# Patient Record
Sex: Male | Born: 1945 | Race: White | Hispanic: No | Marital: Single | Smoking: Current every day smoker
Health system: Southern US, Community
[De-identification: ages and names within clinical notes are randomized; demographics above are authoritative.]

## PROBLEM LIST (undated history)

## (undated) DIAGNOSIS — K219 Gastro-esophageal reflux disease without esophagitis: Secondary | ICD-10-CM

## (undated) DIAGNOSIS — E785 Hyperlipidemia, unspecified: Secondary | ICD-10-CM

## (undated) DIAGNOSIS — I509 Heart failure, unspecified: Secondary | ICD-10-CM

## (undated) DIAGNOSIS — K259 Gastric ulcer, unspecified as acute or chronic, without hemorrhage or perforation: Secondary | ICD-10-CM

## (undated) DIAGNOSIS — I6529 Occlusion and stenosis of unspecified carotid artery: Secondary | ICD-10-CM

## (undated) DIAGNOSIS — K279 Peptic ulcer, site unspecified, unspecified as acute or chronic, without hemorrhage or perforation: Secondary | ICD-10-CM

## (undated) DIAGNOSIS — J302 Other seasonal allergic rhinitis: Secondary | ICD-10-CM

## (undated) DIAGNOSIS — E119 Type 2 diabetes mellitus without complications: Secondary | ICD-10-CM

## (undated) DIAGNOSIS — I2510 Atherosclerotic heart disease of native coronary artery without angina pectoris: Secondary | ICD-10-CM

## (undated) DIAGNOSIS — M1990 Unspecified osteoarthritis, unspecified site: Secondary | ICD-10-CM

## (undated) DIAGNOSIS — I10 Essential (primary) hypertension: Secondary | ICD-10-CM

## (undated) DIAGNOSIS — F1911 Other psychoactive substance abuse, in remission: Secondary | ICD-10-CM

## (undated) HISTORY — PX: FEMUR FRACTURE SURGERY: SHX633

## (undated) HISTORY — PX: ANTRECTOMY: SHX5722

## (undated) HISTORY — DX: Occlusion and stenosis of unspecified carotid artery: I65.29

## (undated) HISTORY — PX: FRACTURE SURGERY: SHX138

---

## 1970-11-30 DIAGNOSIS — K259 Gastric ulcer, unspecified as acute or chronic, without hemorrhage or perforation: Secondary | ICD-10-CM

## 1970-11-30 HISTORY — DX: Gastric ulcer, unspecified as acute or chronic, without hemorrhage or perforation: K25.9

## 2000-11-30 HISTORY — PX: CORONARY ARTERY BYPASS GRAFT: SHX141

## 2001-04-20 ENCOUNTER — Encounter: Payer: Self-pay | Admitting: Internal Medicine

## 2001-04-20 ENCOUNTER — Inpatient Hospital Stay (HOSPITAL_COMMUNITY): Admission: RE | Admit: 2001-04-20 | Discharge: 2001-04-25 | Payer: Self-pay | Admitting: Internal Medicine

## 2001-04-21 ENCOUNTER — Encounter: Payer: Self-pay | Admitting: Thoracic Surgery (Cardiothoracic Vascular Surgery)

## 2001-04-22 ENCOUNTER — Encounter: Payer: Self-pay | Admitting: Thoracic Surgery (Cardiothoracic Vascular Surgery)

## 2001-04-23 ENCOUNTER — Encounter: Payer: Self-pay | Admitting: Thoracic Surgery (Cardiothoracic Vascular Surgery)

## 2004-11-26 ENCOUNTER — Ambulatory Visit: Payer: Self-pay | Admitting: Family Medicine

## 2004-11-28 ENCOUNTER — Emergency Department (HOSPITAL_COMMUNITY): Admission: EM | Admit: 2004-11-28 | Discharge: 2004-11-28 | Payer: Self-pay | Admitting: Emergency Medicine

## 2006-04-28 ENCOUNTER — Ambulatory Visit: Payer: Self-pay | Admitting: Family Medicine

## 2007-03-16 ENCOUNTER — Ambulatory Visit: Payer: Self-pay | Admitting: Family Medicine

## 2010-06-30 HISTORY — PX: CHOLECYSTECTOMY: SHX55

## 2010-07-20 ENCOUNTER — Encounter (INDEPENDENT_AMBULATORY_CARE_PROVIDER_SITE_OTHER): Payer: Self-pay | Admitting: General Surgery

## 2010-07-20 ENCOUNTER — Inpatient Hospital Stay (HOSPITAL_COMMUNITY)
Admission: EM | Admit: 2010-07-20 | Discharge: 2010-07-21 | Payer: Self-pay | Source: Home / Self Care | Admitting: Emergency Medicine

## 2011-02-12 LAB — CBC
MCHC: 34.5 g/dL (ref 30.0–36.0)
MCV: 86.4 fL (ref 78.0–100.0)
Platelets: 172 10*3/uL (ref 150–400)
RBC: 5.37 MIL/uL (ref 4.22–5.81)
RDW: 15 % (ref 11.5–15.5)

## 2011-02-12 LAB — COMPREHENSIVE METABOLIC PANEL
ALT: 39 U/L (ref 0–53)
Albumin: 3.6 g/dL (ref 3.5–5.2)
BUN: 12 mg/dL (ref 6–23)
Calcium: 9.2 mg/dL (ref 8.4–10.5)
Glucose, Bld: 131 mg/dL — ABNORMAL HIGH (ref 70–99)
Potassium: 4 mEq/L (ref 3.5–5.1)
Sodium: 139 mEq/L (ref 135–145)
Total Bilirubin: 1 mg/dL (ref 0.3–1.2)
Total Protein: 6.3 g/dL (ref 6.0–8.3)

## 2011-02-12 LAB — URINALYSIS, ROUTINE W REFLEX MICROSCOPIC
Nitrite: NEGATIVE
Specific Gravity, Urine: 1.019 (ref 1.005–1.030)
pH: 6 (ref 5.0–8.0)

## 2011-02-12 LAB — DIFFERENTIAL
Lymphs Abs: 0.9 10*3/uL (ref 0.7–4.0)
Monocytes Relative: 5 % (ref 3–12)

## 2011-02-12 LAB — GLUCOSE, CAPILLARY
Glucose-Capillary: 146 mg/dL — ABNORMAL HIGH (ref 70–99)
Glucose-Capillary: 198 mg/dL — ABNORMAL HIGH (ref 70–99)

## 2011-04-17 NOTE — Discharge Summary (Signed)
[place]. [facility]  Patient:    Aaron Santiago, Aaron Santiago                       MRN: [id number] Adm. Date:  [id number] Disc. Date: [id number] Attending:  Tressie Stalker Dictator:   Maxwell Marion, RNFA CC:         Aaron Santiago, D.O.  Aaron Santiago, M.D. [facility]   Discharge Summary  DATE OF BIRTH:  December 30, 1945.  On the morning of Apr 24, 2001, Aaron Santiago temperature was noted to be 101.2.  The discharge was held due to his fever.  A urinalysis sample was obtained. The result was negative.  After examination by Alleen Borne, M.D., he felt his fever was probably due to pericarditis.  He had a pleura postoperatively with ST elevations.  No rub was heard on Apr 24, 2001.  He is planning on a short course of Indocin, that will be for three days; and plan is for discharge home on Apr 25, 2001. The morning of Apr 25, 2001, he is noted to be afebrile at 97.5.  PHYSICAL EXAMINATION:  His heart is regular rate and rhythm.  His lungs are clear.  His incision is healing well.  Postoperative instructions were reviewed including discussions about smoking cessation.  He was discharged home on Apr 25, 2001. DD:  04/25/01 TD:  04/25/01 Job: [id number]

## 2011-04-17 NOTE — Cardiovascular Report (Signed)
[place]. [facility]  Patient:    Aaron Santiago, Aaron Santiago                       MRN: [id number] Proc. Date: 04/20/01 Adm. Date:  [id number] Attending:  Lewayne Bunting CC:         Doylene Canning. Ladona Ridgel, M.D. [facility]  Colon Flattery, D.O.  Cardiac Catheterization Laboratory   Cardiac Catheterization  INDICATIONS:  Aaron Santiago is a 65 year old male who presents with onset of exertion-type chest discomfort.  The symptoms are fairly typical.  The patient has insulin dependent diabetes.  The current study is done to assess coronary anatomy.  PROCEDURES: 1. Left heart catheterization. 2. Selective coronary arteriography. 3. Selective left ventriculography. 4. Subclavian angiography.  DESCRIPTION OF PROCEDURE:  The procedure was performed from the right femoral artery using 6-French catheters.  He tolerated the procedure well without complications.  HEMODYNAMIC DATA:  The central aortic pressure is 133/76.  LV pressure is 127/10.  There was no gradient on pullback across the aortic valve.  ANGIOGRAPHIC DATA: 1. Ventriculography was performed in the RAO projection.  No segmental    abnormality or contraction were identified.  There was a lot of ventricular    ectopy so the ejection fraction could not be calculated, but it would    appear to be greater than 55%. 2. Subclavian angiography reveals a widely patent subclavian.  The internal    mammary is also patent.  Of note, there does appear to be tight narrowing    of the vertebral of perhaps 90%. 3. The left main coronary artery is free of critical disease. 4. The LAD demonstrates calcification in the mid portion.  There is about    30% tapered narrowing at the bifurcation of the diagonal and just beyond    this there is a 75% to 80% segmental plaquing near the area of    calcification.  This is somewhat segmental.  The distal LAD is a large    caliber vessel, being about 3 mm, and it wraps the apex.  The diagonal    itself has  some mild irregularity, but no high-grade stenosis. 5. The circumflex proper provides two marginal branches.  There is perhaps    minimal luminal irregularity, but no high-grade disease. 6. The right coronary artery demonstrates about 30% to 40% narrowing in the    mid bend.  There is also perhaps 30% narrowing proximally.  Neither of    these appear to be high-grade.  The posterior descending and posterolateral    branches are free of critical disease.  CONCLUSIONS: 1. Normal left ventricular function. 2. Patency of the subclavian and internal mammary artery with stenosis of    the vertebral artery, as noted. 3. Moderately high-grade stenosis and segmental narrowing of the left anterior    descending coronary artery, as noted above.  DISPOSITION:  The LAD itself is not ideal for stenting.  It overlaps the diagonal branch.  The likelihood of diagonal occlusion would be somewhat increased.  This could be approached with ablative technique including rotational atherectomy, but the risk of target vessel revascularization would be high and the distal LAD is a very large vessel and in an insulin dependent diabetic.  Because of the increased risk of target vessel revascularization, some consideration might be given towards minimally invasive surgery.  I will discuss the RCA disease with the surgeons. DD:  04/20/01 TD:  04/20/01 Job: [id number]

## 2011-04-17 NOTE — Op Note (Signed)
[place]. [facility]  Patient:    Aaron Santiago, Aaron Santiago                       MRN: [id number] Proc. Date: 04/21/01 Adm. Date:  [id number] Attending:  Tressie Stalker CC:         Arturo Morton. Riley Kill, M.D. [facility]  Daisey Must, M.D. [facility]  Colon Flattery, D.O.  CVTS Office   Operative Report  PREOPERATIVE DIAGNOSIS:  Severe one vessel coronary artery disease with class III unstable angina.  POSTOPERATIVE DIAGNOSIS:  Severe one vessel coronary artery disease with class III unstable angina.  OPERATION:  Median sternotomy for off-pump coronary artery bypass times one (left internal mammary artery to distal left anterior descending coronary artery).  SURGEON:  Salvatore Decent. Cornelius Moras, M.D.  ASSISTANT:  Mikey Bussing, M.D.  ANESTHESIA:  General  BRIEF CLINICAL NOTE:  The patient is a 65 year old white male with type 2 diabetes mellitus followed by Dr. Colon Flattery and referred by Dr. Shawnie Pons for management of coronary artery disease.  The patient presents with new onset symptoms of angina and cardiac catheterization demonstrated severe one vessel coronary artery disease with normal left ventricular function. There is high stenosis of the left anterior descending coronary artery involving a complicated calcified plaque immediately after take off of the large diagonal branch.  This is felt to be not optimal for percutaneous based therapy.  There is insignificant nonflow limiting disease in the right coronary and left circumflex system.  A full consultation note has been dictated previously.  OPERATIVE CONSENT:  The patient and his family are counselled at length regarding the indications and potential benefits of coronary artery bypass grafting.  They understand the associated risks of surgery including but not limited to risks of death, stroke, myocardial infarction, bleeding requiring blood transfusion, arrhythmia, infection, and recurrent coronary  artery disease.  They accept these risks as well as unforeseen complications and desire to proceed with surgery as described.  DESCRIPTION OF PROCEDURE:  The patient is brought to the operating room on the above mentioned date and invasive hemodynamic monitoring is established by the anesthesia service under the care and direction of Dr. Edwin Cap. Zoila Shutter. Intravenous antibiotics are administered.  Following induction with general endotracheal anesthesia, the patients chest, abdomen, both groins, and both lower extremities are prepared and draped in a sterile manner.  A median sternotomy incision is performed and the left internal mammary artery is dissected from the chest wall and prepared for bypass grafting.  The left internal mammary artery is felt to be a good quality conduit.  The patient is heparinized systemically.  The pericardium is opened.  The patient is placed in steep Trendelenburg position with the table turned towards the patients right side.  Two deep pericardial sutures are placed along the pericardial reflection on the left side to elevate the heart into the operative field.  The left anterior descending coronary artery is brought clearly into view.  A stabilization device ([employer]) is utilized to facilitate beating heart surgery.  The left internal mammary artery is trimmed to appropriate length.  The stabilization device is positioned appropriately and the patient tolerates it well with insignificant changes in hemodynamics.  Distal anastomosis is performed in an end to side fashion between the left internal mammary artery and the distal left anterior descending coronary artery.  This coronary measures 2 mm in diameter and is of good quality.  The anastomosis is completed uneventfully. Following completion of  the anastomosis the stabilization device is removed. A sterile Doppler device is utilized to verify excellent flow within the left internal mammary artery  graft.  Protamine is administered to reverse the anticoagulation.  The mediastinum and the left chest are irrigated with saline solution containing Vancomycin. Meticulous surgical hemostasis is ascertained.  The mediastinum and the left chest are drained with two chest tubes placed through separate stab incisions inferiorly.  The median sternotomy is closed in a routine fashion.  The skin incision is closed with a subcuticular skin closure.  The patient tolerated the procedure well and was transported to the surgical intensive care unit in stable condition.  There were no intraoperative complications.  All sponge, instrument and needle counts are verified correct at the completion of the operation.  No autologous blood products were administered. DD:  04/21/01 TD:  04/22/01 Job: [id number]
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
## 2011-10-31 DIAGNOSIS — I6529 Occlusion and stenosis of unspecified carotid artery: Secondary | ICD-10-CM | POA: Insufficient documentation

## 2011-11-05 ENCOUNTER — Inpatient Hospital Stay (HOSPITAL_COMMUNITY)
Admission: EM | Admit: 2011-11-05 | Discharge: 2011-11-07 | DRG: 247 | Disposition: A | Discharge status: Home / Self Care | Payer: Medicare Other | Attending: Cardiology | Admitting: Cardiology

## 2011-11-05 ENCOUNTER — Emergency Department (HOSPITAL_COMMUNITY): Payer: Medicare Other

## 2011-11-05 ENCOUNTER — Other Ambulatory Visit: Payer: Self-pay

## 2011-11-05 ENCOUNTER — Encounter: Payer: Self-pay | Admitting: Emergency Medicine

## 2011-11-05 DIAGNOSIS — Z823 Family history of stroke: Secondary | ICD-10-CM

## 2011-11-05 DIAGNOSIS — I4729 Other ventricular tachycardia: Secondary | ICD-10-CM | POA: Diagnosis not present

## 2011-11-05 DIAGNOSIS — F1411 Cocaine abuse, in remission: Secondary | ICD-10-CM | POA: Diagnosis present

## 2011-11-05 DIAGNOSIS — Z720 Tobacco use: Secondary | ICD-10-CM | POA: Diagnosis present

## 2011-11-05 DIAGNOSIS — I472 Ventricular tachycardia, unspecified: Secondary | ICD-10-CM | POA: Diagnosis not present

## 2011-11-05 DIAGNOSIS — Z794 Long term (current) use of insulin: Secondary | ICD-10-CM | POA: Diagnosis present

## 2011-11-05 DIAGNOSIS — I200 Unstable angina: Secondary | ICD-10-CM | POA: Diagnosis present

## 2011-11-05 DIAGNOSIS — I169 Hypertensive crisis, unspecified: Secondary | ICD-10-CM

## 2011-11-05 DIAGNOSIS — E7800 Pure hypercholesterolemia, unspecified: Secondary | ICD-10-CM | POA: Diagnosis present

## 2011-11-05 DIAGNOSIS — Z7982 Long term (current) use of aspirin: Secondary | ICD-10-CM

## 2011-11-05 DIAGNOSIS — K279 Peptic ulcer, site unspecified, unspecified as acute or chronic, without hemorrhage or perforation: Secondary | ICD-10-CM | POA: Diagnosis present

## 2011-11-05 DIAGNOSIS — E785 Hyperlipidemia, unspecified: Secondary | ICD-10-CM | POA: Diagnosis present

## 2011-11-05 DIAGNOSIS — Z951 Presence of aortocoronary bypass graft: Secondary | ICD-10-CM

## 2011-11-05 DIAGNOSIS — Z8249 Family history of ischemic heart disease and other diseases of the circulatory system: Secondary | ICD-10-CM

## 2011-11-05 DIAGNOSIS — F1011 Alcohol abuse, in remission: Secondary | ICD-10-CM | POA: Diagnosis present

## 2011-11-05 DIAGNOSIS — I25110 Atherosclerotic heart disease of native coronary artery with unstable angina pectoris: Secondary | ICD-10-CM | POA: Diagnosis present

## 2011-11-05 DIAGNOSIS — I119 Hypertensive heart disease without heart failure: Secondary | ICD-10-CM | POA: Diagnosis present

## 2011-11-05 DIAGNOSIS — E119 Type 2 diabetes mellitus without complications: Secondary | ICD-10-CM | POA: Diagnosis present

## 2011-11-05 DIAGNOSIS — R079 Chest pain, unspecified: Secondary | ICD-10-CM | POA: Diagnosis present

## 2011-11-05 DIAGNOSIS — Z79899 Other long term (current) drug therapy: Secondary | ICD-10-CM

## 2011-11-05 DIAGNOSIS — F17200 Nicotine dependence, unspecified, uncomplicated: Secondary | ICD-10-CM | POA: Diagnosis present

## 2011-11-05 DIAGNOSIS — I2510 Atherosclerotic heart disease of native coronary artery without angina pectoris: Principal | ICD-10-CM | POA: Diagnosis present

## 2011-11-05 DIAGNOSIS — Z7902 Long term (current) use of antithrombotics/antiplatelets: Secondary | ICD-10-CM

## 2011-11-05 DIAGNOSIS — E1111 Type 2 diabetes mellitus with ketoacidosis with coma: Secondary | ICD-10-CM | POA: Diagnosis present

## 2011-11-05 DIAGNOSIS — I10 Essential (primary) hypertension: Secondary | ICD-10-CM | POA: Diagnosis present

## 2011-11-05 DIAGNOSIS — J069 Acute upper respiratory infection, unspecified: Secondary | ICD-10-CM | POA: Diagnosis present

## 2011-11-05 HISTORY — DX: Gastro-esophageal reflux disease without esophagitis: K21.9

## 2011-11-05 HISTORY — DX: Unspecified osteoarthritis, unspecified site: M19.90

## 2011-11-05 HISTORY — DX: Gastric ulcer, unspecified as acute or chronic, without hemorrhage or perforation: K25.9

## 2011-11-05 HISTORY — DX: Other psychoactive substance abuse, in remission: F19.11

## 2011-11-05 HISTORY — DX: Peptic ulcer, site unspecified, unspecified as acute or chronic, without hemorrhage or perforation: K27.9

## 2011-11-05 LAB — CARDIAC PANEL(CRET KIN+CKTOT+MB+TROPI)
Relative Index: 3.6 — ABNORMAL HIGH (ref 0.0–2.5)
Relative Index: INVALID (ref 0.0–2.5)
Total CK: 138 U/L (ref 7–232)
Total CK: 90 U/L (ref 7–232)
Troponin I: <0.3 ng/mL (ref <0.30)

## 2011-11-05 LAB — DIFFERENTIAL
Eosinophils Absolute: 0.2 10*3/uL (ref 0.0–0.7)
Lymphocytes Relative: 21 % (ref 12–46)
Lymphs Abs: 1.4 10*3/uL (ref 0.7–4.0)
Monocytes Absolute: 0.6 10*3/uL (ref 0.1–1.0)
Neutro Abs: 4.3 10*3/uL (ref 1.7–7.7)
Neutrophils Relative %: 66 % (ref 43–77)

## 2011-11-05 LAB — COMPREHENSIVE METABOLIC PANEL
Albumin: 3.6 g/dL (ref 3.5–5.2)
BUN: 11 mg/dL (ref 6–23)
CO2: 28 mEq/L (ref 19–32)
Calcium: 9.2 mg/dL (ref 8.4–10.5)
GFR calc non Af Amer: 88 mL/min — ABNORMAL LOW (ref >90)
Potassium: 3.8 mEq/L (ref 3.5–5.1)
Sodium: 135 mEq/L (ref 135–145)
Total Bilirubin: 0.5 mg/dL (ref 0.3–1.2)

## 2011-11-05 LAB — URINALYSIS, ROUTINE W REFLEX MICROSCOPIC
Bilirubin Urine: NEGATIVE
Glucose, UA: NEGATIVE mg/dL
Protein, ur: NEGATIVE mg/dL
Urobilinogen, UA: 0.2 mg/dL (ref 0.0–1.0)
pH: 7 (ref 5.0–8.0)

## 2011-11-05 LAB — POCT I-STAT TROPONIN I

## 2011-11-05 LAB — CBC
HCT: 45.9 % (ref 39.0–52.0)
MCV: 84.5 fL (ref 78.0–100.0)
Platelets: 178 10*3/uL (ref 150–400)
RBC: 5.43 MIL/uL (ref 4.22–5.81)
RDW: 14.3 % (ref 11.5–15.5)
WBC: 6.6 10*3/uL (ref 4.0–10.5)

## 2011-11-05 LAB — GLUCOSE, CAPILLARY
Glucose-Capillary: 318 mg/dL — ABNORMAL HIGH (ref 70–99)
Glucose-Capillary: 304 mg/dL — ABNORMAL HIGH (ref 70–99)

## 2011-11-05 LAB — PROTIME-INR
INR: 1.08 (ref 0.00–1.49)
Prothrombin Time: 14.2 seconds (ref 11.6–15.2)

## 2011-11-05 MED ORDER — ALPRAZOLAM 0.25 MG PO TABS
0.2500 mg | ORAL_TABLET | Freq: Two times a day (BID) | ORAL | Status: DC | PRN
  Administered 2011-11-07: 0.25 mg via ORAL
  Filled 2011-11-05: qty 1

## 2011-11-05 MED ORDER — METOPROLOL TARTRATE 25 MG PO TABS
25.0000 mg | ORAL_TABLET | Freq: Four times a day (QID) | ORAL | Status: DC
  Administered 2011-11-05 – 2011-11-07 (×5): 25 mg via ORAL
  Filled 2011-11-05 (×13): qty 1

## 2011-11-05 MED ORDER — NITROGLYCERIN 0.4 MG SL SUBL: 0.4000 mg | SUBLINGUAL_TABLET | SUBLINGUAL | Status: DC | PRN

## 2011-11-05 MED ORDER — LISINOPRIL 10 MG PO TABS
10.0000 mg | ORAL_TABLET | Freq: Every day | ORAL | Status: DC
  Administered 2011-11-05 – 2011-11-07 (×3): 10 mg via ORAL
  Filled 2011-11-05 (×3): qty 1

## 2011-11-05 MED ORDER — SODIUM CHLORIDE 0.9 % IJ SOLN
3.0000 mL | Freq: Two times a day (BID) | INTRAMUSCULAR | Status: DC
  Administered 2011-11-05: 3 mL via INTRAVENOUS

## 2011-11-05 MED ORDER — SODIUM CHLORIDE 0.9 % IV SOLN: INTRAVENOUS | Status: DC

## 2011-11-05 MED ORDER — INSULIN DETEMIR 100 UNIT/ML ~~LOC~~ SOLN
30.0000 [IU] | Freq: Every day | SUBCUTANEOUS | Status: DC
  Administered 2011-11-07: 30 [IU] via SUBCUTANEOUS
  Filled 2011-11-05: qty 3

## 2011-11-05 MED ORDER — OMEGA-3 FATTY ACIDS 1000 MG PO CAPS
2.0000 g | ORAL_CAPSULE | Freq: Every day | ORAL | Status: DC
  Administered 2011-11-05 – 2011-11-07 (×2): 2 g via ORAL
  Filled 2011-11-05 (×3): qty 2

## 2011-11-05 MED ORDER — VITAMIN E 45 MG (100 UNIT) PO CAPS
1000.0000 [IU] | ORAL_CAPSULE | Freq: Every day | ORAL | Status: DC
  Administered 2011-11-05 – 2011-11-07 (×2): 1000 [IU] via ORAL
  Filled 2011-11-05 (×3): qty 2

## 2011-11-05 MED ORDER — VITAMIN B-1 100 MG PO TABS
100.0000 mg | ORAL_TABLET | Freq: Every day | ORAL | Status: DC
  Administered 2011-11-05 – 2011-11-07 (×2): 100 mg via ORAL
  Filled 2011-11-05 (×3): qty 1

## 2011-11-05 MED ORDER — INSULIN ASPART 100 UNIT/ML ~~LOC~~ SOLN
0.0000 [IU] | Freq: Every day | SUBCUTANEOUS | Status: DC
  Administered 2011-11-06: 3 [IU] via SUBCUTANEOUS

## 2011-11-05 MED ORDER — HEPARIN BOLUS VIA INFUSION
4000.0000 [IU] | Freq: Once | INTRAVENOUS | Status: AC
  Administered 2011-11-05: 4000 [IU] via INTRAVENOUS
  Filled 2011-11-05: qty 4000

## 2011-11-05 MED ORDER — VITAMIN B-6 100 MG PO TABS: 500.0000 mg | ORAL_TABLET | Freq: Every day | ORAL | Status: DC

## 2011-11-05 MED ORDER — NITROGLYCERIN IN D5W 200-5 MCG/ML-% IV SOLN
5.0000 ug/min | Freq: Once | INTRAVENOUS | Status: AC
  Administered 2011-11-05: 5 ug/min via INTRAVENOUS
  Filled 2011-11-05: qty 250

## 2011-11-05 MED ORDER — SODIUM CHLORIDE 0.9 % IV SOLN: 250.0000 mL | INTRAVENOUS | Status: DC | PRN

## 2011-11-05 MED ORDER — ASPIRIN EC 81 MG PO TBEC
81.0000 mg | DELAYED_RELEASE_TABLET | Freq: Every day | ORAL | Status: DC
  Administered 2011-11-07: 81 mg via ORAL
  Filled 2011-11-05 (×2): qty 1

## 2011-11-05 MED ORDER — ACETAMINOPHEN 325 MG PO TABS
650.0000 mg | ORAL_TABLET | ORAL | Status: DC | PRN
  Administered 2011-11-06 – 2011-11-07 (×2): 650 mg via ORAL
  Filled 2011-11-05 (×2): qty 2

## 2011-11-05 MED ORDER — INSULIN ASPART 100 UNIT/ML ~~LOC~~ SOLN
0.0000 [IU] | Freq: Three times a day (TID) | SUBCUTANEOUS | Status: DC
  Administered 2011-11-06 – 2011-11-07 (×2): 3 [IU] via SUBCUTANEOUS
  Filled 2011-11-05: qty 3

## 2011-11-05 MED ORDER — FOLIC ACID 400 MCG PO TABS: 400.0000 ug | ORAL_TABLET | Freq: Every day | ORAL | Status: DC

## 2011-11-05 MED ORDER — VITAMIN B-6 100 MG PO TABS
500.0000 mg | ORAL_TABLET | Freq: Every day | ORAL | Status: DC
  Administered 2011-11-05 – 2011-11-07 (×2): 500 mg via ORAL
  Filled 2011-11-05 (×3): qty 5

## 2011-11-05 MED ORDER — ONDANSETRON HCL 4 MG/2ML IJ SOLN: 4.0000 mg | Freq: Four times a day (QID) | INTRAMUSCULAR | Status: DC | PRN

## 2011-11-05 MED ORDER — SODIUM CHLORIDE 0.9 % IJ SOLN: 3.0000 mL | INTRAMUSCULAR | Status: DC | PRN

## 2011-11-05 MED ORDER — LABETALOL HCL 5 MG/ML IV SOLN
20.0000 mg | Freq: Once | INTRAVENOUS | Status: AC
  Administered 2011-11-05: 20 mg via INTRAVENOUS
  Filled 2011-11-05: qty 4

## 2011-11-05 MED ORDER — HYPROMELLOSE (GONIOSCOPIC) 2.5 % OP SOLN: 1.0000 [drp] | Freq: Four times a day (QID) | OPHTHALMIC | Status: DC | PRN

## 2011-11-05 MED ORDER — ASPIRIN 81 MG PO CHEW
324.0000 mg | CHEWABLE_TABLET | Freq: Once | ORAL | Status: AC
  Administered 2011-11-05: 324 mg via ORAL
  Filled 2011-11-05: qty 4

## 2011-11-05 MED ORDER — NITROGLYCERIN IN D5W 200-5 MCG/ML-% IV SOLN
20.0000 ug/min | INTRAVENOUS | Status: DC
  Filled 2011-11-05: qty 250

## 2011-11-05 MED ORDER — ROSUVASTATIN CALCIUM 40 MG PO TABS
40.0000 mg | ORAL_TABLET | Freq: Every day | ORAL | Status: DC
  Administered 2011-11-05 – 2011-11-06 (×2): 40 mg via ORAL
  Filled 2011-11-05 (×3): qty 1

## 2011-11-05 MED ORDER — ASPIRIN 81 MG PO CHEW: 324.0000 mg | CHEWABLE_TABLET | ORAL | Status: DC

## 2011-11-05 MED ORDER — HEPARIN SOD (PORCINE) IN D5W 100 UNIT/ML IV SOLN
1200.0000 [IU]/h | INTRAVENOUS | Status: DC
  Administered 2011-11-05: 900 [IU]/h via INTRAVENOUS
  Filled 2011-11-05 (×3): qty 250

## 2011-11-05 MED ORDER — VITAMIN C 500 MG PO TABS
1000.0000 mg | ORAL_TABLET | Freq: Every day | ORAL | Status: DC
  Administered 2011-11-05 – 2011-11-07 (×2): 1000 mg via ORAL
  Filled 2011-11-05 (×3): qty 2

## 2011-11-05 MED ORDER — ZOLPIDEM TARTRATE 5 MG PO TABS: 5.0000 mg | ORAL_TABLET | Freq: Every evening | ORAL | Status: DC | PRN

## 2011-11-05 MED ORDER — DIAZEPAM 5 MG PO TABS
5.0000 mg | ORAL_TABLET | ORAL | Status: AC
  Administered 2011-11-06: 5 mg via ORAL
  Filled 2011-11-05: qty 1

## 2011-11-05 MED ORDER — FOLIC ACID 0.5 MG HALF TAB
0.5000 mg | ORAL_TABLET | Freq: Every day | ORAL | Status: DC
  Administered 2011-11-05 – 2011-11-07 (×2): 0.5 mg via ORAL
  Filled 2011-11-05 (×3): qty 1

## 2011-11-05 NOTE — Progress Notes (Signed)
ANTICOAGULATION CONSULT NOTE - Follow Up Consult  Pharmacy Consult for Heparin Indication: chest pain/ACS  No Known Allergies  Patient Measurements: Height: 6' (182.9 cm) Weight: 169 lb 8 oz (76.885 kg) IBW/kg (Calculated) : 77.6   Vital Signs: Temp: 98.4 F (36.9 C) (12/06 2100) Temp src: Oral (12/06 1623) BP: 121/69 mmHg (12/06 2100) Pulse Rate: 77  (12/06 2100)  Labs:  Basename 11/05/11 2155 11/05/11 1545 11/05/11 1540 11/05/11 0811 11/05/11 0810  HGB -- -- -- 16.0 --  HCT -- -- -- 45.9 --  PLT -- -- -- 178 --  APTT -- >200* -- -- --  LABPROT -- 14.2 -- -- --  INR -- 1.08 -- -- --  HEPARINUNFRC 0.36 -- -- -- --  CREATININE -- -- -- 0.88 --  CKTOTAL -- -- 90 -- 138  CKMB -- -- 3.8 -- 4.9*  TROPONINI -- -- <0.30 -- <0.30   Estimated Creatinine Clearance: 91 ml/min (by C-G formula based on Cr of 0.88).    Assessment/Plan:  65yo male with ACS, on heparin 900 units/hr with therapeutic HL 0.36.  No bleeding problems noted.  Elevated PTT> 200 earlier this evening- drawn after heparing initiated.  Will continue current rate and f/u in AM.  Annick Dimaio P 11/05/2011,10:43 PM

## 2011-11-05 NOTE — ED Notes (Signed)
Pt placed in gown, on monitor with continuous blood pressure and pulse oximetry 

## 2011-11-05 NOTE — ED Notes (Signed)
Pt states has had ringing in his ears for over a month and has been taking his blood pressure at home and has been elevated for over a month running near 200/100.

## 2011-11-05 NOTE — ED Provider Notes (Signed)
History     CSN: [id number] Arrival date & time: 11/05/2011  7:29 AM   First MD Initiated Contact with Patient 11/05/11 0740      Chief Complaint  Patient presents with  . Hypertension    states bp has been elevated for over a month but has been trying to decrease it with diet. states is not taking bp med. states has been having ringing in his ears.    (Consider location/radiation/quality/duration/timing/severity/associated sxs/prior treatment) HPI Comments: Patient is a 65 year old man who has a prior history of diabetes, coronary artery disease with CABG 10 years ago, and cholecystectomy this spring. He says that his blood pressure has been elevated for the past month. He has been trying to deal with this with diet, without success. He has an appointment with his family physician, Joette Catching, M.D. on December 24.  This morning he he had blood pressure 210/110, he had a pressure feeling in his chest, his ears were ringing and he felt flushed. Therefore he sought evaluation.  Patient is a 65 y.o. male presenting with hypertension.  Hypertension This is a new problem. The current episode started more than 1 week ago. The problem occurs constantly. The problem has been gradually worsening. Associated symptoms include chest pain. The symptoms are aggravated by nothing. The symptoms are relieved by nothing. Treatments tried: Attempted to control his blood pressure with diet.    Past Medical History  Diagnosis Date  . Hypertension   . High cholesterol   . Coronary artery disease     Past Surgical History  Procedure Date  . Cardiac surgery     10 years ago had cabg x 1 vessel  . Cholecystectomy     History reviewed. No pertinent family history.  History  Substance Use Topics  . Smoking status: Current Everyday Smoker -- 1.0 packs/day  . Smokeless tobacco: Not on file  . Alcohol Use: No      Review of Systems  Constitutional: Negative.   HENT:       He had facial  flushing this morning.  Eyes: Negative.   Respiratory: Positive for chest tightness.   Cardiovascular: Positive for chest pain.       His blood pressure is been elevated for one month.  Gastrointestinal: Negative.   Genitourinary:       He has urinary hesitancy at times.  Musculoskeletal: Negative.   Neurological: Negative.   Psychiatric/Behavioral: Negative.     Allergies  Review of patient's allergies indicates no known allergies.  Home Medications   Current Outpatient Rx  Name Route Sig Dispense Refill  . VITAMIN C 1000 MG PO TABS Oral Take 1,000 mg by mouth daily.      . ASPIRIN EC 81 MG PO TBEC Oral Take 81 mg by mouth daily.      . ATORVASTATIN CALCIUM 10 MG PO TABS Oral Take 10 mg by mouth at bedtime.      . COD LIVER OIL PO CAPS Oral Take by mouth.      . OMEGA-3 FATTY ACIDS 1000 MG PO CAPS Oral Take 2 g by mouth daily.      Marland Kitchen FOLIC ACID 400 MCG PO TABS Oral Take 400 mcg by mouth daily.      Marland Kitchen HYPROMELLOSE 2.5 % OP SOLN Both Eyes Place 1 drop into both eyes 4 (four) times daily as needed.      . INSULIN DETEMIR 100 UNIT/ML [place] SOLN Subcutaneous Inject 30 Units into the skin 1 day or 1  dose. Before breakfast     . VITAMIN B-6 500 MG PO TABS Oral Take 500 mg by mouth daily.      Marland Kitchen VITAMIN B-1 100 MG PO TABS Oral Take by mouth daily.      Marland Kitchen VITAMIN E 1000 UNITS PO CAPS Oral Take 1,000 Units by mouth daily.        BP 129/69  Pulse 68  Temp(Src) 98 F (36.7 C) (Oral)  Resp 21  SpO2 94%  Physical Exam  Nursing note and vitals reviewed. Constitutional: He is oriented to person, place, and time. He appears well-developed and well-nourished. No distress.  HENT:  Head: Normocephalic and atraumatic.  Right Ear: External ear normal.  Left Ear: External ear normal.  Mouth/Throat: Oropharynx is clear and moist.  Eyes: Conjunctivae and EOM are normal. Pupils are equal, round, and reactive to light.  Neck: Neck supple.  Cardiovascular: Normal rate, regular rhythm and normal  heart sounds.   Pulmonary/Chest:       He has a well-healed median sternotomy.  Abdominal: Soft. Bowel sounds are normal.  Musculoskeletal: Normal range of motion.  Neurological: He is alert and oriented to person, place, and time.       No sensory or motor deficit.  Skin: Skin is warm and dry.  Psychiatric: He has a normal mood and affect. His behavior is normal.    ED Course  Procedures (including critical care time)  2:55 PM  Date: 11/05/2011  Rate: 85  Rhythm: normal sinus rhythm  QRS Axis: normal  Intervals: normal  ST/T Wave abnormalities: normal  Conduction Disutrbances:none  Narrative Interpretation: Normal EKG  Old EKG Reviewed: unchanged  2:55 PM Patient was seen, and physical examination was performed. Laboratory testing was ordered. EKG was nonacute. Aspirin, intravenous nitroglycerin, and intravenous labetalol ordered.   2:55 PM Patient's blood pressures come down very nicely. He feels better. His lab tests were reassuringly normal. I advised him that we will do a three-hour troponin, because of his prior history of coronary artery disease and CABG. If that is good, then I think it would be reasonable for him to be released on blood pressure medications.  2:55 PM Second troponin was negative.  Will ask [facility] to see him as this is the way he felt when he had to have CABG appx 10 years ago.  2:55 PM [facility] will see pt.    2:55 PM Pt will be admitted to [facility].  1. Hypertensive crisis   2. Chest pain           Carleene Cooper III, MD 11/05/11 1455

## 2011-11-05 NOTE — Significant Event (Signed)
CRITICAL VALUE ALERT  Critical value received:  PTT = >200   Date of notification:  11/05/11  Time of notification:  1703  Critical value read back:yes  Nurse who received alert:  Carollee Herter RN  MD notified (1st page):  Dr. Shirlee Latch   Time of first page:  1705  MD notified (2nd page):  Time of second page:  Responding MD:  Dr. Shirlee Latch   Time MD responded:  [phone]

## 2011-11-05 NOTE — ED Notes (Signed)
EKG done on arrival and given to Ignacia Palma, M.D

## 2011-11-05 NOTE — Progress Notes (Signed)
ANTICOAGULATION CONSULT NOTE - Initial Consult  Pharmacy Consult for IV Heparin Indication: chest pain/ACS  No Known Allergies  Patient Measurements: Height: 6' (182.9 cm) (Per patient report) Weight: 170 lb (77.111 kg) (Per patient report) IBW/kg (Calculated) : 77.6  Heparin dosing weight: 74.8 kg  Vital Signs: Temp: 98 F (36.7 C) (12/06 1400) Temp src: Oral (12/06 1400) BP: 129/69 mmHg (12/06 1430) Pulse Rate: 68  (12/06 1430)  Labs:  Basename 11/05/11 0811 11/05/11 0810  HGB 16.0 --  HCT 45.9 --  PLT 178 --  APTT -- --  LABPROT -- --  INR -- --  HEPARINUNFRC -- --  CREATININE 0.88 --  CKTOTAL -- 138  CKMB -- 4.9*  TROPONINI -- <0.30   Estimated Creatinine Clearance: 91.3 ml/min (by C-G formula based on Cr of 0.88).  Medical History: Past Medical History  Diagnosis Date  . Hypertension   . High cholesterol   . Coronary artery disease     Medications:   (Not in a hospital admission) Scheduled:    . aspirin  324 mg Oral Once  . aspirin EC  81 mg Oral Daily  . labetalol  20 mg Intravenous Once  . lisinopril  10 mg Oral Daily  . metoprolol tartrate  25 mg Oral Q6H  . nitroGLYCERIN  5 mcg/min Intravenous Once  . rosuvastatin  40 mg Oral Daily    Assessment: 65 year old male with HTN and chest pain to start IV heparin for rule out ACS/NSTEMI. Patient was on aspirin 81mg  prior to admission but no other blood thinners. Spoke with patient and he is understanding. CBC wnl. No bleeding. Plan for cath in am. LFTs wnl. Albumin 3.6.   Goal of Therapy:  Heparin level 0.3-0.7 units/ml   Plan:  1. Heparin bolus 4000units x1, then drip at 900units/hr (33ml/hr). 2. Heparin level 6hr after rate initiated. 3. Daily heparin level and CBC.   Fayne Norrie 11/05/2011,3:00 PM

## 2011-11-05 NOTE — H&P (Addendum)
Patient ID: Aaron Santiago MRN: [id number], DOB/AGE: [birth year] 65 y.o. Date of Encounter: 11/05/2011  Primary Physician: No primary provider on file. Primary Cardiologist: was Best boy Complaint: hypertensive crisis  HPI: 65 year old male with PMH of CAD s/p CABG in 2002 came to ED today after 52-month history of progressive worsening of chest pressure that ultimately reminded him of the CP he felt prior to his CABG 10 years ago.  Upon presentation to the ER his BP was reported to be 206/112.  He was given ASA, IV labetalol, and placed on a NTG drip which relieved his symptoms and controlled his pressure. Cath report from 2002 showed normal LV function, patent subclavian and internal mammary artery with stenosis of the vertebral artery, moderate high-grade stenosis and segmental narrowing of the LAD. Given his DM he was referred to CT surgery for CABG. He has been without cardiology follow-up since.     Past Medical History  Diagnosis Date  . Hypertension   . High cholesterol    PUD   . Coronary artery disease      Surgical History:  Past Surgical History  Procedure Date  . Cardiac surgery     10 years ago had cabg x 1 vessel   antrectomy approximately 30 years ago for what sounds like  gastric outlet obstruction related to chronic ulcer disease.   . Cholecystectomy     Current facility-administered medications:acetaminophen (TYLENOL) tablet 650 mg, 650 mg, Oral, Q4H PRN, Darrol Jump, PA;  ALPRAZolam (XANAX) tablet 0.25 mg, 0.25 mg, Oral, BID PRN, Joline Salt Barrett, PA;  aspirin chewable tablet 324 mg, 324 mg, Oral, Once, Carleene Cooper III, MD, 324 mg at 11/05/11 0865;  aspirin EC tablet 81 mg, 81 mg, Oral, Daily, Rhonda G. Barrett, PA heparin ADULT infusion 100 units/ml (25000 units/250 ml), 900 Units/hr, Intravenous, Continuous, Jessica Brown Millen, PHARMD;  heparin bolus via infusion 4,000 Units, 4,000 Units, Intravenous, Once, [facility], PHARMD;   labetalol (NORMODYNE,TRANDATE) injection 20 mg, 20 mg, Intravenous, Once, Carleene Cooper III, MD, 20 mg at 11/05/11 0936;  lisinopril (PRINIVIL,ZESTRIL) tablet 10 mg, 10 mg, Oral, Daily, Joline Salt Barrett, PA metoprolol tartrate (LOPRESSOR) tablet 25 mg, 25 mg, Oral, Q6H, Rhonda G. Barrett, PA;  nitroGLYCERIN (NITROSTAT) SL tablet 0.4 mg, 0.4 mg, Sublingual, Q5 min PRN, Joline Salt Barrett, PA;  nitroGLYCERIN 0.2 mg/mL in dextrose 5 % infusion, 5 mcg/min, Intravenous, Once, Carleene Cooper III, MD, Last Rate: 6 mL/hr at 11/05/11 0855, 20 mcg/min at 11/05/11 0855;  ondansetron (ZOFRAN) injection 4 mg, 4 mg, Intravenous, Q6H PRN, Joline Salt Barrett, PA rosuvastatin (CRESTOR) tablet 40 mg, 40 mg, Oral, Daily, Rhonda G. Barrett, PA;  zolpidem (AMBIEN) tablet 5 mg, 5 mg, Oral, QHS PRN, Darrol Jump, PA  Current outpatient prescriptions: Ascorbic Acid (VITAMIN C) 1000 MG tablet, Take 1,000 mg by mouth daily.  , Disp: , Rfl: ;   aspirin EC 81 MG tablet, Take 81 mg by mouth daily.  , Disp: , Rfl: ;   atorvastatin (LIPITOR) 10 MG tablet, Take 10 mg by mouth at bedtime.  , Disp: , Rfl: ;   Cod Liver Oil CAPS, Take by mouth.  , Disp: , Rfl: ;   fish oil-omega-3 fatty acids 1000 MG capsule, Take 2 g by mouth daily.  , Disp: , Rfl:  folic acid (FOLVITE) 400 MCG tablet, Take 400 mcg by mouth daily.  , Disp: , Rfl: ;   hydroxypropyl methylcellulose (ISOPTO TEARS) 2.5 % ophthalmic  solution,as needed.  , Disp: , Rfl: ;   insulin detemir (LEVEMIR) 100 UNIT/ML injection, Inject 30 Units into the skin 1 day  Before breakfast Pyridoxine HCl (VITAMIN B-6) 500 MG tablet, Take 500 mg by mouth daily.  , Disp: , Rfl: ;  thiamine (VITAMIN B-1) 100 MG tablet, Take by mouth daily.  , Disp: , Rfl: ;  vitamin E 1000 UNIT capsule, Take 1,000 Units by mouth daily.  , Disp: , Rfl:   Allergies: No Known Allergies  History   Social History  . Marital Status: Single    Spouse Name: N/A    Number of Children: N/A  . Years of  Education: N/A   Occupational History  . Not on file.   Social History Main Topics  . Smoking status: Current Everyday Smoker -- 1.0 packs/day  . Smokeless tobacco: Not on file.  . Alcohol Use: No  . Drug Use:   . Sexually Active:    Other Topics Concern  . History of alcohol abuse, recovered alcoholic and has not taken any alcoholic  beverages since 1999. He has a history of cocaine use in the remote past but  none since the late 1980s    Social History Narrative  . No narrative on file     Family History: CAD, CVA, HTN. Mother-Alive age 30. HTN, pacemaker. Father-died when pt was child, ETOH+automobile accident.  Review of Systems: General: negative for chills, fever, night sweats or weight changes.  Cardiovascular: Positive for chest pressure, dyspnea on exertion, negative for edema, orthopnea, palpitations, paroxysmal nocturnal dyspnea or shortness of breath Dermatological: negative for rash Respiratory: negative for cough or wheezing Urologic: negative for hematuria Abdominal: negative for nausea, vomiting, diarrhea, bright red blood per rectum, melena, or hematemesis Neurologic: negative for visual changes, syncope, or dizziness All other systems reviewed and are otherwise negative except as noted above.  Labs:   Lab Results  Component Value Date   WBC 6.6 11/05/2011   HGB 16.0 11/05/2011   HCT 45.9 11/05/2011   MCV 84.5 11/05/2011   PLT 178 11/05/2011     Lab 11/05/11 0811  NA 135  K 3.8  CL 98  CO2 28  BUN 11  CREATININE 0.88  CALCIUM 9.2  PROT 6.7  BILITOT 0.5  ALKPHOS 74  ALT 29  AST 25  GLUCOSE 168*    Basename 11/05/11 0810  CKTOTAL 138  CKMB 4.9*  TROPONINI <0.30  Relative Index: 3.6 (H) (ref 0.0-2.5)  No results found for this basename: CHOL,  HDL,  LDLCALC,  TRIG   No results found for this basename: DDIMER    Radiology/Studies:  Dg Chest Port 1 View 11/05/2011  *RADIOLOGY REPORT*  Clinical Data: Chest tightness, high blood  pressure, history of CABG  PORTABLE CHEST - 1 VIEW  Comparison: Portable chest x-ray of 07/20/2010  Findings: No active infiltrate or effusion is seen.  There is slight elevation of the left hemidiaphragm.  The heart is mildly enlarged and stable.  Median sternotomy sutures are noted.  IMPRESSION: No active lung disease.  Slight elevation of the left hemidiaphragm.  Original Report Authenticated By: Juline Patch, M.D.     EKG: NSR, normal axis, good R-wave progression, no Q-waves, no acute ST-segment changes or T-wave inversions.  Unchanged from EKG dated Aug 2011.    Physical Exam: Blood pressure 129/69, pulse 68, temperature 98 F (36.7 C), temperature source Oral, resp. rate 21, SpO2 94.00%. General: Well developed, well nourished, in no acute distress. Head:  Normocephalic, atraumatic, sclera non-icteric, no xanthomas, nares are without discharge.  Neck: Positivefor R carotid bruit. JVD not elevated. Lungs: Clear bilaterally to auscultation without wheezes, rales, or rhonchi. Breathing is unlabored. Heart: RRR with S1 S2. No murmurs, rubs, or gallops appreciated. Abdomen: Soft, non-tender, non-distended with normoactive bowel sounds. No hepatomegaly. No rebound/guarding. No obvious abdominal masses. Msk:  Strength and tone appear normal for age. Extremities: No clubbing or cyanosis. No edema.  Distal pedal pulses are 2+ and equal bilaterally.  +Allen's test bilat wrists. Neuro: Alert and oriented X 3. Moves all extremities spontaneously. Psych:  Responds to questions appropriately with a normal affect.  ASSESSMENT AND PLAN:  1) Hypertensive Urgency: The patient is comfortable and stable after IV labetalol and nitroglycerin drip.  Admit to telemetry and maintain NTG drip at 20 mcg/min.  Add PO Lisinopril and Lopressor. Titrate off NTG drip as PO meds take effect.  2) CAD s/p CABG: Finish cycle of CE's for total of 3 sets.  Obtain 2D echo to eval LV function.  Start ACS heparin drip per  pharmacy.  Plan for diagnostic cath tomorrow given prior history of CAD that has been untreated for last 10 years now in the setting of acute-on-chronic hypertension.  Continue ASA. Add high dose statin. 3) DM: Present on admission.  Takes Levamir 30 units SQ every morning.  Continue insulin therapy.  4) Tobacco abuse: cessation advised, formal teaching and reinforcement required.   SignedBjorn Loser Barrett PA-C 11/05/2011, 2:54 PM  Patient seen with PA, agree with note.  Patient has not seen a cardiologist since CABG in 2002.  He has been on ASA 81, Lipitor 10, and insulin at home.  He continues to smoke.  He has had episodes of central chest tightness on and off for a month.  He woke up last night with chest tightness and has had it on and off today.  It is not exertional.  He checked his BP and found it to be over 200/100 so came to the ER.  He does not take BP medication.  In the ER, ECG was unchanged.  Cardiac enzymes are so far negative.  BP is now controlled on NTG gtt.  1. Chest pain: Atypical but has been present on and off every day for a month and is getting worse. He says it feels like pain before CABG.  ECG is unchanged and cardiac enzymes so far negative.  Chest pain was present when he arrived in ER but is now resolved with NTG gtt and BP control.   - ASA, heparin gtt, NTG gtt, start metoprolol 25 mg q6 hrs and Crestor 40.   - He is 10 years out from CABG and while symptoms are atypical, they are similar to prior to CABG and are present very frequently.  I think best course will be LHC, will plan for the morning.  2.  HTN: Hypertensive urgency with chest pain.  Continue NTG gtt for BP control for now.  Add metoprolol 25 mg q6 hrs and lisinopril 10 mg.  Will need to be on antihypertensives at home.  Can titrate off NTG gtt as other meds come on board. 3.  Smoking: Needs to quit.   [name] 11/05/2011 4:46 PM

## 2011-11-06 ENCOUNTER — Encounter (HOSPITAL_COMMUNITY): Payer: Self-pay | Admitting: General Practice

## 2011-11-06 ENCOUNTER — Other Ambulatory Visit: Payer: Self-pay

## 2011-11-06 ENCOUNTER — Encounter (HOSPITAL_COMMUNITY)
Admission: EM | Disposition: A | Discharge status: Home / Self Care | Payer: Self-pay | Source: Home / Self Care | Attending: Cardiology

## 2011-11-06 DIAGNOSIS — R0989 Other specified symptoms and signs involving the circulatory and respiratory systems: Secondary | ICD-10-CM

## 2011-11-06 DIAGNOSIS — I517 Cardiomegaly: Secondary | ICD-10-CM

## 2011-11-06 DIAGNOSIS — I2510 Atherosclerotic heart disease of native coronary artery without angina pectoris: Secondary | ICD-10-CM

## 2011-11-06 HISTORY — PX: CORONARY ANGIOPLASTY WITH STENT PLACEMENT: SHX49

## 2011-11-06 HISTORY — PX: PERCUTANEOUS CORONARY STENT INTERVENTION (PCI-S): SHX5485

## 2011-11-06 HISTORY — PX: LEFT HEART CATHETERIZATION WITH CORONARY ANGIOGRAM: SHX5451

## 2011-11-06 LAB — LIPID PANEL
Cholesterol: 103 mg/dL (ref 0–200)
LDL Cholesterol: 39 mg/dL (ref 0–99)
Total CHOL/HDL Ratio: 1.9 RATIO
VLDL: 11 mg/dL (ref 0–40)

## 2011-11-06 LAB — CBC
MCV: 85.3 fL (ref 78.0–100.0)
Platelets: 179 10*3/uL (ref 150–400)
RBC: 4.75 MIL/uL (ref 4.22–5.81)
RDW: 14.5 % (ref 11.5–15.5)
WBC: 9 10*3/uL (ref 4.0–10.5)

## 2011-11-06 LAB — BASIC METABOLIC PANEL
BUN: 12 mg/dL (ref 6–23)
Calcium: 8.7 mg/dL (ref 8.4–10.5)
Creatinine, Ser: 0.85 mg/dL (ref 0.50–1.35)
GFR calc Af Amer: >90 mL/min (ref >90)
GFR calc non Af Amer: 89 mL/min — ABNORMAL LOW (ref >90)

## 2011-11-06 LAB — GLUCOSE, CAPILLARY
Glucose-Capillary: 241 mg/dL — ABNORMAL HIGH (ref 70–99)
Glucose-Capillary: 144 mg/dL — ABNORMAL HIGH (ref 70–99)
Glucose-Capillary: 262 mg/dL — ABNORMAL HIGH (ref 70–99)

## 2011-11-06 LAB — POCT ACTIVATED CLOTTING TIME: Activated Clotting Time: 634 seconds

## 2011-11-06 LAB — HEMOGLOBIN A1C
Hgb A1c MFr Bld: 7.9 % — ABNORMAL HIGH (ref <5.7)
Mean Plasma Glucose: 180 mg/dL — ABNORMAL HIGH (ref <117)

## 2011-11-06 LAB — HEPARIN LEVEL (UNFRACTIONATED): Heparin Unfractionated: 0.11 IU/mL — ABNORMAL LOW (ref 0.30–0.70)

## 2011-11-06 SURGERY — LEFT HEART CATHETERIZATION WITH CORONARY ANGIOGRAM
Anesthesia: LOCAL

## 2011-11-06 MED ORDER — NITROGLYCERIN 0.2 MG/ML ON CALL CATH LAB
INTRAVENOUS | Status: AC
  Filled 2011-11-06: qty 1

## 2011-11-06 MED ORDER — LIDOCAINE HCL (PF) 1 % IJ SOLN
INTRAMUSCULAR | Status: AC
  Filled 2011-11-06: qty 30

## 2011-11-06 MED ORDER — FENTANYL CITRATE 0.05 MG/ML IJ SOLN
INTRAMUSCULAR | Status: AC
  Filled 2011-11-06: qty 2

## 2011-11-06 MED ORDER — MIDAZOLAM HCL 2 MG/2ML IJ SOLN
INTRAMUSCULAR | Status: AC
  Filled 2011-11-06: qty 2

## 2011-11-06 MED ORDER — SODIUM CHLORIDE 0.9 % IV SOLN
INTRAVENOUS | Status: AC
  Administered 2011-11-06: 22:00:00 via INTRAVENOUS

## 2011-11-06 MED ORDER — BIVALIRUDIN 250 MG IV SOLR
INTRAVENOUS | Status: AC
  Filled 2011-11-06: qty 250

## 2011-11-06 MED ORDER — ATROPINE SULFATE 1 MG/ML IJ SOLN
INTRAMUSCULAR | Status: AC
  Filled 2011-11-06: qty 1

## 2011-11-06 MED ORDER — HEPARIN (PORCINE) IN NACL 2-0.9 UNIT/ML-% IJ SOLN
INTRAMUSCULAR | Status: AC
  Filled 2011-11-06: qty 2000

## 2011-11-06 MED ORDER — PRASUGREL HCL 10 MG PO TABS
10.0000 mg | ORAL_TABLET | Freq: Every day | ORAL | Status: DC
  Administered 2011-11-07: 10 mg via ORAL
  Filled 2011-11-06 (×2): qty 1

## 2011-11-06 MED ORDER — HEPARIN BOLUS VIA INFUSION
2500.0000 [IU] | Freq: Once | INTRAVENOUS | Status: AC
  Administered 2011-11-06: 2500 [IU] via INTRAVENOUS
  Filled 2011-11-06: qty 2500

## 2011-11-06 MED ORDER — PRASUGREL HCL 10 MG PO TABS
ORAL_TABLET | ORAL | Status: AC
  Administered 2011-11-07: 10 mg via ORAL
  Filled 2011-11-06: qty 6

## 2011-11-06 NOTE — Op Note (Signed)
Cardiac Catheterization Procedure Note  Name: Aaron Santiago MRN: [id number] DOB: May 01, 1946  Procedure: Left Heart Cath, Selective Coronary Angiography, LIMA angiography, LV angiography,  PTCA/Stent of proximal right coronary artery with a drug-eluting stent, PTCA/stent of mid left circumflex artery with a drug-eluting stent.  Indication: This is a 65 year old male with known history of coronary artery disease status post one-vessel CABG in 2003 with LIMA to LAD who presented with unstable angina.  Diagnostic Procedure Details: The right groin was prepped, draped, and anesthetized with 1% lidocaine. Using the modified Seldinger technique, a 5 French sheath was introduced into the right femoral artery. Standard Judkins catheters were used for selective coronary angiography and left ventriculography. Catheter exchanges were performed over a wire.  The diagnostic procedure was well-tolerated without immediate complications.  PROCEDURAL FINDINGS Hemodynamics: AO 117/55/80 mmHg LV 118/2. EDP: 11 mm mercury  Coronary angiography: Coronary dominance: right  Left mainstem: The vessel is normal in size and free of any significant disease.  Left anterior descending (LAD): The vessel is normal in size with mild atherosclerosis proximally. It is occluded  after  giving a large first diagonal and septal branch. The first diagonal is overall large in size with 80% ostial stenosis. The first septal is also large in size with 80% ostial stenosis. This is a bifurcation lesion.  Left circumflex (LCx): The vessel is normal in size and nondominant. There is a diffuse 70% disease in the midsegment ending with a 90% stenosis more distally. OM1 is overall small in size. OM 2 is normal in size with mild 20% ostial disease. OM 3 is normal in size.  Right coronary artery (RCA): The vessel is normal in size and dominant. There is an 80% tubular stenosis proximally. Distally there is one first lesion estimated to be  30% followed by another 40-50% lesion distally before the bifurcation. The PDA is medium in size and free of significant disease. The posterolateral branches are also free of significant disease.  LIMA to LAD: Is patent. In the LAD just distal to the anastomosis there is a 60% tubular stenosis.  Left ventriculography: Left ventricular systolic function is normal, LVEF is estimated at 55-65%, there is no significant mitral regurgitation   PCI Procedure Note:  Following the diagnostic procedure, the decision was made to proceed with PCI. The sheath was upsized to a 6 [place]. Weight-based bivalirudin was given for anticoagulation. Once a therapeutic ACT was achieved, a 6 [place] JR 4 guide catheter was inserted.  A Prowater coronary guidewire was used to cross the lesion.  The lesion was predilated with a 2.5 x 12 balloon.  The lesion was then stented with a 3.0 x 15 mm resolute integrity stent.    Following PCI, there was 0% residual stenosis and TIMI-3 flow. Final angiography confirmed an excellent result.  I then used an XB 3.5 guiding catheter. The lesion in the left circumflex was crossed with a Prowater wire. It was dilated with the same 2.5 x 12 mm balloon. Following this, the patient had severe bradycardia and hypotension. He was treated with 0.5 mg atropine and responded well. The lesion was then stented with a 2.5 x 28 mm Promus elements stent. It was post dilated with a 2.75 x 20 mm noncompliant balloon to 10 atmospheres distally and 12 atmospheric proximally. OM 2 was a jailed with a stent which did not affect the ostial disease or the flow. Final angiography showed excellent results with TIMI-3 flow. Femoral hemostasis was achieved with a Perclose device.  The patient tolerated the PCI procedure well. There were no immediate procedural complications.  The patient was transferred to the post catheterization recovery area for further monitoring.  PCI Data: Vessel - RCA/Segment - proximal Percent  Stenosis (pre)  80% TIMI-flow 3 Stent 3.0 x 15 mm resolute integrity stent Percent Stenosis (post) 0% TIMI-flow (post) 3   Vessel - left circumflex/Segment - mid Percent Stenosis (pre)  90% TIMI-flow 3 Stent 2.5 x 28 mm Promus drug-eluting stent Percent Stenosis (post) 0% TIMI-flow (post) 3  Final Conclusions:   1. Significant three-vessel coronary artery disease with patent LIMA to LAD. 2. Significant new obstructive disease in proximal RCA and mid left circumflex. There is borderline lesion in the left anterior descending artery distal to the LIMA anastomosis. There is also significant first diagonal/septal disease which is a bifurcation lesion. 3. Normal LV systolic function. 4. Successful angioplasty and drug-eluting stent placement of both right coronary artery as well as mid left circumflex.  Recommendations: I recommend dual antiplatelet therapy for at least one year. Diagonal angioplasty can be considered for refractory symptoms but that's not an ideal location given that it's a bifurcation lesion with a large septal perforator branch. The disease in the LAD distal to the anastomosis is borderline significant but probably should be treated medically.  Lorine Bears 11/06/2011, 12:31 PM

## 2011-11-06 NOTE — Progress Notes (Signed)
*  PRELIMINARY RESULTS* Echocardiogram 2D Echocardiogram has been performed.  Clide Deutscher RDCS 11/06/2011, 9:07 AM

## 2011-11-06 NOTE — Progress Notes (Signed)
Bilateral carotid artery duplex completed at 09:35.  Preliminary report is 60-79% ICA stenosis on the right and no evidence of significant stenosis on the left.  Vertebral artery flow is antegrade bilaterally. Smiley Houseman 11/06/2011, 10:17 AM

## 2011-11-06 NOTE — Progress Notes (Signed)
Patient ID: Aaron Santiago, male   DOB: 1946-07-12, 65 y.o.   MRN: [id number] [facility] Cardiology  SUBJECTIVE:No further chest pain on NTG gtt.  BP better controlled.    Filed Vitals:   11/05/11 2058 11/05/11 2100 11/06/11 0139 11/06/11 0635  BP: 121/69 121/69 115/65 130/74  Pulse: 77 77 83 65  Temp:  98.4 F (36.9 C) 99.1 F (37.3 C) 98 F (36.7 C)  TempSrc:      Resp:  18 16 18   Height:      Weight:    75.569 kg (166 lb 9.6 oz)  SpO2:  92% 93% 95%    Intake/Output Summary (Last 24 hours) at 11/06/11 0809 Last data filed at 11/06/11 9629  Gross per 24 hour  Intake 666.38 ml  Output    325 ml  Net 341.38 ml    LABS: Basic Metabolic Panel:  Basename 11/05/11 0811  NA 135  K 3.8  CL 98  CO2 28  GLUCOSE 168*  BUN 11  CREATININE 0.88  CALCIUM 9.2  MG --  PHOS --   Liver Function Tests:  Basename 11/05/11 0811  AST 25  ALT 29  ALKPHOS 74  BILITOT 0.5  PROT 6.7  ALBUMIN 3.6   No results found for this basename: LIPASE:2,AMYLASE:2 in the last 72 hours CBC:  Basename 11/06/11 0510 11/05/11 0811  WBC 9.0 6.6  NEUTROABS -- 4.3  HGB 13.5 16.0  HCT 40.5 45.9  MCV 85.3 84.5  PLT 179 178   Cardiac Enzymes:  Basename 11/05/11 1540 11/05/11 0810  CKTOTAL 90 138  CKMB 3.8 4.9*  CKMBINDEX -- --  TROPONINI <0.30 <0.30   BNP: No results found for this basename: POCBNP:3 in the last 72 hours D-Dimer: No results found for this basename: DDIMER:2 in the last 72 hours Hemoglobin A1C:  Basename 11/05/11 1743  HGBA1C 7.9*   Fasting Lipid Panel:  Basename 11/06/11 0520  CHOL 103  HDL 53  LDLCALC 39  TRIG 55  CHOLHDL 1.9  LDLDIRECT --   Thyroid Function Tests:  Basename 11/05/11 1545  TSH 1.156  T4TOTAL --  T3FREE --  THYROIDAB --   Anemia Panel: No results found for this basename: VITAMINB12,FOLATE,FERRITIN,TIBC,IRON,RETICCTPCT in the last 72 hours  RADIOLOGY: Dg Chest Port 1 View  11/05/2011  *RADIOLOGY REPORT*  Clinical Data: Chest  tightness, high blood pressure, history of CABG  PORTABLE CHEST - 1 VIEW  Comparison: Portable chest x-ray of 07/20/2010  Findings: No active infiltrate or effusion is seen.  There is slight elevation of the left hemidiaphragm.  The heart is mildly enlarged and stable.  Median sternotomy sutures are noted.  IMPRESSION: No active lung disease.  Slight elevation of the left hemidiaphragm.  Original Report Authenticated By: Juline Patch, M.D.    PHYSICAL EXAM General: NAD Neck: No JVD, no thyromegaly or thyroid nodule.  Lungs: Clear to auscultation bilaterally with normal respiratory effort. CV: Nondisplaced PMI.  Heart regular S1/S2, no S3/S4, no murmur.  No peripheral edema. Right carotid bruit.  Normal pedal pulses.  Abdomen: Soft, nontender, no hepatosplenomegaly, no distention.  Neurologic: Alert and oriented x 3.  Psych: Normal affect. Extremities: No clubbing or cyanosis.   ASSESSMENT AND PLAN:  Patient has not seen a cardiologist since CABG in 2002. He has been on ASA 81, Lipitor 10, and insulin at home. He continues to smoke. He has had episodes of central chest tightness on and off for a month. He woke up early yesterday am with chest  tightness and has had it on and off today. It is not exertional. He checked his BP and found it to be over 200/100 so came to the ER. He does not take BP medication. In the ER, ECG was unchanged. Cardiac enzymes negative. BP is now controlled on NTG gtt.  1. Chest pain: Atypical but has been present on and off every day for a month and is getting worse. He says it feels like pain before CABG. ECG is unchanged and cardiac enzymes so far negative. Chest pain was present when he arrived in ER but is now resolved with NTG gtt and BP control.  - ASA, heparin gtt, NTG gtt, metoprolol, and Crestor 40.  - He is 10 years out from CABG and while symptoms are atypical, they are similar to prior to CABG and are present very frequently. I think best course will be LHC,  will plan for today. 2. HTN: Hypertensive urgency with chest pain. Continue NTG gtt for BP control for now. Added metoprolol 25 mg q6 hrs and lisinopril 10 mg. Will need to be on antihypertensives at home. Will titrate off NTG gtt after cath.  3. Smoking: Needs to quit.   Aaron Santiago 11/06/2011 8:11 AM

## 2011-11-06 NOTE — Progress Notes (Signed)
Inpatient Diabetes Program Recommendations  AACE/ADA: New Consensus Statement on Inpatient Glycemic Control (2009)  Target Ranges:  Prepandial:   less than 140 mg/dL      Peak postprandial:   less than 180 mg/dL (1-2 hours)      Critically ill patients:  140 - 180 mg/dL   Reason for Visit: CBG's 304 and 241 mg/dL.  Inpatient Diabetes Program Recommendations Insulin - Basal: Note according to [facility], patient did not receive Levemir last PM b/c NPO. HgbA1C: A1c=7.9%  Note: Consider starting correction Novolog ASAP due to elevated CBG's.  Currently not scheduled to start until 1700.  Patient also needs home dose of Levemir.

## 2011-11-06 NOTE — Progress Notes (Signed)
ANTICOAGULATION CONSULT NOTE - Follow Up Consult  Pharmacy Consult for heparin Indication: chest pain/ACS  No Known Allergies  Patient Measurements: Height: 6' (182.9 cm) Weight: 166 lb 9.6 oz (75.569 kg) (scale c) IBW/kg (Calculated) : 77.6   Vital Signs: Temp: 98 F (36.7 C) (12/07 0635) BP: 130/74 mmHg (12/07 0635) Pulse Rate: 65  (12/07 0635)  Labs:  Basename 11/06/11 0510 11/05/11 2155 11/05/11 1545 11/05/11 1540 11/05/11 0811 11/05/11 0810  HGB 13.5 -- -- -- 16.0 --  HCT 40.5 -- -- -- 45.9 --  PLT 179 -- -- -- 178 --  APTT -- -- >200* -- -- --  LABPROT -- -- 14.2 -- -- --  INR -- -- 1.08 -- -- --  HEPARINUNFRC 0.11* 0.36 -- -- -- --  CREATININE -- -- -- -- 0.88 --  CKTOTAL -- -- -- 90 -- 138  CKMB -- -- -- 3.8 -- 4.9*  TROPONINI -- -- -- <0.30 -- <0.30   Estimated Creatinine Clearance: 89.5 ml/min (by C-G formula based on Cr of 0.88).  Medications:  Scheduled:    . aspirin  324 mg Oral Once  . aspirin  324 mg Oral Pre-Cath  . aspirin EC  81 mg Oral Daily  . diazepam  5 mg Oral On Call  . fish oil-omega-3 fatty acids  2 g Oral Daily  . folic acid  0.5 mg Oral Daily  . heparin  2,500 Units Intravenous Once  . heparin  4,000 Units Intravenous Once  . insulin aspart  0-15 Units Subcutaneous TID WC  . insulin aspart  0-5 Units Subcutaneous QHS  . insulin detemir  30 Units Subcutaneous QHS  . labetalol  20 mg Intravenous Once  . lisinopril  10 mg Oral Daily  . metoprolol tartrate  25 mg Oral Q6H  . nitroGLYCERIN  5 mcg/min Intravenous Once  . pyridOXINE  500 mg Oral Daily  . rosuvastatin  40 mg Oral q1800  . sodium chloride  3 mL Intravenous Q12H  . thiamine  100 mg Oral Daily  . vitamin C  1,000 mg Oral Daily  . vitamin E  1,000 Units Oral Daily  . DISCONTD: folic acid  400 mcg Oral Daily  . DISCONTD: vitamin B-6  500 mg Oral Daily   Infusions:    . sodium chloride 100 mL/hr at 11/06/11 0524  . heparin 900 Units/hr (11/05/11 1529)  . nitroGLYCERIN        Assessment: 65yo male now subtherapeutic on heparin after one therapeutic level, cath scheduled this am.  Goal of Therapy:  Heparin level 0.3-0.7 units/ml   Plan:  Will give heparin bolus of 2500 units and increase rate by 4 units/kg/hr to 1200 units/hr and check level in 6hr.  Colleen Can PharmD BCPS 11/06/2011,7:13 AM

## 2011-11-06 NOTE — Interval H&P Note (Signed)
History and Physical Interval Note:  11/06/2011 11:02 AM  Aaron Santiago  has presented today for surgery, with the diagnosis of Chest pain  The various methods of treatment have been discussed with the patient and family. After consideration of risks, benefits and other options for treatment, the patient has consented to  Procedure(s): LEFT HEART CATHETERIZATION WITH CORONARY ANGIOGRAM as a surgical intervention .  The patients' history has been reviewed, patient examined, no change in status, stable for surgery.  I have reviewed the patients' chart and labs.  Questions were answered to the patient's satisfaction.     Lorine Bears

## 2011-11-07 ENCOUNTER — Other Ambulatory Visit: Payer: Self-pay

## 2011-11-07 DIAGNOSIS — I2510 Atherosclerotic heart disease of native coronary artery without angina pectoris: Principal | ICD-10-CM

## 2011-11-07 LAB — CBC
HCT: 45.7 % (ref 39.0–52.0)
Hemoglobin: 16 g/dL (ref 13.0–17.0)
MCH: 29.6 pg (ref 26.0–34.0)
RBC: 5.41 MIL/uL (ref 4.22–5.81)

## 2011-11-07 LAB — BASIC METABOLIC PANEL
CO2: 25 mEq/L (ref 19–32)
Glucose, Bld: 188 mg/dL — ABNORMAL HIGH (ref 70–99)
Potassium: 3.9 mEq/L (ref 3.5–5.1)
Sodium: 135 mEq/L (ref 135–145)

## 2011-11-07 MED ORDER — METOPROLOL TARTRATE 50 MG PO TABS: 50.0000 mg | ORAL_TABLET | Freq: Two times a day (BID) | ORAL | Status: DC

## 2011-11-07 MED ORDER — PRASUGREL HCL 10 MG PO TABS: 10.0000 mg | ORAL_TABLET | Freq: Every day | ORAL | Status: DC

## 2011-11-07 MED ORDER — NITROGLYCERIN 0.4 MG SL SUBL: 0.4000 mg | SUBLINGUAL_TABLET | SUBLINGUAL | Status: DC | PRN

## 2011-11-07 MED ORDER — LISINOPRIL 20 MG PO TABS: 20.0000 mg | ORAL_TABLET | Freq: Every day | ORAL | Status: DC

## 2011-11-07 MED ORDER — AZITHROMYCIN 250 MG PO TABS: ORAL_TABLET | ORAL | Status: AC

## 2011-11-07 MED ORDER — ROSUVASTATIN CALCIUM 40 MG PO TABS: 40.0000 mg | ORAL_TABLET | Freq: Every day | ORAL | Status: DC

## 2011-11-07 NOTE — Discharge Summary (Signed)
Physician Discharge Summary  Patient ID: DRAYDEN LUKAS MRN: [id number] DOB/AGE: Dec 04, 1945 65 y.o.  Admit date: 11/05/2011 Discharge date: 11/07/2011  Primary Cardiologist: Mady Gemma, MD (new)  Primary Discharge Diagnosis: CAD  DES pRCA and midCFX  Patent LIMA-LAD  NL LVF NSVT Carotid bruits URI  Secondary Discharge Diagnoses: CAD  S/p CABG (1), 2002 HTN HLD IDDM Tobbaccp PUD   Reason for Admission: 69 yom with known CAD, presents with symptoms worrisome for UAP.  Hospital Course: Patient ruled out for MI, and was referred for coronary angiography (see cardiac catheterization report for full details). He underwent successful PCI, as outlined above, with no noted complications. Patient was cleared for discharge the following morning, in hemodynamically stable condition. There were no noted complications of the R groin catheterization incision site. Dual antiplatelet therapy recommended for at least one year. Transient run of asymptomatic NSVT was noted (less than 10 beats).  Patient started on Z-Pak at time of discharge, per Dr. Jens Som, for treatment of possible URI.  Patient will need carotid Dopplers, to be scheduled at time of office followup.  Discharge Vitals: Blood pressure 166/95, pulse 72, temperature 98.8 F (37.1 C), temperature source Oral, resp. rate 18, height 6' (1.829 m), weight 164 lb (74.39 kg), SpO2 96.00%.  Labs:   Lab Results  Component Value Date   WBC 8.5 11/07/2011   HGB 16.0 11/07/2011   HCT 45.7 11/07/2011   MCV 84.5 11/07/2011   PLT 177 11/07/2011    Lab 11/07/11 0625 11/05/11 0811  NA 135 --  K 3.9 --  CL 100 --  CO2 25 --  BUN 11 --  CREATININE 0.94 --  CALCIUM 9.0 --  PROT -- 6.7  BILITOT -- 0.5  ALKPHOS -- 74  ALT -- 29  AST -- 25  GLUCOSE 188* --   Lab Results  Component Value Date   CKTOTAL 90 11/05/2011   CKMB 3.8 11/05/2011   TROPONINI <0.30 11/05/2011    Lab Results  Component Value Date   CHOL 103 11/06/2011    Lab Results  Component Value Date   HDL 53 11/06/2011   Lab Results  Component Value Date   LDLCALC 39 11/06/2011   Lab Results  Component Value Date   TRIG 55 11/06/2011   Lab Results  Component Value Date   CHOLHDL 1.9 11/06/2011   No results found for this basename: LDLDIRECT     DISPOSITION: Stable condition  FOLLOW UP PLANS AND APPOINTMENTS: Discharge Orders    Future Orders Please Complete By Expires   Diet - low sodium heart healthy      Increase activity slowly          DISCHARGE MEDICATIONS: Current Discharge Medication List    START taking these medications   Details  azithromycin (ZITHROMAX Z-PAK) 250 MG tablet Take as directed, with 2 tabs initially, over course of 5 days Qty: 6 each, Refills: 0    lisinopril (PRINIVIL,ZESTRIL) 20 MG tablet Take 1 tablet (20 mg total) by mouth daily. Qty: 30 tablet, Refills: 6    metoprolol tartrate (LOPRESSOR) 50 MG tablet Take 1 tablet (50 mg total) by mouth 2 (two) times daily. Qty: 60 tablet, Refills: 6    nitroGLYCERIN (NITROSTAT) 0.4 MG SL tablet Place 1 tablet (0.4 mg total) under the tongue every 5 (five) minutes as needed for chest pain. Qty: 25 tablet, Refills: 1    prasugrel (EFFIENT) 10 MG TABS Take 1 tablet (10 mg total) by mouth daily. Qty: 30 tablet,  Refills: 6    rosuvastatin (CRESTOR) 40 MG tablet Take 1 tablet (40 mg total) by mouth daily at 6 PM. Qty: 30 tablet, Refills: 6      CONTINUE these medications which have NOT CHANGED   Details  Ascorbic Acid (VITAMIN C) 1000 MG tablet Take 1,000 mg by mouth daily.      aspirin EC 81 MG tablet Take 81 mg by mouth daily.      Cod Liver Oil CAPS Take by mouth.      fish oil-omega-3 fatty acids 1000 MG capsule Take 2 g by mouth daily.      folic acid (FOLVITE) 400 MCG tablet Take 400 mcg by mouth daily.      hydroxypropyl methylcellulose (ISOPTO TEARS) 2.5 % ophthalmic solution Place 1 drop into both eyes 4 (four) times daily as needed.       insulin detemir (LEVEMIR) 100 UNIT/ML injection Inject 30 Units into the skin 1 day or 1 dose. Before breakfast     Pyridoxine HCl (VITAMIN B-6) 500 MG tablet Take 500 mg by mouth daily.        STOP taking these medications     atorvastatin (LIPITOR) 10 MG tablet      thiamine (VITAMIN B-1) 100 MG tablet      vitamin E 1000 UNIT capsule         BRING ALL MEDICATIONS WITH YOU TO FOLLOW UP APPOINTMENTS  Time spent with patient to include physician time: Greater than 30 minutes, including physician time.  Signed: Gene Dillyn Joaquin 11/07/2011, 9:04 AM Co-Sign MD

## 2011-11-07 NOTE — Progress Notes (Signed)
CARDIAC REHAB PHASE I   PRE:  Rate/Rhythm: 80 SR    BP: sitting 168/95 Dinamapp, Manual L 180/96, R 166/100    SaO2:   MODE:  Ambulation: 340 ft   POST:  Rate/Rhythm: 90 SR    BP: sitting L 172/90, R 162/90     SaO2:   Pt BP high. Actually lower after walk. No c/o with walking. Ed completed with high emphasis on smoking and DM control. Requests referral be sent to [facility] but doubt pt will actually participate. [id number]  Harriet Masson CES, ACSM

## 2011-11-07 NOTE — Progress Notes (Signed)
@   Subjective:  Denies CP or dyspnea; complains of cough and congestion   Objective:  Filed Vitals:   11/07/11 0023 11/07/11 0312 11/07/11 0608 11/07/11 0838  BP: 171/87 160/89 158/92 166/95  Pulse: 78  83 72  Temp: 100 F (37.8 C)  99.6 F (37.6 C) 98.8 F (37.1 C)  TempSrc: Oral  Oral Oral  Resp: 16 20 16 18   Height:      Weight:   164 lb (74.39 kg)   SpO2: 95%  95% 96%    Intake/Output from previous day:  Intake/Output Summary (Last 24 hours) at 11/07/11 0847 Last data filed at 11/07/11 0600  Gross per 24 hour  Intake    660 ml  Output   2600 ml  Net  -1940 ml    Physical Exam: Physical exam: Well-developed well-nourished in no acute distress.  Skin is warm and dry.  HEENT is normal.  Neck is supple. No thyromegaly.  Chest is clear to auscultation with normal expansion.  Cardiovascular exam is regular rate and rhythm.  Abdominal exam nontender or distended. No masses palpated. Right Groin with no hematoma and no bruit Extremities show no edema. neuro grossly intact    Lab Results: Basic Metabolic Panel:  Basename 11/07/11 0625 11/06/11 1015  NA 135 133*  K 3.9 4.0  CL 100 100  CO2 25 24  GLUCOSE 188* 210*  BUN 11 12  CREATININE 0.94 0.85  CALCIUM 9.0 8.7  MG -- --  PHOS -- --   Liver Function Tests:  [facility] 11/05/11 0811  AST 25  ALT 29  ALKPHOS 74  BILITOT 0.5  PROT 6.7  ALBUMIN 3.6   CBC:  Basename 11/07/11 0625 11/06/11 0510 11/05/11 0811  WBC 8.5 9.0 --  NEUTROABS -- -- 4.3  HGB 16.0 13.5 --  HCT 45.7 40.5 --  MCV 84.5 85.3 --  PLT 177 179 --   Cardiac Enzymes:  Basename 11/05/11 1540 11/05/11 0810  CKTOTAL 90 138  CKMB 3.8 4.9*  CKMBINDEX -- --  TROPONINI <0.30 <0.30     Basename 11/05/11 1743  HGBA1C 7.9*   Fasting Lipid Panel:  Basename 11/06/11 0520  CHOL 103  HDL 53  LDLCALC 39  TRIG 55  CHOLHDL 1.9  LDLDIRECT --   Thyroid Function Tests:  Basename 11/05/11 1545  TSH 1.156  T4TOTAL --  T3FREE --    THYROIDAB --      Assessment/Plan:  1) CAD; s/p PCI-continue asa and prasugrel; continue statin; dc today and fu [place] 2) Hypertension - increase lisinopril to 20 mg daily and titrate as outpatient. 3) URI - add zpack 4) carotid bruits - dopplers as outpatient >30 min Pa and physician time D2  Olga Millers 11/07/2011, 8:47 AM

## 2011-11-07 NOTE — Discharge Summary (Signed)
See progress note Aaron Santiago  

## 2011-11-09 ENCOUNTER — Encounter: Payer: Self-pay | Admitting: Cardiovascular Disease

## 2011-11-10 MED FILL — Dextrose Inj 5%: INTRAVENOUS | Qty: 50 | Status: AC

## 2011-12-08 ENCOUNTER — Ambulatory Visit (INDEPENDENT_AMBULATORY_CARE_PROVIDER_SITE_OTHER): Payer: Medicare Other | Admitting: Cardiovascular Disease

## 2011-12-08 ENCOUNTER — Encounter: Payer: Self-pay | Admitting: Cardiovascular Disease

## 2011-12-08 VITALS — BP 149/78 | HR 63 | Ht 72.0 in | Wt 174.5 lb

## 2011-12-08 DIAGNOSIS — I10 Essential (primary) hypertension: Secondary | ICD-10-CM

## 2011-12-08 DIAGNOSIS — I2510 Atherosclerotic heart disease of native coronary artery without angina pectoris: Secondary | ICD-10-CM

## 2011-12-08 DIAGNOSIS — F17200 Nicotine dependence, unspecified, uncomplicated: Secondary | ICD-10-CM

## 2011-12-08 DIAGNOSIS — R0789 Other chest pain: Secondary | ICD-10-CM

## 2011-12-08 DIAGNOSIS — E7800 Pure hypercholesterolemia, unspecified: Secondary | ICD-10-CM

## 2011-12-08 DIAGNOSIS — I6529 Occlusion and stenosis of unspecified carotid artery: Secondary | ICD-10-CM

## 2011-12-08 DIAGNOSIS — Z720 Tobacco use: Secondary | ICD-10-CM

## 2011-12-08 MED ORDER — LISINOPRIL 40 MG PO TABS: 40.0000 mg | ORAL_TABLET | Freq: Every day | ORAL | Status: DC

## 2011-12-08 NOTE — Assessment & Plan Note (Signed)
The patient will need a followup carotid Doppler in 6 months. This will be done in may of this year.

## 2011-12-08 NOTE — Assessment & Plan Note (Signed)
He is on high-dose Crestor 40 mg once daily. He will need a followup fasting lipid profile during his followup in 2 months.

## 2011-12-08 NOTE — Patient Instructions (Signed)
Follow up as scheduled. Increase Lisinopril to 40 mg daily. You may take 2 of your 20 mg tablets until completed and then get new prescription filled for 40 mg tablets. A new prescription was sent to your pharmacy to reflect this change.  Your physician discussed the hazards of tobacco use. Tobacco use cessation is recommended and techniques and options to help you quit were discussed.
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
## 2011-12-08 NOTE — Assessment & Plan Note (Signed)
His blood pressure has improved but still not at target. I recommend increasing the dose of lisinopril to 40 mg once daily.

## 2011-12-08 NOTE — Assessment & Plan Note (Signed)
I discussed with him the importance of smoking cessation. 

## 2011-12-08 NOTE — Assessment & Plan Note (Signed)
The patient seems to be doing reasonably well at this time. He had a recent angioplasty and 2 drug-eluting stent placements to the RCA and left circumflex. I recommend continuing dual antiplatelet therapy for at least one year until December of 2013. I also encouraged him to attend cardiac rehabilitation. I discussed with him the importance of healthy diet and exercise. His blood pressure will need to be more controlled.

## 2012-01-11 ENCOUNTER — Telehealth: Payer: Self-pay | Admitting: *Deleted

## 2012-01-11 NOTE — Telephone Encounter (Signed)
Patient also feeling tightness in chest area also after rehab today but it has gone away now. Patient hasn't taken any nitroglycerin saying he didn't feel like it was necessary. Patient state's he has also been hearing his heart beat in his head for several days now. Nurse advised patient to continue monitoring his BP daily, take his nitroglycerin for any chest discomfort and if no relief after 3rd one, he should go to ED for evaluation. Please advise for further instructions.

## 2012-01-12 NOTE — Telephone Encounter (Signed)
He needs to come for an office visit this week. Add him to my schedule.

## 2012-01-12 NOTE — Telephone Encounter (Signed)
Patient informed and appointment given for Thursday @2 :30pm.

## 2012-01-14 ENCOUNTER — Encounter: Payer: Self-pay | Admitting: *Deleted

## 2012-01-14 ENCOUNTER — Ambulatory Visit (INDEPENDENT_AMBULATORY_CARE_PROVIDER_SITE_OTHER): Payer: Medicare Other | Admitting: Cardiovascular Disease

## 2012-01-14 ENCOUNTER — Encounter: Payer: Self-pay | Admitting: Cardiovascular Disease

## 2012-01-14 DIAGNOSIS — F17200 Nicotine dependence, unspecified, uncomplicated: Secondary | ICD-10-CM

## 2012-01-14 DIAGNOSIS — I2510 Atherosclerotic heart disease of native coronary artery without angina pectoris: Secondary | ICD-10-CM

## 2012-01-14 DIAGNOSIS — R079 Chest pain, unspecified: Secondary | ICD-10-CM

## 2012-01-14 DIAGNOSIS — I10 Essential (primary) hypertension: Secondary | ICD-10-CM

## 2012-01-14 DIAGNOSIS — E7800 Pure hypercholesterolemia, unspecified: Secondary | ICD-10-CM

## 2012-01-14 DIAGNOSIS — I6529 Occlusion and stenosis of unspecified carotid artery: Secondary | ICD-10-CM

## 2012-01-14 DIAGNOSIS — Z720 Tobacco use: Secondary | ICD-10-CM

## 2012-01-14 MED ORDER — AMLODIPINE BESYLATE 5 MG PO TABS: 5.0000 mg | ORAL_TABLET | Freq: Every day | ORAL | Status: DC

## 2012-01-14 NOTE — Patient Instructions (Signed)
Follow up as scheduled. Start Amlodipine 5 mg daily. Your physician has requested that you have en exercise stress myoview. For further information please visit [email or web address] Please follow instruction sheet, as given.  Your physician has requested that you have a carotid duplex. This test is an ultrasound of the carotid arteries in your neck. It looks at blood flow through these arteries that supply the brain with blood. Allow one hour for this exam. There are no restrictions or special instructions. DO IN MAY.

## 2012-01-14 NOTE — Assessment & Plan Note (Signed)
His blood pressure is still not well-controlled. This might be the reason for his chest pain. I will go ahead and add amlodipine 5 mg once daily. If his blood pressure remains uncontrolled, I will consider switching him from metoprolol to carvedilol.

## 2012-01-14 NOTE — Assessment & Plan Note (Signed)
Lab Results  Component Value Date   CHOL 103 11/06/2011   HDL 53 11/06/2011   LDLCALC 39 11/06/2011   TRIG 55 11/06/2011   CHOLHDL 1.9 11/06/2011   His last lipid profile was optimal. He informed me that he had slightly elevated liver enzymes recently and the dose of Crestor was decreased to 20 mg daily. I agree with this change.

## 2012-01-14 NOTE — Assessment & Plan Note (Signed)
I advised him to quit smoking.  

## 2012-01-14 NOTE — Progress Notes (Signed)
HPI  This is a 66 year old man who is here today for an urgent evaluation after recent chest pain. He has no history of coronary artery disease. He goes to cardiac rehabilitation after his recent stenting. Recently, he had an episode where his blood pressure was running high and he had prolonged feeling of chest tightness. He did not take nitroglycerin and did not seek medical attention. He said that it was not severe enough to require treatment. He did have a few occasions where his blood pressure was running greater than 180 systolic. Otherwise he has been able to continue going to cardiac rehabilitation and has not had recurrence chest pain. Unfortunately he continues to smoke but has been taking his medications regularly.  No Known Allergies   Current Outpatient Prescriptions on File Prior to Visit  Medication Sig Dispense Refill  . Ascorbic Acid (VITAMIN C) 1000 MG tablet Take 1,000 mg by mouth daily.        Marland Kitchen aspirin EC 81 MG tablet Take 81 mg by mouth daily.        Marland Kitchen Cod Liver Oil CAPS Take by mouth.        . fish oil-omega-3 fatty acids 1000 MG capsule Take 2 g by mouth daily.        . folic acid (FOLVITE) 400 MCG tablet Take 400 mcg by mouth daily.        . hydroxypropyl methylcellulose (ISOPTO TEARS) 2.5 % ophthalmic solution Place 1 drop into both eyes 4 (four) times daily as needed.        . insulin detemir (LEVEMIR) 100 UNIT/ML injection Inject 30 Units into the skin 1 day or 1 dose. Before breakfast       . lisinopril (PRINIVIL,ZESTRIL) 40 MG tablet Take 1 tablet (40 mg total) by mouth daily.  30 tablet  6  . metoprolol tartrate (LOPRESSOR) 50 MG tablet Take 1 tablet (50 mg total) by mouth 2 (two) times daily.  60 tablet  6  . nitroGLYCERIN (NITROSTAT) 0.4 MG SL tablet Place 1 tablet (0.4 mg total) under the tongue every 5 (five) minutes as needed for chest pain.  25 tablet  1  . prasugrel (EFFIENT) 10 MG TABS Take 1 tablet (10 mg total) by mouth daily.  30 tablet  6  . Pyridoxine  HCl (VITAMIN B-6) 500 MG tablet Take 500 mg by mouth daily.           Past Medical History  Diagnosis Date  . S/P CABG (coronary artery bypass graft) 2002    LIMA to LAD  . Tobacco abuse current    1/2 ppd smoker  . History of substance abuse     Alcohol, quit 1999.  Cocaine, quit late 1980's.  . Peptic ulcer disease   . Diabetes mellitus   . Stomach ulcer 1972    h/o antrectomy  . GERD (gastroesophageal reflux disease)   . Arthritis   . Carotid artery stenosis 10/2011    60-79% Right ICA stenosis  . High cholesterol   . Hypertension   . Coronary artery disease      Past Surgical History  Procedure Date  . Antrectomy [id number]  . Cholecystectomy 06/2010  . Fracture surgery   . Femur fracture surgery ~ 1964    left  . Coronary artery bypass graft 2002    CABG X1  . Coronary angioplasty with stent placement 11/06/11    2 DES to mid LCX and proximal RCA. 60% LAD stenosis after LIMA anastomosis  Family History  Problem Relation Age of Onset  . Stroke      grandparent  . Hypertension Mother     alive age 78 with pacemaker  . Coronary artery disease      grandparent  . Alcohol abuse Father     died in DWI accident when patient was a child     History   Social History  . Marital Status: Single    Spouse Name: N/A    Number of Children: N/A  . Years of Education: N/A   Occupational History  . Not on file.   Social History Main Topics  . Smoking status: Current Everyday Smoker -- 0.5 packs/day for 55 years    Types: Cigarettes  . Smokeless tobacco: Never Used  . 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 Drug Use: Yes    Special: Marijuana, Cocaine  . Sexually Active: Yes   Other Topics Concern  . Not on file   Social History Narrative  . No narrative on file     PHYSICAL EXAM   BP 147/89  Pulse 59  Ht 6' (1.829 m)  Wt  170 lb (77.111 kg)  BMI 23.06 kg/m2 Constitutional: He is oriented to person, place, and time. He appears well-developed and well-nourished. No distress.  HENT: No nasal discharge.  Head: Normocephalic and atraumatic.  Eyes: Pupils are equal and round. Right eye exhibits no discharge. Left eye exhibits no discharge.  Neck: Normal range of motion. Neck supple. No JVD present. No thyromegaly present.  Cardiovascular: Normal rate, regular rhythm, normal heart sounds and. Exam reveals no gallop and no friction rub. No murmur heard.  Pulmonary/Chest: Effort normal and breath sounds normal. No stridor. No respiratory distress. He has no wheezes. He has no rales. He exhibits no tenderness.  Abdominal: Soft. Bowel sounds are normal. He exhibits no distension. There is no tenderness. There is no rebound and no guarding.  Musculoskeletal: Normal range of motion. He exhibits no edema and no tenderness.  Neurological: He is alert and oriented to person, place, and time. Coordination normal.  Skin: Skin is warm and dry. No rash noted. He is not diaphoretic. No erythema. No pallor.  Psychiatric: He has a normal mood and affect. His behavior is normal. Judgment and thought content normal.    EKG: Normal sinus rhythm with no significant ST or T wave changes. Septal Q waves.   ASSESSMENT AND PLAN

## 2012-01-14 NOTE — Assessment & Plan Note (Signed)
The patient had one episode of chest pain which was mild but prolonged in the setting of uncontrolled hypertension. His blood pressure has been running high. I did increase the dose of lisinopril during his last visit to 40 mg daily. His ECG does not show any acute changes. Nonetheless, with his known cardiac history, I recommend further evaluation with a treadmill nuclear stress test. Continue dual antiplatelet therapy.

## 2012-01-14 NOTE — Assessment & Plan Note (Signed)
He does have known history of asymptomatic right ICA stenosis. I will request a followup ultrasound Doppler to be done in may of this year.

## 2012-01-19 ENCOUNTER — Telehealth: Payer: Self-pay | Admitting: Cardiovascular Disease

## 2012-01-19 DIAGNOSIS — L989 Disorder of the skin and subcutaneous tissue, unspecified: Secondary | ICD-10-CM

## 2012-01-19 NOTE — Telephone Encounter (Signed)
The patient had pain in his foot and was examined by Dr. Ulice Brilliant (podiatrist) who found 2 small lesions suggestive of microemboli. Pedal pulses are fine without signs of critical limb ischemia.  I will schedule the patient for ABI before his next visit. Continue Aspirin and Effient.

## 2012-01-20 NOTE — Telephone Encounter (Signed)
Addended by: Arlyss Gandy on: 01/20/2012 01:01 PM   Modules accepted: Orders

## 2012-01-20 NOTE — Telephone Encounter (Signed)
Pt is aware that test will be ordered and he will be notified of appt information.

## 2012-01-22 DIAGNOSIS — R079 Chest pain, unspecified: Secondary | ICD-10-CM

## 2012-01-27 ENCOUNTER — Telehealth: Payer: Self-pay | Admitting: *Deleted

## 2012-01-27 NOTE — Telephone Encounter (Signed)
Patient informed. 

## 2012-01-27 NOTE — Telephone Encounter (Signed)
Message copied by Eustace Moore on Wed Jan 27, 2012  2:38 PM ------      Message from: Lorine Bears A      Created: Mon Jan 25, 2012  4:42 PM       Inform patient that his stress test was normal.

## 2012-02-04 ENCOUNTER — Encounter (INDEPENDENT_AMBULATORY_CARE_PROVIDER_SITE_OTHER): Payer: Medicare Other

## 2012-02-04 ENCOUNTER — Encounter: Payer: Self-pay | Admitting: Cardiovascular Disease

## 2012-02-04 DIAGNOSIS — E1159 Type 2 diabetes mellitus with other circulatory complications: Secondary | ICD-10-CM

## 2012-02-04 DIAGNOSIS — L97909 Non-pressure chronic ulcer of unspecified part of unspecified lower leg with unspecified severity: Secondary | ICD-10-CM

## 2012-02-04 DIAGNOSIS — L989 Disorder of the skin and subcutaneous tissue, unspecified: Secondary | ICD-10-CM

## 2012-02-08 ENCOUNTER — Encounter: Payer: Self-pay | Admitting: Cardiovascular Disease

## 2012-02-08 ENCOUNTER — Ambulatory Visit (INDEPENDENT_AMBULATORY_CARE_PROVIDER_SITE_OTHER): Payer: Medicare Other | Admitting: Cardiovascular Disease

## 2012-02-08 ENCOUNTER — Other Ambulatory Visit: Payer: Self-pay | Admitting: Cardiovascular Disease

## 2012-02-08 DIAGNOSIS — I739 Peripheral vascular disease, unspecified: Secondary | ICD-10-CM

## 2012-02-08 DIAGNOSIS — I719 Aortic aneurysm of unspecified site, without rupture: Secondary | ICD-10-CM

## 2012-02-08 DIAGNOSIS — Z79899 Other long term (current) drug therapy: Secondary | ICD-10-CM

## 2012-02-08 DIAGNOSIS — I6529 Occlusion and stenosis of unspecified carotid artery: Secondary | ICD-10-CM

## 2012-02-08 DIAGNOSIS — I2510 Atherosclerotic heart disease of native coronary artery without angina pectoris: Secondary | ICD-10-CM

## 2012-02-08 DIAGNOSIS — E7800 Pure hypercholesterolemia, unspecified: Secondary | ICD-10-CM

## 2012-02-08 DIAGNOSIS — I779 Disorder of arteries and arterioles, unspecified: Secondary | ICD-10-CM

## 2012-02-08 DIAGNOSIS — I10 Essential (primary) hypertension: Secondary | ICD-10-CM

## 2012-02-08 NOTE — Patient Instructions (Signed)
Your physician wants you to follow-up in: 6 months. You will receive a reminder letter in the mail one-two months in advance. If you don't receive a letter, please call our office to schedule the follow-up appointment. Your physician recommends that you continue on your current medications as directed. Please refer to the Current Medication list given to you today. Non-Cardiac CT Angiography (CTA), is a special type of CT scan that uses a computer to produce multi-dimensional views of major blood vessels throughout the body. In CT angiography, a contrast material is injected through an IV to help visualize the blood vessels  Your physician has requested that you have a carotid duplex. This test is an ultrasound of the carotid arteries in your neck. It looks at blood flow through these arteries that supply the brain with blood. Allow one hour for this exam. There are no restrictions or special instructions. If the results of your test are normal or stable, you will receive a letter. If they are abnormal, the nurse will contact you by phone.

## 2012-02-08 NOTE — Assessment & Plan Note (Signed)
The patient has 2 small lesions on the right heel suggestive of microemboli. His ABI was normal bilaterally but he does have evidence of small vessel disease which might be contributing. However, and embolic aortic etiology would need to be ruled out. We have to make sure he does not have aortic aneurysm with thrombus. He has prolonged history of smoking. The patient has no history of atrial fibrillation. I recommend obtaining a CTA of the aorta for further evaluation. Continue dual antiplatelet therapy. I advised him to quit smoking.

## 2012-02-08 NOTE — Assessment & Plan Note (Signed)
He is going to have followup labs soon. His Crestor dose was decreased to 20 mg daily due to slightly elevated liver function tests. This is being followed by Dr. Lysbeth Galas his primary care physician.

## 2012-02-08 NOTE — Assessment & Plan Note (Signed)
The patient had recent chest pain which has resolved. His stress test was normal. His blood pressure is now improved. Continue current therapy.

## 2012-02-08 NOTE — Assessment & Plan Note (Signed)
His blood pressure is now well controlled. Continue current medications. 

## 2012-02-08 NOTE — Progress Notes (Signed)
HPI  This is a 66 year old male who is here today for a followup visit. I saw him recently for chest pain in the setting of uncontrolled hypertension. I started him on amlodipine and requested a nuclear stress test. The stress test was done and showed no evidence of ischemia with normal ejection fraction. His blood pressure is now controlled. He has not had any further chest pain. I was called few weeks ago by his podiatrist Dr. Ulice Brilliant regarding 2 lesions on the right heel which were painful and suggestive of microemboli. I scheduled the patient for an ABI which was done and was basically normal except for decreased to pressure bilaterally suggestive of small vessel disease. The patient is not aware of previous history of PAD. He has no clear claudications. He continues to smoke but is trying to quit. He has no history of atrial fibrillation.  No Known Allergies   Current Outpatient Prescriptions on File Prior to Visit  Medication Sig Dispense Refill  . amLODipine (NORVASC) 5 MG tablet Take 1 tablet (5 mg total) by mouth daily.  30 tablet  6  . Ascorbic Acid (VITAMIN C) 1000 MG tablet Take 1,000 mg by mouth daily.        Marland Kitchen aspirin EC 81 MG tablet Take 81 mg by mouth daily.        Marland Kitchen Cod Liver Oil CAPS Take by mouth.        . fish oil-omega-3 fatty acids 1000 MG capsule Take 2 g by mouth daily.        . folic acid (FOLVITE) 400 MCG tablet Take 400 mcg by mouth daily.        . hydroxypropyl methylcellulose (ISOPTO TEARS) 2.5 % ophthalmic solution Place 1 drop into both eyes 4 (four) times daily as needed.        . insulin detemir (LEVEMIR) 100 UNIT/ML injection Inject 30 Units into the skin 1 day or 1 dose. Before breakfast       . lisinopril (PRINIVIL,ZESTRIL) 40 MG tablet Take 1 tablet (40 mg total) by mouth daily.  30 tablet  6  . metoprolol tartrate (LOPRESSOR) 50 MG tablet Take 1 tablet (50 mg total) by mouth 2 (two) times daily.  60 tablet  6  . nitroGLYCERIN (NITROSTAT) 0.4 MG SL tablet  Place 1 tablet (0.4 mg total) under the tongue every 5 (five) minutes as needed for chest pain.  25 tablet  1  . prasugrel (EFFIENT) 10 MG TABS Take 1 tablet (10 mg total) by mouth daily.  30 tablet  6  . Pyridoxine HCl (VITAMIN B-6) 500 MG tablet Take 500 mg by mouth daily.        . rosuvastatin (CRESTOR) 40 MG tablet Take 20 mg by mouth daily at 6 PM.         Past Medical History  Diagnosis Date  . S/P CABG (coronary artery bypass graft) 2002    LIMA to LAD  . Tobacco abuse current    1/2 ppd smoker  . History of substance abuse     Alcohol, quit 1999.  Cocaine, quit late 1980's.  . Peptic ulcer disease   . Diabetes mellitus   . Stomach ulcer 1972    h/o antrectomy  . GERD (gastroesophageal reflux disease)   . Arthritis   . Carotid artery stenosis 10/2011    60-79% Right ICA stenosis  . High cholesterol   . Hypertension   . Coronary artery disease      Past  Surgical History  Procedure Date  . Antrectomy [id number]  . Cholecystectomy 06/2010  . Fracture surgery   . Femur fracture surgery ~ 1964    left  . Coronary artery bypass graft 2002    CABG X1  . Coronary angioplasty with stent placement 11/06/11    2 DES to mid LCX and proximal RCA. 60% LAD stenosis after LIMA anastomosis     Family History  Problem Relation Age of Onset  . Stroke      grandparent  . Hypertension Mother     alive age 62 with pacemaker  . Coronary artery disease      grandparent  . Alcohol abuse Father     died in DWI accident when patient was a child     History   Social History  . Marital Status: Single    Spouse Name: N/A    Number of Children: N/A  . Years of Education: N/A   Occupational History  . Not on file.   Social History Main Topics  . Smoking status: Current Everyday Smoker -- 0.5 packs/day for 55 years    Types: Cigarettes  . Smokeless tobacco: Never Used  . 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 Drug Use: Yes    Special: Marijuana, Cocaine  . Sexually Active: Yes   Other Topics Concern  . Not on file   Social History Narrative  . No narrative on file     PHYSICAL EXAM   BP 128/73  Pulse 60  Ht 6' (1.829 m)  Wt 168 lb (76.204 kg)  BMI 22.78 kg/m2  SpO2 97%  Constitutional: He is oriented to person, place, and time. He appears well-developed and well-nourished. No distress.  HENT: No nasal discharge.  Head: Normocephalic and atraumatic.  Eyes: Pupils are equal and round. Right eye exhibits no discharge. Left eye exhibits no discharge.  Neck: Normal range of motion. Neck supple. No JVD present. No thyromegaly present.  Cardiovascular: Normal rate, regular rhythm, normal heart sounds and. Exam reveals no gallop and no friction rub. No murmur heard.  Pulmonary/Chest: Effort normal and breath sounds normal. No stridor. No respiratory distress. He has no wheezes. He has no rales. He exhibits no tenderness.  Abdominal: Soft. Bowel sounds are normal. He exhibits no distension. There is no tenderness. There is no rebound and no guarding.  Musculoskeletal: Normal range of motion. He exhibits no edema and no tenderness.  Neurological: He is alert and oriented to person, place, and time. Coordination normal.  Skin: Skin is warm and dry. No rash noted. He is not diaphoretic. No erythema. No pallor.  Psychiatric: He has a normal mood and affect. His behavior is normal. Judgment and thought content normal.  Vascular: Pedal pulses are normal bilaterally. 2 small skin lesions are noted at the right heel which tender to touch.      ASSESSMENT AND PLAN

## 2012-02-08 NOTE — Assessment & Plan Note (Signed)
The patient has a moderate asymptomatic right ICA stenosis. A followup ultrasound Doppler will be scheduled in may. Continue aggressive medical therapy.

## 2012-02-16 ENCOUNTER — Telehealth: Payer: Self-pay | Admitting: *Deleted

## 2012-02-16 NOTE — Telephone Encounter (Signed)
Pt has Medicare and AARP Medicare Supplement.  No precert required. °

## 2012-02-16 NOTE — Telephone Encounter (Signed)
CT Angio Abd/Pel w/ and/or w/o CT Angio Chest  w and w/o   Scheduled for 02-24-2012 @ [facility] Checking percert

## 2012-02-25 ENCOUNTER — Telehealth: Payer: Self-pay | Admitting: *Deleted

## 2012-02-25 NOTE — Telephone Encounter (Signed)
Message copied by Arlyss Gandy on Thu Feb 25, 2012  4:25 PM ------      Message from: Prescott Parma C      Created: Thu Feb 25, 2012  2:50 PM       Neg study for PE or AAA/dissection

## 2012-02-25 NOTE — Telephone Encounter (Signed)
Pt notified of CT results and verbalized understanding.

## 2012-04-21 ENCOUNTER — Encounter (INDEPENDENT_AMBULATORY_CARE_PROVIDER_SITE_OTHER): Payer: Medicare Other

## 2012-04-21 DIAGNOSIS — I6529 Occlusion and stenosis of unspecified carotid artery: Secondary | ICD-10-CM

## 2012-06-01 ENCOUNTER — Other Ambulatory Visit: Payer: Self-pay | Admitting: Physician Assistant

## 2012-07-01 ENCOUNTER — Other Ambulatory Visit: Payer: Self-pay | Admitting: Physician Assistant

## 2012-07-18 ENCOUNTER — Other Ambulatory Visit: Payer: Self-pay | Admitting: Cardiovascular Disease

## 2012-08-10 ENCOUNTER — Ambulatory Visit (INDEPENDENT_AMBULATORY_CARE_PROVIDER_SITE_OTHER): Payer: Medicare Other | Admitting: Cardiovascular Disease

## 2012-08-10 ENCOUNTER — Encounter: Payer: Self-pay | Admitting: Cardiovascular Disease

## 2012-08-10 VITALS — BP 139/82 | HR 57 | Ht 72.0 in | Wt 166.1 lb

## 2012-08-10 DIAGNOSIS — E7800 Pure hypercholesterolemia, unspecified: Secondary | ICD-10-CM

## 2012-08-10 DIAGNOSIS — I6529 Occlusion and stenosis of unspecified carotid artery: Secondary | ICD-10-CM

## 2012-08-10 DIAGNOSIS — I10 Essential (primary) hypertension: Secondary | ICD-10-CM

## 2012-08-10 DIAGNOSIS — I2510 Atherosclerotic heart disease of native coronary artery without angina pectoris: Secondary | ICD-10-CM

## 2012-08-10 MED ORDER — CARVEDILOL 6.25 MG PO TABS: 6.2500 mg | ORAL_TABLET | Freq: Two times a day (BID) | ORAL | Status: DC

## 2012-08-10 NOTE — Patient Instructions (Addendum)
Your physician has recommended you make the following change in your medication: stop taking Metoprolol and start taking Coreg 6.25 mg twice daily  Your physician wants you to follow-up in: 6 months. You will receive a reminder letter in the mail two months in advance. If you don't receive a letter, please call our office to schedule the follow-up appointment.

## 2012-08-16 ENCOUNTER — Other Ambulatory Visit: Payer: Self-pay | Admitting: Cardiovascular Disease

## 2012-08-16 NOTE — Assessment & Plan Note (Signed)
Lab Results  Component Value Date   CHOL 103 11/06/2011   HDL 53 11/06/2011   LDLCALC 39 11/06/2011   TRIG 55 11/06/2011   CHOLHDL 1.9 11/06/2011   Due to his extensive atherosclerosis, I recommend continuing high-dose statin.

## 2012-08-16 NOTE — Assessment & Plan Note (Addendum)
He is doing very well at this time with no evidence of angina. Continue medical therapy. I recommend lifelong dual antiplatelet therapy if possible. However,Effient can be changed to Plavix after one year of treatment. That would be in December of 2013.

## 2012-08-16 NOTE — Progress Notes (Signed)
HPI  This is a 66 year old male who is here today for a followup visit. He has known history of coronary artery disease status post one-vessel CABG in 2002 with LIMA to LAD. He presented in December 2012 with non-ST elevation myocardial infarction. Cardiac catheterization showed significant native vessel disease in the RCA and left circumflex. He underwent angioplasty and drug-eluting stent placement to both. He had some issues with high blood pressure during his last visit but improved after the addition of amlodipine. He also had some chest discomfort that was evaluated with a nuclear stress test which came back normal. He had 2 lesions on the right heel which were painful and suggestive of microemboli. ABI was normal except for decreased toe pressure bilaterally suggestive of small vessel disease. There was no clear evidence of a source of emboli. These lesions resolves spontaneously. Overall, he is doing well at this time. He denies chest pain or dyspnea. His only issue today is fatigue and having no energy.  No Known Allergies   Current Outpatient Prescriptions on File Prior to Visit  Medication Sig Dispense Refill  . amLODipine (NORVASC) 5 MG tablet Take 1 tablet (5 mg total) by mouth daily.  30 tablet  6  . Ascorbic Acid (VITAMIN C) 1000 MG tablet Take 1,000 mg by mouth daily.        Marland Kitchen aspirin EC 81 MG tablet Take 81 mg by mouth daily.        Marland Kitchen Cod Liver Oil CAPS Take by mouth.        . EFFIENT 10 MG TABS TAKE ONE TABLET BY MOUTH EVERY DAY  30 each  5  . fish oil-omega-3 fatty acids 1000 MG capsule Take 2 g by mouth daily.        . folic acid (FOLVITE) 400 MCG tablet Take 400 mcg by mouth daily.        . hydroxypropyl methylcellulose (ISOPTO TEARS) 2.5 % ophthalmic solution Place 1 drop into both eyes 4 (four) times daily as needed.        . insulin detemir (LEVEMIR) 100 UNIT/ML injection Inject 40 Units into the skin 1 day or 1 dose. Before breakfast      . lisinopril  (PRINIVIL,ZESTRIL) 40 MG tablet TAKE ONE TABLET BY MOUTH EVERY DAY  30 tablet  9  . nitroGLYCERIN (NITROSTAT) 0.4 MG SL tablet Place 1 tablet (0.4 mg total) under the tongue every 5 (five) minutes as needed for chest pain.  25 tablet  1  . Pyridoxine HCl (VITAMIN B-6) 500 MG tablet Take 500 mg by mouth daily.        . rosuvastatin (CRESTOR) 40 MG tablet Take 20 mg by mouth daily at 6 PM.      . carvedilol (COREG) 6.25 MG tablet Take 1 tablet (6.25 mg total) by mouth 2 (two) times daily.  60 tablet  6     Past Medical History  Diagnosis Date  . S/P CABG (coronary artery bypass graft) 2002    LIMA to LAD  . Tobacco abuse current    1/2 ppd smoker  . History of substance abuse     Alcohol, quit 1999.  Cocaine, quit late 1980's.  . Peptic ulcer disease   . Diabetes mellitus   . Stomach ulcer 1972    h/o antrectomy  . GERD (gastroesophageal reflux disease)   . Arthritis   . Carotid artery stenosis 10/2011    60-79% Right ICA stenosis  . High cholesterol   .  Hypertension   . Coronary artery disease      Past Surgical History  Procedure Date  . Antrectomy [id number]  . Cholecystectomy 06/2010  . Fracture surgery   . Femur fracture surgery ~ 1964    left  . Coronary artery bypass graft 2002    CABG X1  . Coronary angioplasty with stent placement 11/06/11    2 DES to mid LCX and proximal RCA. 60% LAD stenosis after LIMA anastomosis     Family History  Problem Relation Age of Onset  . Stroke      grandparent  . Hypertension Mother     alive age 42 with pacemaker  . Coronary artery disease      grandparent  . Alcohol abuse Father     died in DWI accident when patient was a child     History   Social History  . Marital Status: Single    Spouse Name: N/A    Number of Children: N/A  . Years of Education: N/A   Occupational History  . Not on file.   Social History Main Topics  . Smoking status: Current Every Day Smoker -- 0.5 packs/day for 55 years    Types: Cigarettes   . Smokeless tobacco: Never Used  . 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 Drug Use: Yes    Special: Marijuana, Cocaine  . Sexually Active: Yes   Other Topics Concern  . Not on file   Social History Narrative  . No narrative on file     PHYSICAL EXAM   BP 139/82  Pulse 57  Ht 6' (1.829 m)  Wt 166 lb 1.9 oz (75.352 kg)  BMI 22.53 kg/m2 Constitutional: He is oriented to person, place, and time. He appears well-developed and well-nourished. No distress.  HENT: No nasal discharge.  Head: Normocephalic and atraumatic.  Eyes: Pupils are equal and round. Right eye exhibits no discharge. Left eye exhibits no discharge.  Neck: Normal range of motion. Neck supple. No JVD present. No thyromegaly present.  Cardiovascular: Normal rate, regular rhythm, normal heart sounds and. Exam reveals no gallop and no friction rub. No murmur heard.  Pulmonary/Chest: Effort normal and breath sounds normal. No stridor. No respiratory distress. He has no wheezes. He has no rales. He exhibits no tenderness.  Abdominal: Soft. Bowel sounds are normal. He exhibits no distension. There is no tenderness. There is no rebound and no guarding.  Musculoskeletal: Normal range of motion. He exhibits no edema and no tenderness.  Neurological: He is alert and oriented to person, place, and time. Coordination normal.  Skin: Skin is warm and dry. No rash noted. He is not diaphoretic. No erythema. No pallor.  Psychiatric: He has a normal mood and affect. His behavior is normal. Judgment and thought content normal.       ASSESSMENT AND PLAN

## 2012-08-16 NOTE — Assessment & Plan Note (Signed)
The patient has a moderate asymptomatic right ICA stenosis. A repeat ultrasound Doppler in May showed a 40-59% stenosis. Continue aggressive medical therapy. Repeat study in one year (May ,2014).

## 2012-08-16 NOTE — Assessment & Plan Note (Signed)
His blood pressure is reasonably controlled. He is complaining of fatigue which might be a side effect of metoprolol. Thus, I will change this to carvedilol 6.25 mg twice daily.

## 2012-11-16 ENCOUNTER — Other Ambulatory Visit: Payer: Self-pay | Admitting: Physician Assistant

## 2012-12-30 ENCOUNTER — Other Ambulatory Visit: Payer: Self-pay | Admitting: Cardiology

## 2012-12-30 MED ORDER — PRASUGREL HCL 10 MG PO TABS: 10.0000 mg | ORAL_TABLET | Freq: Every day | ORAL | Status: DC

## 2013-02-08 ENCOUNTER — Ambulatory Visit (INDEPENDENT_AMBULATORY_CARE_PROVIDER_SITE_OTHER): Payer: Medicare Other | Admitting: Cardiovascular Disease

## 2013-02-08 ENCOUNTER — Encounter: Payer: Self-pay | Admitting: Cardiovascular Disease

## 2013-02-08 VITALS — BP 153/88 | HR 75 | Ht 72.0 in | Wt 166.1 lb

## 2013-02-08 DIAGNOSIS — I10 Essential (primary) hypertension: Secondary | ICD-10-CM

## 2013-02-08 DIAGNOSIS — Z720 Tobacco use: Secondary | ICD-10-CM

## 2013-02-08 DIAGNOSIS — I6529 Occlusion and stenosis of unspecified carotid artery: Secondary | ICD-10-CM

## 2013-02-08 DIAGNOSIS — I6521 Occlusion and stenosis of right carotid artery: Secondary | ICD-10-CM

## 2013-02-08 DIAGNOSIS — E7800 Pure hypercholesterolemia, unspecified: Secondary | ICD-10-CM

## 2013-02-08 DIAGNOSIS — F17200 Nicotine dependence, unspecified, uncomplicated: Secondary | ICD-10-CM

## 2013-02-08 DIAGNOSIS — I2510 Atherosclerotic heart disease of native coronary artery without angina pectoris: Secondary | ICD-10-CM

## 2013-02-08 MED ORDER — ASPIRIN EC 325 MG PO TBEC: 325.0000 mg | DELAYED_RELEASE_TABLET | Freq: Every day | ORAL | Status: DC

## 2013-02-08 MED ORDER — CARVEDILOL 12.5 MG PO TABS: 12.5000 mg | ORAL_TABLET | Freq: Two times a day (BID) | ORAL | Status: DC

## 2013-02-08 NOTE — Patient Instructions (Addendum)
Your physician has recommended you make the following change in your medication: STOP Effient.  START Aspirin 325 mg daily.  INCREASE your Coreg (Carvedilol) to 12.5 mg twice daily  Your physician has requested that you have a carotid duplex. This test is an ultrasound of the carotid arteries in your neck. It looks at blood flow through these arteries that supply the brain with blood. Allow one hour for this exam. There are no restrictions or special instructions.  Your physician wants you to follow-up in: 1 year.   You will receive a reminder letter in the mail two months in advance. If you don't receive a letter, please call our office to schedule the follow-up appointment.

## 2013-02-09 ENCOUNTER — Encounter: Payer: Self-pay | Admitting: Cardiovascular Disease

## 2013-02-09 NOTE — Assessment & Plan Note (Signed)
He is doing well with no symptoms suggestive of angina. He is complaining of increased bruising while on Effient. Stenting was more than a year ago and thus this can be stopped. He is to start aspirin 325 mg once daily. Continue other medications.

## 2013-02-09 NOTE — Assessment & Plan Note (Addendum)
His blood pressure is elevated. I will increase carvedilol to 12.5 mg twice daily
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
## 2013-02-09 NOTE — Assessment & Plan Note (Signed)
Continue treatment with rosuvastatin. 

## 2013-02-09 NOTE — Assessment & Plan Note (Signed)
I again discussed with him the importance of smoking cessation. He will try that and the smoking cessation classes at [facility].

## 2013-02-09 NOTE — Assessment & Plan Note (Signed)
He has known moderate right ICA stenosis. A followup carotid duplex will be requested to be done in may.

## 2013-02-16 ENCOUNTER — Other Ambulatory Visit: Payer: Self-pay | Admitting: Cardiovascular Disease

## 2013-03-20 ENCOUNTER — Other Ambulatory Visit: Payer: Self-pay | Admitting: Physician Assistant

## 2013-03-21 ENCOUNTER — Other Ambulatory Visit: Payer: Self-pay | Admitting: Cardiovascular Disease

## 2013-03-22 ENCOUNTER — Other Ambulatory Visit: Payer: Self-pay

## 2013-04-06 ENCOUNTER — Ambulatory Visit (HOSPITAL_COMMUNITY)
Admission: RE | Admit: 2013-04-06 | Discharge: 2013-04-06 | Disposition: A | Discharge status: Home / Self Care | Payer: Medicare Other | Source: Ambulatory Visit | Attending: Adult Health Nurse Practitioner | Admitting: Adult Health Nurse Practitioner

## 2013-04-06 ENCOUNTER — Other Ambulatory Visit (HOSPITAL_COMMUNITY): Payer: Self-pay | Admitting: Adult Health Nurse Practitioner

## 2013-04-06 DIAGNOSIS — M11869 Other specified crystal arthropathies, unspecified knee: Secondary | ICD-10-CM | POA: Insufficient documentation

## 2013-04-06 DIAGNOSIS — M25569 Pain in unspecified knee: Secondary | ICD-10-CM | POA: Insufficient documentation

## 2013-04-06 DIAGNOSIS — M25561 Pain in right knee: Secondary | ICD-10-CM

## 2013-04-06 DIAGNOSIS — M25469 Effusion, unspecified knee: Secondary | ICD-10-CM | POA: Insufficient documentation

## 2013-04-10 ENCOUNTER — Encounter (INDEPENDENT_AMBULATORY_CARE_PROVIDER_SITE_OTHER): Payer: Medicare Other

## 2013-04-10 DIAGNOSIS — I2510 Atherosclerotic heart disease of native coronary artery without angina pectoris: Secondary | ICD-10-CM

## 2013-04-10 DIAGNOSIS — I6529 Occlusion and stenosis of unspecified carotid artery: Secondary | ICD-10-CM

## 2013-04-18 ENCOUNTER — Other Ambulatory Visit: Payer: Self-pay | Admitting: Orthopedic Surgery

## 2013-04-18 ENCOUNTER — Ambulatory Visit
Admission: RE | Admit: 2013-04-18 | Discharge: 2013-04-18 | Disposition: A | Discharge status: Home / Self Care | Payer: Medicare Other | Source: Ambulatory Visit | Attending: Orthopedic Surgery | Admitting: Orthopedic Surgery

## 2013-04-18 DIAGNOSIS — M25561 Pain in right knee: Secondary | ICD-10-CM

## 2013-04-18 DIAGNOSIS — Z77018 Contact with and (suspected) exposure to other hazardous metals: Secondary | ICD-10-CM

## 2013-04-19 ENCOUNTER — Other Ambulatory Visit: Payer: Self-pay | Admitting: Cardiovascular Disease

## 2013-04-22 ENCOUNTER — Other Ambulatory Visit: Payer: Medicare Other

## 2013-05-22 ENCOUNTER — Telehealth: Payer: Self-pay | Admitting: Cardiovascular Disease

## 2013-05-22 NOTE — Telephone Encounter (Signed)
Please advise thanks.

## 2013-05-22 NOTE — Telephone Encounter (Signed)
05-21-13 gwen with dr handy's office requesting surgical clearance for arthroscopic knee surgery , fax [phone] office [phone]

## 2013-05-24 NOTE — Telephone Encounter (Signed)
Please advise 

## 2013-05-24 NOTE — Telephone Encounter (Signed)
He is at low risk for surgery from a cardiac standpoint. He had a stress test last year which was normal.  He is to continue Aspirin daily due to previous stenting.

## 2013-05-24 NOTE — Telephone Encounter (Signed)
New problem   Aaron Santiago/Dr Carola Frost want to know about the cardiac clearance the pt need to have knee sx. Need to know about the clearance. Please call

## 2013-05-24 NOTE — Telephone Encounter (Signed)
Letter faxed.

## 2013-05-26 ENCOUNTER — Other Ambulatory Visit: Payer: Self-pay | Admitting: *Deleted

## 2013-05-26 MED ORDER — LISINOPRIL 40 MG PO TABS: ORAL_TABLET | ORAL | Status: DC

## 2013-05-26 MED ORDER — AMLODIPINE BESYLATE 5 MG PO TABS: ORAL_TABLET | ORAL | Status: DC

## 2013-06-01 NOTE — Pre-Procedure Instructions (Signed)
Aaron Santiago  06/01/2013   Your procedure is scheduled on:  Tuesday June 06, 2013  Report to [facility] Short Stay Center at 0600 AM. Come to main entrance "A" and go to east elevators up to 3rd floor. Check in at Short Stay desk.  Call this number if you have problems the morning of surgery: [phone]   Remember:   Do not eat food or drink liquids after midnight.   Take these medicines the morning of surgery with A SIP OF WATER: Amlodipine, and Coreg    Do not wear jewelry.  Do not wear lotions, powders, or perfumes. You may wear deodorant.  Do not shave 48 hours prior to surgery. Men may shave face and neck.  Do not bring valuables to the hospital.  [facility] is not responsible                   for any belongings or valuables.  Contacts, dentures or bridgework may not be worn into surgery.  Leave suitcase in the car. After surgery it may be brought to your room.  For patients admitted to the hospital, checkout time is 11:00 AM the day of  discharge.   Patients discharged the day of surgery will not be allowed to drive  home.    Special Instructions: Shower using CHG 2 nights before surgery and the night before surgery.  If you shower the day of surgery use CHG.  Use special wash - you have one bottle of CHG for all showers.  You should use approximately 1/3 of the bottle for each shower.   Please read over the following fact sheets that you were given: Pain Booklet, Coughing and Deep Breathing and Surgical Site Infection Prevention

## 2013-06-05 ENCOUNTER — Encounter (HOSPITAL_COMMUNITY)
Admission: RE | Admit: 2013-06-05 | Discharge: 2013-06-05 | Disposition: A | Discharge status: Home / Self Care | Payer: Medicare Other | Source: Ambulatory Visit | Attending: Orthopedic Surgery | Admitting: Orthopedic Surgery

## 2013-06-05 ENCOUNTER — Encounter (HOSPITAL_COMMUNITY): Payer: Self-pay

## 2013-06-05 HISTORY — DX: Other seasonal allergic rhinitis: J30.2

## 2013-06-05 LAB — SURGICAL PCR SCREEN
MRSA, PCR: NEGATIVE
Staphylococcus aureus: NEGATIVE

## 2013-06-05 LAB — CBC
MCHC: 34.5 g/dL (ref 30.0–36.0)
Platelets: 170 10*3/uL (ref 150–400)
RDW: 14.3 % (ref 11.5–15.5)

## 2013-06-05 LAB — BASIC METABOLIC PANEL
BUN: 19 mg/dL (ref 6–23)
Calcium: 9.5 mg/dL (ref 8.4–10.5)
Creatinine, Ser: 0.91 mg/dL (ref 0.50–1.35)
GFR calc Af Amer: >90 mL/min (ref >90)
GFR calc non Af Amer: 86 mL/min — ABNORMAL LOW (ref >90)
Sodium: 139 mEq/L (ref 135–145)

## 2013-06-06 ENCOUNTER — Ambulatory Visit (HOSPITAL_COMMUNITY)
Admission: RE | Admit: 2013-06-06 | Discharge: 2013-06-06 | Disposition: A | Discharge status: Home / Self Care | Payer: Medicare Other | Source: Ambulatory Visit | Attending: Orthopedic Surgery | Admitting: Orthopedic Surgery

## 2013-06-06 ENCOUNTER — Ambulatory Visit (HOSPITAL_COMMUNITY): Payer: Medicare Other | Admitting: Anesthesiology

## 2013-06-06 ENCOUNTER — Encounter (HOSPITAL_COMMUNITY)
Admission: RE | Disposition: A | Discharge status: Home / Self Care | Payer: Self-pay | Source: Ambulatory Visit | Attending: Orthopedic Surgery

## 2013-06-06 ENCOUNTER — Encounter (HOSPITAL_COMMUNITY): Payer: Self-pay | Admitting: *Deleted

## 2013-06-06 ENCOUNTER — Encounter (HOSPITAL_COMMUNITY): Payer: Self-pay | Admitting: Anesthesiology

## 2013-06-06 DIAGNOSIS — I6529 Occlusion and stenosis of unspecified carotid artery: Secondary | ICD-10-CM | POA: Insufficient documentation

## 2013-06-06 DIAGNOSIS — M23305 Other meniscus derangements, unspecified medial meniscus, unspecified knee: Secondary | ICD-10-CM | POA: Insufficient documentation

## 2013-06-06 DIAGNOSIS — J309 Allergic rhinitis, unspecified: Secondary | ICD-10-CM | POA: Insufficient documentation

## 2013-06-06 DIAGNOSIS — M2240 Chondromalacia patellae, unspecified knee: Secondary | ICD-10-CM | POA: Insufficient documentation

## 2013-06-06 DIAGNOSIS — E119 Type 2 diabetes mellitus without complications: Secondary | ICD-10-CM | POA: Insufficient documentation

## 2013-06-06 DIAGNOSIS — M1710 Unilateral primary osteoarthritis, unspecified knee: Secondary | ICD-10-CM | POA: Insufficient documentation

## 2013-06-06 DIAGNOSIS — I10 Essential (primary) hypertension: Secondary | ICD-10-CM | POA: Insufficient documentation

## 2013-06-06 DIAGNOSIS — E785 Hyperlipidemia, unspecified: Secondary | ICD-10-CM | POA: Insufficient documentation

## 2013-06-06 DIAGNOSIS — M129 Arthropathy, unspecified: Secondary | ICD-10-CM | POA: Insufficient documentation

## 2013-06-06 DIAGNOSIS — Z79899 Other long term (current) drug therapy: Secondary | ICD-10-CM | POA: Insufficient documentation

## 2013-06-06 DIAGNOSIS — F17200 Nicotine dependence, unspecified, uncomplicated: Secondary | ICD-10-CM | POA: Insufficient documentation

## 2013-06-06 DIAGNOSIS — K219 Gastro-esophageal reflux disease without esophagitis: Secondary | ICD-10-CM | POA: Insufficient documentation

## 2013-06-06 HISTORY — PX: KNEE ARTHROSCOPY WITH MEDIAL MENISECTOMY: SHX5651

## 2013-06-06 LAB — GLUCOSE, CAPILLARY
Glucose-Capillary: 95 mg/dL (ref 70–99)
Glucose-Capillary: 99 mg/dL (ref 70–99)

## 2013-06-06 SURGERY — ARTHROSCOPY, KNEE, WITH MEDIAL MENISCECTOMY
Anesthesia: General | Site: Knee | Laterality: Right | Wound class: Clean

## 2013-06-06 MED ORDER — OXYCODONE HCL 5 MG/5ML PO SOLN: 5.0000 mg | Freq: Once | ORAL | Status: DC | PRN

## 2013-06-06 MED ORDER — METHOCARBAMOL 500 MG PO TABS: 500.0000 mg | ORAL_TABLET | Freq: Four times a day (QID) | ORAL | Status: DC | PRN

## 2013-06-06 MED ORDER — MORPHINE SULFATE 4 MG/ML IJ SOLN
INTRAMUSCULAR | Status: DC | PRN
  Administered 2013-06-06: 4 mg

## 2013-06-06 MED ORDER — OXYCODONE HCL 5 MG PO TABS: 5.0000 mg | ORAL_TABLET | ORAL | Status: DC | PRN

## 2013-06-06 MED ORDER — FENTANYL CITRATE 0.05 MG/ML IJ SOLN
INTRAMUSCULAR | Status: DC | PRN
  Administered 2013-06-06: 75 ug via INTRAVENOUS
  Administered 2013-06-06 (×2): 25 ug via INTRAVENOUS

## 2013-06-06 MED ORDER — MORPHINE SULFATE 4 MG/ML IJ SOLN
INTRAMUSCULAR | Status: AC
  Filled 2013-06-06: qty 1

## 2013-06-06 MED ORDER — METHYLPREDNISOLONE ACETATE 40 MG/ML IJ SUSP
INTRAMUSCULAR | Status: DC | PRN
  Administered 2013-06-06: 40 mg

## 2013-06-06 MED ORDER — PROPOFOL 10 MG/ML IV BOLUS
INTRAVENOUS | Status: DC | PRN
  Administered 2013-06-06: 200 mg via INTRAVENOUS

## 2013-06-06 MED ORDER — METHOCARBAMOL 500 MG PO TABS
500.0000 mg | ORAL_TABLET | Freq: Once | ORAL | Status: AC
  Administered 2013-06-06: 500 mg via ORAL

## 2013-06-06 MED ORDER — BUPIVACAINE-EPINEPHRINE 0.5% -1:200000 IJ SOLN
INTRAMUSCULAR | Status: DC | PRN
  Administered 2013-06-06: 10 mL

## 2013-06-06 MED ORDER — PHENYLEPHRINE HCL 10 MG/ML IJ SOLN
INTRAMUSCULAR | Status: DC | PRN
  Administered 2013-06-06 (×3): 80 ug via INTRAVENOUS
  Administered 2013-06-06 (×2): 40 ug via INTRAVENOUS
  Administered 2013-06-06: 80 ug via INTRAVENOUS

## 2013-06-06 MED ORDER — MIDAZOLAM HCL 5 MG/5ML IJ SOLN
INTRAMUSCULAR | Status: DC | PRN
  Administered 2013-06-06: 1.5 mg via INTRAVENOUS
  Administered 2013-06-06: 0.5 mg via INTRAVENOUS

## 2013-06-06 MED ORDER — CEFAZOLIN SODIUM-DEXTROSE 2-3 GM-% IV SOLR
2.0000 g | Freq: Once | INTRAVENOUS | Status: AC
  Administered 2013-06-06: 2 g via INTRAVENOUS
  Filled 2013-06-06: qty 50

## 2013-06-06 MED ORDER — SODIUM CHLORIDE 0.9 % IR SOLN
Status: DC | PRN
  Administered 2013-06-06: 6000 mL

## 2013-06-06 MED ORDER — ONDANSETRON HCL 4 MG/2ML IJ SOLN
INTRAMUSCULAR | Status: DC | PRN
  Administered 2013-06-06: 4 mg via INTRAVENOUS

## 2013-06-06 MED ORDER — LACTATED RINGERS IV SOLN
INTRAVENOUS | Status: DC | PRN
  Administered 2013-06-06 (×2): via INTRAVENOUS

## 2013-06-06 MED ORDER — HYDROMORPHONE HCL PF 1 MG/ML IJ SOLN
INTRAMUSCULAR | Status: AC
  Filled 2013-06-06: qty 1

## 2013-06-06 MED ORDER — BUPIVACAINE HCL (PF) 0.25 % IJ SOLN
INTRAMUSCULAR | Status: AC
  Filled 2013-06-06: qty 30

## 2013-06-06 MED ORDER — METHOCARBAMOL 500 MG PO TABS
ORAL_TABLET | ORAL | Status: AC
  Filled 2013-06-06: qty 1

## 2013-06-06 MED ORDER — BUPIVACAINE-EPINEPHRINE PF 0.5-1:200000 % IJ SOLN
INTRAMUSCULAR | Status: AC
  Filled 2013-06-06: qty 30

## 2013-06-06 MED ORDER — OXYCODONE HCL 5 MG PO TABS
10.0000 mg | ORAL_TABLET | Freq: Once | ORAL | Status: AC
  Administered 2013-06-06: 10 mg via ORAL

## 2013-06-06 MED ORDER — PROMETHAZINE HCL 25 MG/ML IJ SOLN: 6.2500 mg | INTRAMUSCULAR | Status: DC | PRN

## 2013-06-06 MED ORDER — ALBUTEROL SULFATE HFA 108 (90 BASE) MCG/ACT IN AERS
INHALATION_SPRAY | RESPIRATORY_TRACT | Status: DC | PRN
  Administered 2013-06-06: 2 via RESPIRATORY_TRACT

## 2013-06-06 MED ORDER — ONDANSETRON HCL 4 MG PO TABS: 4.0000 mg | ORAL_TABLET | Freq: Three times a day (TID) | ORAL | Status: DC | PRN

## 2013-06-06 MED ORDER — METHYLPREDNISOLONE ACETATE 40 MG/ML IJ SUSP
INTRAMUSCULAR | Status: AC
  Filled 2013-06-06: qty 1

## 2013-06-06 MED ORDER — HYDROCODONE-ACETAMINOPHEN 5-325 MG PO TABS: 1.0000 | ORAL_TABLET | Freq: Four times a day (QID) | ORAL | Status: DC | PRN

## 2013-06-06 MED ORDER — OXYCODONE HCL 5 MG PO TABS: 5.0000 mg | ORAL_TABLET | Freq: Once | ORAL | Status: DC | PRN

## 2013-06-06 MED ORDER — GLYCOPYRROLATE 0.2 MG/ML IJ SOLN
INTRAMUSCULAR | Status: DC | PRN
  Administered 2013-06-06: 0.2 mg via INTRAVENOUS

## 2013-06-06 MED ORDER — OXYCODONE HCL 5 MG PO TABS
ORAL_TABLET | ORAL | Status: AC
  Filled 2013-06-06: qty 2

## 2013-06-06 MED ORDER — HYDROMORPHONE HCL PF 1 MG/ML IJ SOLN
0.2500 mg | INTRAMUSCULAR | Status: DC | PRN
  Administered 2013-06-06 (×2): 0.5 mg via INTRAVENOUS

## 2013-06-06 SURGICAL SUPPLY — 49 items
BANDAGE ELASTIC 4 VELCRO ST LF (GAUZE/BANDAGES/DRESSINGS) ×2 IMPLANT
BANDAGE ELASTIC 6 VELCRO ST LF (GAUZE/BANDAGES/DRESSINGS) ×2 IMPLANT
BANDAGE ESMARK 6X9 LF (GAUZE/BANDAGES/DRESSINGS) ×1 IMPLANT
BLADE CUDA 5.5 (BLADE) IMPLANT
BLADE GREAT WHITE 4.2 (BLADE) ×2 IMPLANT
BLADE SURG 11 STRL SS (BLADE) ×2 IMPLANT
BNDG CMPR 9X6 STRL LF SNTH (GAUZE/BANDAGES/DRESSINGS) ×1
BNDG ESMARK 6X9 LF (GAUZE/BANDAGES/DRESSINGS) ×2
BRUSH SCRUB DISP (MISCELLANEOUS) ×4 IMPLANT
CLOTH BEACON ORANGE TIMEOUT ST (SAFETY) ×2 IMPLANT
COVER SURGICAL LIGHT HANDLE (MISCELLANEOUS) ×2 IMPLANT
CUFF TOURNIQUET SINGLE 34IN LL (TOURNIQUET CUFF) ×2 IMPLANT
CUFF TOURNIQUET SINGLE 44IN (TOURNIQUET CUFF) IMPLANT
DRAPE ARTHROSCOPY W/POUCH 114 (DRAPES) ×2 IMPLANT
DRAPE U-SHAPE 47X51 STRL (DRAPES) ×2 IMPLANT
DRSG EMULSION OIL 3X3 NADH (GAUZE/BANDAGES/DRESSINGS) ×2 IMPLANT
DRSG PAD ABDOMINAL 8X10 ST (GAUZE/BANDAGES/DRESSINGS) ×2 IMPLANT
GAUZE SPONGE 4X4 16PLY XRAY LF (GAUZE/BANDAGES/DRESSINGS) ×2 IMPLANT
GLOVE BIO SURGEON STRL SZ7 (GLOVE) ×2 IMPLANT
GLOVE BIO SURGEON STRL SZ7.5 (GLOVE) IMPLANT
GLOVE BIO SURGEON STRL SZ8 (GLOVE) ×2 IMPLANT
GLOVE BIOGEL PI IND STRL 7.0 (GLOVE) ×1 IMPLANT
GLOVE BIOGEL PI IND STRL 7.5 (GLOVE) ×1 IMPLANT
GLOVE BIOGEL PI IND STRL 8 (GLOVE) ×1 IMPLANT
GLOVE BIOGEL PI INDICATOR 7.0 (GLOVE) ×1
GLOVE BIOGEL PI INDICATOR 7.5 (GLOVE) ×1
GLOVE BIOGEL PI INDICATOR 8 (GLOVE) ×1
GLOVE SURG SS PI 6.5 STRL IVOR (GLOVE) ×2 IMPLANT
GOWN PREVENTION PLUS XLARGE (GOWN DISPOSABLE) ×2 IMPLANT
GOWN STRL NON-REIN LRG LVL3 (GOWN DISPOSABLE) ×4 IMPLANT
KIT BASIN OR (CUSTOM PROCEDURE TRAY) ×2 IMPLANT
KIT ROOM TURNOVER OR (KITS) ×2 IMPLANT
MANIFOLD NEPTUNE II (INSTRUMENTS) ×2 IMPLANT
MARKER SKIN DUAL TIP RULER LAB (MISCELLANEOUS) ×2 IMPLANT
NEEDLE 18GX1X1/2 (RX/OR ONLY) (NEEDLE) ×2 IMPLANT
NEEDLE HYPO 25GX1X1/2 BEV (NEEDLE) ×2 IMPLANT
PACK ARTHROSCOPY DSU (CUSTOM PROCEDURE TRAY) ×2 IMPLANT
PAD ARMBOARD 7.5X6 YLW CONV (MISCELLANEOUS) ×2 IMPLANT
PADDING CAST COTTON 6X4 STRL (CAST SUPPLIES) ×2 IMPLANT
SET ARTHROSCOPY TUBING (MISCELLANEOUS) ×2
SET ARTHROSCOPY TUBING LN (MISCELLANEOUS) ×1 IMPLANT
SPONGE GAUZE 4X4 12PLY (GAUZE/BANDAGES/DRESSINGS) ×2 IMPLANT
SUT ETHILON 4 0 PS 2 18 (SUTURE) ×2 IMPLANT
SYR 3ML LL SCALE MARK (SYRINGE) ×2 IMPLANT
SYR CONTROL 10ML LL (SYRINGE) ×2 IMPLANT
TOWEL OR 17X24 6PK STRL BLUE (TOWEL DISPOSABLE) ×4 IMPLANT
TUBE CONNECTING 12X1/4 (SUCTIONS) IMPLANT
WAND 90 DEG TURBOVAC W/CORD (SURGICAL WAND) ×2 IMPLANT
WATER STERILE IRR 1000ML POUR (IV SOLUTION) ×2 IMPLANT

## 2013-06-06 NOTE — H&P (Signed)
I have seen and examined the patient. I agree with the findings above.  I discussed with the patient the risks and benefits of surgery for his right knee meniscal tear, including the possibility of infection, nerve injury, vessel injury, wound breakdown, arthritis, failure to eliminate all symptoms, DVT/ PE, loss of motion, heart attack, stroke, and need for further surgery among others.  He understood these risks and wished to proceed.  Budd Palmer, MD 06/06/2013 7:58 AM

## 2013-06-06 NOTE — Anesthesia Preprocedure Evaluation (Addendum)
Anesthesia Evaluation  Patient identified by MRN, date of birth, ID band Patient awake    Reviewed: Allergy & Precautions, H&P , NPO status , Patient's Chart, lab work & pertinent test results, reviewed documented beta blocker date and time   History of Anesthesia Complications Negative for: history of anesthetic complications  Airway Mallampati: II TM Distance: >3 FB Neck ROM: Full    Dental  (+) Caps and Dental Advisory Given   Pulmonary neg pulmonary ROS, Current Smoker,    Pulmonary exam normal       Cardiovascular hypertension, Pt. on home beta blockers + CAD and + Peripheral Vascular Disease  Echo 11/06/2011: Left ventricle: The cavity size was normal. Wall thickness was increased in a pattern of mild LVH. The estimated ejection fraction was 60%.   Neuro/Psych negative neurological ROS  negative psych ROS   GI/Hepatic Neg liver ROS, PUD, GERD-  Medicated,  Endo/Other  diabetes, Oral Hypoglycemic Agents  Renal/GU negative Renal ROS     Musculoskeletal   Abdominal   Peds  Hematology   Anesthesia Other Findings   Reproductive/Obstetrics                        Anesthesia Physical Anesthesia Plan  ASA: III  Anesthesia Plan: General   Post-op Pain Management:    Induction: Intravenous  Airway Management Planned: LMA  Additional Equipment:   Intra-op Plan:   Post-operative Plan: Extubation in OR  Informed Consent: I have reviewed the patients History and Physical, chart, labs and discussed the procedure including the risks, benefits and alternatives for the proposed anesthesia with the patient or authorized representative who has indicated his/her understanding and acceptance.   Dental advisory given  Plan Discussed with: CRNA, Anesthesiologist and Surgeon  Anesthesia Plan Comments:        Anesthesia Quick Evaluation

## 2013-06-06 NOTE — H&P (Signed)
[facility] H&P  Chief Complaint: R knee pain/ R knee medial meniscal tear HPI:   67 y/o male w/ long standing R knee pain exacerbated by issues w/ L leg. Pt has had frequent night pain in R knee as well as pain w/ forced extension of R knee. Pt was seen at our office where through w/u was performed including MRI which was + for R medial meniscal tear.  Pt has tried conservative measures and has failed to get relief. It is severely impacting his quality of life and wants to proceed with surgical intervention   Past Medical History  Diagnosis Date  . S/P CABG (coronary artery bypass graft) 2002    LIMA to LAD  . Tobacco abuse current    1/2 ppd smoker  . History of substance abuse     Alcohol, quit 1999.  Cocaine, quit late 1980's.  . Peptic ulcer disease   . Diabetes mellitus   . Stomach ulcer 1972    h/o antrectomy  . GERD (gastroesophageal reflux disease)   . Arthritis   . Carotid artery stenosis 10/2011    60-79% Right ICA stenosis  . High cholesterol   . Hypertension   . Coronary artery disease   . Seasonal allergies     Past Surgical History  Procedure Laterality Date  . Antrectomy  [id number]  . Cholecystectomy  06/2010  . Fracture surgery    . Femur fracture surgery  ~ 1964    left  . Coronary artery bypass graft  2002    CABG X1  . Coronary angioplasty with stent placement  11/06/11    2 DES to mid LCX and proximal RCA. 60% LAD stenosis after LIMA anastomosis    Family History  Problem Relation Age of Onset  . Stroke      grandparent  . Hypertension Mother     alive age 64 with pacemaker  . Coronary artery disease      grandparent  . Alcohol abuse Father     died in DWI accident when patient was a child   Social History:  reports that he has been smoking Cigarettes.  He has a 27.5 pack-year smoking history. He has never used smokeless tobacco. He reports that  drinks alcohol. He reports that he uses illicit drugs (Marijuana and  Cocaine).  Allergies: No Known Allergies  Medications Prior to Admission  Medication Sig Dispense Refill  . amLODipine (NORVASC) 5 MG tablet Take 5 mg by mouth daily.      Marland Kitchen aspirin EC 325 MG tablet Take 1 tablet (325 mg total) by mouth daily.  30 tablet  0  . carvedilol (COREG) 12.5 MG tablet Take 1 tablet (12.5 mg total) by mouth 2 (two) times daily.  30 tablet  11  . cholecalciferol (VITAMIN D) 1000 UNITS tablet Take 1,000 Units by mouth daily.      . fluticasone (FLONASE) 50 MCG/ACT nasal spray Place 2 sprays into the nose daily.      Marland Kitchen glipiZIDE (GLUCOTROL) 5 MG tablet Take 5 mg by mouth daily.      Marland Kitchen HYDROcodone-acetaminophen (NORCO/VICODIN) 5-325 MG per tablet Take 1 tablet by mouth every 6 (six) hours as needed for pain.      . hydroxypropyl methylcellulose (ISOPTO TEARS) 2.5 % ophthalmic solution Place 1 drop into both eyes 4 (four) times daily as needed.        . insulin detemir (LEVEMIR) 100 UNIT/ML injection Inject 40 Units into the skin 1 day or  1 dose. Before breakfast      . lisinopril (PRINIVIL,ZESTRIL) 40 MG tablet Take 40 mg by mouth daily.      Marland Kitchen loratadine (CLARITIN) 10 MG tablet Take 10 mg by mouth daily.      . metFORMIN (GLUCOPHAGE) 500 MG tablet Take 1,000 mg by mouth 2 (two) times daily with a meal.      . Multiple Vitamin (MULTIVITAMIN WITH MINERALS) TABS Take 1 tablet by mouth daily.      . Omega-3 Fatty Acids (FISH OIL) 1200 MG CAPS Take 1,200 mg by mouth daily.      . rosuvastatin (CRESTOR) 20 MG tablet Take 20 mg by mouth daily.      . nitroGLYCERIN (NITROSTAT) 0.4 MG SL tablet Place 1 tablet (0.4 mg total) under the tongue every 5 (five) minutes as needed for chest pain.  25 tablet  1    Results for orders placed during the hospital encounter of 06/06/13 (from the past 48 hour(s))  GLUCOSE, CAPILLARY     Status: None   Collection Time    06/06/13  7:20 AM      Result Value Range   Glucose-Capillary 95  70 - 99 mg/dL   Dg Chest 2 View  12/05/1094    *RADIOLOGY REPORT*  Clinical Data: Preoperative respiratory exam for right knee arthroscopic surgery.  CHEST - 2 VIEW  Comparison: 11/05/2011  Findings:  The chest shows clear lungs without evidence of infiltrate, edema or nodule.  No pleural fluid is identified.  The heart size is normal with evidence of prior CABG.  Mediastinal contours are within normal limits.  Stable old bilateral rib fractures are identified.  IMPRESSION: No active disease in the chest.   Original Report Authenticated By: Irish Lack, M.D.    Review of Systems  Constitutional: Negative for fever and chills.  Respiratory: Negative for shortness of breath and wheezing.   Cardiovascular: Negative for chest pain and palpitations.  Gastrointestinal: Negative for nausea, vomiting and abdominal pain.  Musculoskeletal: Positive for joint pain (R knee pain ).    Blood pressure 132/87, pulse 64, temperature 97.2 F (36.2 C), temperature source Oral, SpO2 96.00%. Physical Exam  Constitutional: He appears well-developed and well-nourished. He is cooperative.  Cardiovascular: S1 normal and S2 normal.   Respiratory: Effort normal. No respiratory distress.  Dec sounds at bases  Musculoskeletal:  R knee     Mild clinical varus    + McMurray's medially     + medial osteophyte    ROM 0-122   + TTP medial femoral condyle   + mild effusion   No varus or valgus instability   No cruciate laxity   Distal motor and sensory functions intact   Ext warm      Neurological: He is alert.     Assessment/Plan  67 y/o male with R medial meniscal tear and tricompartmental DJD R knee   OR for arthroscopic debridement of R knee outpt procedure WBAT after surgery ROM as tolerated after surgery as well   Mearl Latin, PA-C [facility] [phone] (P) 06/06/2013, 7:40 AM

## 2013-06-06 NOTE — Transfer of Care (Signed)
Immediate Anesthesia Transfer of Care Note  Patient: Aaron Santiago  Procedure(s) Performed: Procedure(s): RIGHT KNEE ARTHROSCOPY WITH PARTIAL MEDIAL MENISCECTOMY (Right)  Patient Location: PACU  Anesthesia Type:General  Level of Consciousness: awake, alert , oriented, patient cooperative and responds to stimulation  Airway & Oxygen Therapy: Patient Spontanous Breathing and Patient connected to nasal cannula oxygen  Post-op Assessment: Report given to PACU RN and Post -op Vital signs reviewed and stable  Post vital signs: Reviewed and stable  Complications: No apparent anesthesia complications

## 2013-06-06 NOTE — Preoperative (Signed)
Beta Blockers   Reason not to administer Beta Blockers:Not Applicable, took this AM 

## 2013-06-06 NOTE — Anesthesia Postprocedure Evaluation (Signed)
Anesthesia Post Note  Patient: Aaron Santiago  Procedure(s) Performed: Procedure(s) (LRB): RIGHT KNEE ARTHROSCOPY WITH PARTIAL MEDIAL MENISCECTOMY (Right)  Anesthesia type: general  Patient location: PACU  Post pain: Pain level controlled  Post assessment: Patient's Cardiovascular Status Stable  Last Vitals:  Filed Vitals:   06/06/13 1053  BP: 137/72  Pulse: 66  Temp: 36.1 C  Resp: 16    Post vital signs: Reviewed and stable  Level of consciousness: sedated  Complications: No apparent anesthesia complications

## 2013-06-06 NOTE — Brief Op Note (Signed)
06/06/2013  9:41 AM  PATIENT:  Aaron Santiago  67 y.o. male  PRE-OPERATIVE DIAGNOSIS:  RIGHT MEDIAL MENISCUS TEAR, chondromalacia of patellofemoral joint, medial compartment   POST-OPERATIVE DIAGNOSIS:  RIGHT MEDIAL MENISCUS TEAR, chondromalacia of patellofemoral joint, medial compartment   PROCEDURE:  Procedure(s): RIGHT KNEE ARTHROSCOPY WITH PARTIAL MEDIAL MENISCECTOMY (Right) Debridement and chondroplasty medial and patellofemoral compartments  SURGEON:  Surgeon(s) and Role:    * Budd Palmer, MD - Primary  PHYSICIAN ASSISTANT: None  ANESTHESIA:   general  EBL:  Total I/O In: 1000 [I.V.:1000] Out: -   BLOOD ADMINISTERED:none  DRAINS: none   LOCAL MEDICATIONS USED:  MARCAINE, morphine, methylprednisolone  SPECIMEN:  No Specimen  DISPOSITION OF SPECIMEN:  N/A  COUNTS:  YES  TOURNIQUET:    DICTATION: .Other Dictation: Dictation Number [id number]  PLAN OF CARE: Discharge to home after PACU  PATIENT DISPOSITION:  PACU - hemodynamically stable.   Delay start of Pharmacological VTE agent (>24hrs) due to surgical blood loss or risk of bleeding: no

## 2013-06-07 NOTE — Op Note (Signed)
[name], [name]                ACCOUNT NO.:  [id number]  MEDICAL RECORD NO.:  [id number]  LOCATION:  [facility]                         FACILITY:  [facility]  PHYSICIAN:  Doralee Albino. Carola Frost, M.D. DATE OF BIRTH:  12-04-1945  DATE OF PROCEDURE:  06/06/2013 DATE OF DISCHARGE:  06/06/2013                              OPERATIVE REPORT   PREOPERATIVE DIAGNOSES: 1. Right knee medial meniscus tear. 2. Right knee arthritis.  POSTOPERATIVE DIAGNOSES: 1. Severe medial meniscal tear, complex involving the entire posterior     horn and mid body. 2. Patellofemoral arthritis. 3. Medial compartment arthritis.  PROCEDURES: 1. Partial medial meniscectomy. 2. Debridement chondroplasty, patellofemoral and medial compartments,     including anterior edge of the lateral compartment.  SURGEON:  Doralee Albino. Carola Frost, M.D.  ASSISTANT:  None.  ANESTHESIA:  General.  COMPLICATIONS:  None.  TOURNIQUET:  None.  ANESTHESIA:  Local 0.5% Marcaine with epinephrine, 4 mg morphine, 40 mg of methylprednisolone.  DISPOSITION:  PACU.  CONDITION:  Stable.  BRIEF SUMMARY AND INDICATIONS FOR PROCEDURE:  Aaron Santiago is a 67 year old male, who has had progressive and persistent mechanical symptoms in the right knee, well localized to the medial side.  This was confirmed on physical examination and MRI.  I discussed with the patient the risks and benefits of surgical debridement including the possibility of failure to alleviate all symptoms, the progression of his arthritis, need for further surgery, DVT, PE, heart attack, stroke, and multiple others and the patient did wish to proceed.  BRIEF SUMMARY OF PROCEDURE:  Aaron Santiago was given 2 g of Ancef, taken to the operating room where general anesthesia was induced.  His right lower extremity was prepped and draped in the usual sterile fashion. Standard inferolateral and inferomedial portals were established in the knee, full diagnostic arthroscopy performed.  There  were some small chondral flakes that appeared to be floating throughout the knee, they were debrided.  Evaluation of the suprapatellar pouch did not reveal extensive synovitis.  As continued into the patellofemoral compartment, there was grade 2 and 3 changes of the inferior pole of the patella, some matching changes along the trochlea, which were grade 2 and 3, and then grade 3 changes involving the medial compartment both the patella or femoral sides.  There was a large extensive tear of the medial meniscus that extended all the way back to the rim at the mid body, debrided with a series of biters and then the arthroscopic shaver back to a stable rim, it could not displace into the joint.  It was clear they had articular damage on the patellar side where the lateral mid body tear had been caught in the joint and had accumulated damage there where there was an area of grade 4 medially underneath this tear.  All fronds were shaved off the femoral and tibial surfaces.  Similarly, this was done for the patella and trochlea, debrided a large areas of prominent scar in the anterior aspect of the knee.  Anything that was long enough it could pinch between the articular surfaces and this was ever fine with the arthroscopic wand in addition to the shaver.  The knee was then injected  with about 10 mL including 0.5% Marcaine with epi and 4 mg of morphine and 40 mg of steroid.  Sterile gently compressive dressing was applied.  The patient was awakened from anesthesia and transported to the PACU in stable condition.  PROGNOSIS:  Aaron Santiago will be weightbearing as tolerated.  Have ice elevation and remove his bandage and shower in 2 days.  We will plan to see him back in the office in 2 weeks after removal of sutures.     Doralee Albino. Carola Frost, M.D.     [facility]  D:  06/06/2013  T:  06/06/2013  Job:  [id number]

## 2013-06-08 ENCOUNTER — Encounter (HOSPITAL_COMMUNITY): Payer: Self-pay | Admitting: Orthopedic Surgery

## 2013-06-22 ENCOUNTER — Ambulatory Visit: Payer: Medicare Other | Attending: Orthopedic Surgery | Admitting: Physical Therapy

## 2013-06-22 DIAGNOSIS — IMO0001 Reserved for inherently not codable concepts without codable children: Secondary | ICD-10-CM | POA: Insufficient documentation

## 2013-06-22 DIAGNOSIS — M25569 Pain in unspecified knee: Secondary | ICD-10-CM | POA: Insufficient documentation

## 2013-06-22 DIAGNOSIS — R5381 Other malaise: Secondary | ICD-10-CM | POA: Insufficient documentation

## 2013-09-04 ENCOUNTER — Other Ambulatory Visit: Payer: Self-pay

## 2013-09-04 DIAGNOSIS — I2510 Atherosclerotic heart disease of native coronary artery without angina pectoris: Secondary | ICD-10-CM

## 2013-09-04 MED ORDER — CARVEDILOL 12.5 MG PO TABS: 12.5000 mg | ORAL_TABLET | Freq: Two times a day (BID) | ORAL | Status: DC

## 2013-10-11 ENCOUNTER — Ambulatory Visit (HOSPITAL_COMMUNITY): Payer: Medicare Other | Attending: Cardiology

## 2013-10-11 ENCOUNTER — Encounter: Payer: Self-pay | Admitting: Cardiology

## 2013-10-11 DIAGNOSIS — I10 Essential (primary) hypertension: Secondary | ICD-10-CM | POA: Insufficient documentation

## 2013-10-11 DIAGNOSIS — I2510 Atherosclerotic heart disease of native coronary artery without angina pectoris: Secondary | ICD-10-CM | POA: Insufficient documentation

## 2013-10-11 DIAGNOSIS — E785 Hyperlipidemia, unspecified: Secondary | ICD-10-CM | POA: Insufficient documentation

## 2013-10-11 DIAGNOSIS — Z951 Presence of aortocoronary bypass graft: Secondary | ICD-10-CM | POA: Insufficient documentation

## 2013-10-11 DIAGNOSIS — I6529 Occlusion and stenosis of unspecified carotid artery: Secondary | ICD-10-CM | POA: Insufficient documentation

## 2013-10-11 DIAGNOSIS — I658 Occlusion and stenosis of other precerebral arteries: Secondary | ICD-10-CM | POA: Insufficient documentation

## 2013-10-11 DIAGNOSIS — F17200 Nicotine dependence, unspecified, uncomplicated: Secondary | ICD-10-CM | POA: Insufficient documentation

## 2013-12-07 ENCOUNTER — Other Ambulatory Visit: Payer: Self-pay

## 2013-12-07 MED ORDER — AMLODIPINE BESYLATE 5 MG PO TABS: 5.0000 mg | ORAL_TABLET | Freq: Every day | ORAL | Status: DC

## 2013-12-11 ENCOUNTER — Other Ambulatory Visit: Payer: Self-pay | Admitting: *Deleted

## 2013-12-11 MED ORDER — LISINOPRIL 40 MG PO TABS: 40.0000 mg | ORAL_TABLET | Freq: Every day | ORAL | Status: DC

## 2013-12-26 ENCOUNTER — Telehealth: Payer: Self-pay

## 2013-12-26 ENCOUNTER — Telehealth: Payer: Self-pay | Admitting: Cardiovascular Disease

## 2013-12-26 NOTE — Telephone Encounter (Signed)
Received call from patient he stated he has been having chest tightness off and on for the past 2 days.No chest tightness at present.Also stated he saw PCP yesterday B/P elevated.Stated B/P this morning 150/90.Stated when he had chest tightness this morning B/P 175/100.Stated he is at work now no chest tightness.Appointment scheduled to see Norma Fredrickson NP 12/27/13 at 11:00 am.Advised to go to ER if needed.

## 2013-12-26 NOTE — Telephone Encounter (Signed)
Spoke to patient he stated he cancelled appointment with Norma Fredrickson NP for 12/27/13.Stated he went home and rechecked B/P 150/76.Stated he has not had any more chest tightness.Stated he will call back to reschedule if chest tightness returns.

## 2013-12-26 NOTE — Telephone Encounter (Signed)
New Problem:  Pt states his BP this morning was 175/100. Pt states yesterday his BP was 150/90... Pt is states this morning around 7:30 he was feeling tightness in his chest. Pt denies having tightness now.  Pt would like to speak to the nurse.

## 2013-12-27 ENCOUNTER — Ambulatory Visit: Payer: Medicare Other | Admitting: Nurse Practitioner

## 2014-04-10 ENCOUNTER — Encounter (INDEPENDENT_AMBULATORY_CARE_PROVIDER_SITE_OTHER): Payer: Self-pay

## 2014-04-10 ENCOUNTER — Encounter: Payer: Self-pay | Admitting: Cardiovascular Disease

## 2014-04-10 ENCOUNTER — Ambulatory Visit (INDEPENDENT_AMBULATORY_CARE_PROVIDER_SITE_OTHER): Payer: Medicare Other | Admitting: Cardiovascular Disease

## 2014-04-10 VITALS — BP 124/77 | HR 85 | Ht 72.0 in | Wt 161.2 lb

## 2014-04-10 DIAGNOSIS — I739 Peripheral vascular disease, unspecified: Secondary | ICD-10-CM

## 2014-04-10 DIAGNOSIS — I2510 Atherosclerotic heart disease of native coronary artery without angina pectoris: Secondary | ICD-10-CM

## 2014-04-10 DIAGNOSIS — F17200 Nicotine dependence, unspecified, uncomplicated: Secondary | ICD-10-CM

## 2014-04-10 DIAGNOSIS — Z720 Tobacco use: Secondary | ICD-10-CM

## 2014-04-10 DIAGNOSIS — E7800 Pure hypercholesterolemia, unspecified: Secondary | ICD-10-CM

## 2014-04-10 DIAGNOSIS — I6529 Occlusion and stenosis of unspecified carotid artery: Secondary | ICD-10-CM

## 2014-04-10 NOTE — Assessment & Plan Note (Signed)
He is overall stable with no symptoms suggestive of angina. Continue medical therapy. ECG is unchanged.

## 2014-04-10 NOTE — Assessment & Plan Note (Signed)
Most recent carotid Doppler showed 60-79% right ICA stenosis and progression to 40-59% left ICA stenosis. I requested a repeat carotid Doppler for evaluation. No previous history of CVA.

## 2014-04-10 NOTE — Assessment & Plan Note (Signed)
Continue treatment with rosuvastatin with a target LDL of less than 70. 

## 2014-04-10 NOTE — Patient Instructions (Signed)
Your physician has requested that you have a carotid duplex. This test is an ultrasound of the carotid arteries in your neck. It looks at blood flow through these arteries that supply the brain with blood. Allow one hour for this exam. There are no restrictions or special instructions.  Your physician recommends that you continue on your current medications as directed. Please refer to the Current Medication list given to you today.  Your physician wants you to follow-up in: 1 YEAR with Dr Kirke Corin.  You will receive a reminder letter in the mail two months in advance. If you don't receive a letter, please call our office to schedule the follow-up appointment.

## 2014-04-10 NOTE — Assessment & Plan Note (Signed)
I again had a prolonged discussion with him about the importance of smoking cessation. I therefore due to the smoking cessation class last year but he did not go. He informs me that he is not ready to quit.
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
## 2014-04-10 NOTE — Assessment & Plan Note (Signed)
He reports difficulty in optimal diabetes control and would like to be referred to endocrinology. I asked him to discuss with his primary care physician.

## 2014-04-24 ENCOUNTER — Ambulatory Visit (HOSPITAL_COMMUNITY): Payer: Medicare Other | Attending: Cardiology | Admitting: Cardiology

## 2014-04-24 ENCOUNTER — Encounter (HOSPITAL_COMMUNITY): Payer: Medicare Other

## 2014-04-24 DIAGNOSIS — I739 Peripheral vascular disease, unspecified: Secondary | ICD-10-CM | POA: Insufficient documentation

## 2014-04-24 DIAGNOSIS — I2510 Atherosclerotic heart disease of native coronary artery without angina pectoris: Secondary | ICD-10-CM | POA: Insufficient documentation

## 2014-04-24 DIAGNOSIS — I6529 Occlusion and stenosis of unspecified carotid artery: Secondary | ICD-10-CM | POA: Insufficient documentation

## 2014-04-24 NOTE — Progress Notes (Signed)
Carotid duplex complete 

## 2014-07-17 ENCOUNTER — Other Ambulatory Visit: Payer: Self-pay | Admitting: *Deleted

## 2014-07-17 MED ORDER — AMLODIPINE BESYLATE 5 MG PO TABS: 5.0000 mg | ORAL_TABLET | Freq: Every day | ORAL | Status: DC

## 2014-10-12 ENCOUNTER — Other Ambulatory Visit (HOSPITAL_COMMUNITY): Payer: Self-pay | Admitting: *Deleted

## 2014-10-12 DIAGNOSIS — I6523 Occlusion and stenosis of bilateral carotid arteries: Secondary | ICD-10-CM

## 2014-10-29 ENCOUNTER — Ambulatory Visit (HOSPITAL_COMMUNITY): Payer: Medicare Other | Attending: Family Medicine | Admitting: *Deleted

## 2014-10-29 DIAGNOSIS — I6529 Occlusion and stenosis of unspecified carotid artery: Secondary | ICD-10-CM | POA: Insufficient documentation

## 2014-10-29 DIAGNOSIS — Z951 Presence of aortocoronary bypass graft: Secondary | ICD-10-CM | POA: Diagnosis not present

## 2014-10-29 DIAGNOSIS — I2510 Atherosclerotic heart disease of native coronary artery without angina pectoris: Secondary | ICD-10-CM | POA: Insufficient documentation

## 2014-10-29 DIAGNOSIS — I6523 Occlusion and stenosis of bilateral carotid arteries: Secondary | ICD-10-CM

## 2014-10-29 DIAGNOSIS — I10 Essential (primary) hypertension: Secondary | ICD-10-CM | POA: Diagnosis not present

## 2014-10-29 DIAGNOSIS — Z720 Tobacco use: Secondary | ICD-10-CM | POA: Diagnosis not present

## 2014-10-29 DIAGNOSIS — E785 Hyperlipidemia, unspecified: Secondary | ICD-10-CM | POA: Insufficient documentation

## 2014-10-29 DIAGNOSIS — E119 Type 2 diabetes mellitus without complications: Secondary | ICD-10-CM | POA: Diagnosis not present

## 2014-10-29 NOTE — Progress Notes (Signed)
Carotid duplex completed 

## 2014-11-07 ENCOUNTER — Encounter (HOSPITAL_COMMUNITY): Payer: Self-pay | Admitting: Cardiovascular Disease

## 2014-12-07 ENCOUNTER — Other Ambulatory Visit: Payer: Self-pay

## 2014-12-07 MED ORDER — CARVEDILOL 12.5 MG PO TABS: 12.5000 mg | ORAL_TABLET | Freq: Two times a day (BID) | ORAL | Status: DC

## 2015-01-18 ENCOUNTER — Other Ambulatory Visit: Payer: Self-pay

## 2015-01-18 MED ORDER — AMLODIPINE BESYLATE 5 MG PO TABS: 5.0000 mg | ORAL_TABLET | Freq: Every day | ORAL | Status: DC

## 2015-01-21 ENCOUNTER — Other Ambulatory Visit: Payer: Self-pay

## 2015-02-19 ENCOUNTER — Telehealth: Payer: Self-pay | Admitting: Cardiovascular Disease

## 2015-02-19 NOTE — Telephone Encounter (Signed)
I will forward this message to Dr Kirke Corin to review and determine if he is okay with referring the pt to GI.

## 2015-02-19 NOTE — Telephone Encounter (Signed)
Patient stated he will follow up with his PCP  Instructed him to call back if he has any questions

## 2015-02-19 NOTE — Telephone Encounter (Signed)
New message      Pt want a referral to a GI doctor from Dr Kirke Corin.  He is having stomach problems and he is a diabetic.  He want someone here in [place].

## 2015-02-19 NOTE — Telephone Encounter (Signed)
Pt calling to see if we can call him back about below  States it is now effecting his work.  Please advise.

## 2015-02-19 NOTE — Telephone Encounter (Signed)
That is fine with me but it might be better if he sees his PCP for this first.

## 2015-03-11 ENCOUNTER — Other Ambulatory Visit: Payer: Self-pay | Admitting: Physician Assistant

## 2015-03-11 DIAGNOSIS — R1310 Dysphagia, unspecified: Secondary | ICD-10-CM

## 2015-03-12 ENCOUNTER — Ambulatory Visit
Admission: RE | Admit: 2015-03-12 | Discharge: 2015-03-12 | Disposition: A | Discharge status: Home / Self Care | Payer: Medicare Other | Source: Ambulatory Visit | Attending: Physician Assistant | Admitting: Physician Assistant

## 2015-03-12 DIAGNOSIS — R1310 Dysphagia, unspecified: Secondary | ICD-10-CM

## 2015-03-18 ENCOUNTER — Other Ambulatory Visit: Payer: Self-pay | Admitting: Gastroenterology

## 2015-03-19 ENCOUNTER — Other Ambulatory Visit (HOSPITAL_COMMUNITY): Payer: Self-pay | Admitting: Physician Assistant

## 2015-03-19 DIAGNOSIS — K3184 Gastroparesis: Secondary | ICD-10-CM

## 2015-03-19 DIAGNOSIS — R110 Nausea: Secondary | ICD-10-CM

## 2015-04-01 ENCOUNTER — Ambulatory Visit (HOSPITAL_COMMUNITY)
Admission: RE | Admit: 2015-04-01 | Discharge: 2015-04-01 | Disposition: A | Discharge status: Home / Self Care | Payer: Medicare Other | Source: Ambulatory Visit | Attending: Physician Assistant | Admitting: Physician Assistant

## 2015-04-01 DIAGNOSIS — R112 Nausea with vomiting, unspecified: Secondary | ICD-10-CM | POA: Diagnosis present

## 2015-04-01 DIAGNOSIS — R140 Abdominal distension (gaseous): Secondary | ICD-10-CM | POA: Insufficient documentation

## 2015-04-01 DIAGNOSIS — R109 Unspecified abdominal pain: Secondary | ICD-10-CM | POA: Insufficient documentation

## 2015-04-01 DIAGNOSIS — R110 Nausea: Secondary | ICD-10-CM

## 2015-04-01 DIAGNOSIS — E119 Type 2 diabetes mellitus without complications: Secondary | ICD-10-CM | POA: Diagnosis not present

## 2015-04-01 DIAGNOSIS — K3184 Gastroparesis: Secondary | ICD-10-CM

## 2015-04-01 MED ORDER — TECHNETIUM TC 99M SULFUR COLLOID
2.0000 | Freq: Once | INTRAVENOUS | Status: AC | PRN
  Administered 2015-04-01: 2 via INTRAVENOUS

## 2015-04-04 ENCOUNTER — Other Ambulatory Visit: Payer: Self-pay | Admitting: Cardiovascular Disease

## 2015-04-04 DIAGNOSIS — I6523 Occlusion and stenosis of bilateral carotid arteries: Secondary | ICD-10-CM

## 2015-04-30 ENCOUNTER — Ambulatory Visit (HOSPITAL_COMMUNITY): Payer: Medicare Other | Attending: Cardiovascular Disease

## 2015-04-30 ENCOUNTER — Encounter: Payer: Self-pay | Admitting: Cardiovascular Disease

## 2015-04-30 ENCOUNTER — Ambulatory Visit (INDEPENDENT_AMBULATORY_CARE_PROVIDER_SITE_OTHER): Payer: Medicare Other | Admitting: Cardiovascular Disease

## 2015-04-30 VITALS — BP 104/70 | HR 69 | Ht 72.0 in | Wt 151.0 lb

## 2015-04-30 DIAGNOSIS — I10 Essential (primary) hypertension: Secondary | ICD-10-CM | POA: Diagnosis not present

## 2015-04-30 DIAGNOSIS — Z720 Tobacco use: Secondary | ICD-10-CM | POA: Diagnosis not present

## 2015-04-30 DIAGNOSIS — I6523 Occlusion and stenosis of bilateral carotid arteries: Secondary | ICD-10-CM

## 2015-04-30 DIAGNOSIS — I2510 Atherosclerotic heart disease of native coronary artery without angina pectoris: Secondary | ICD-10-CM

## 2015-04-30 MED ORDER — LOSARTAN POTASSIUM 50 MG PO TABS: 50.0000 mg | ORAL_TABLET | Freq: Every day | ORAL | Status: DC

## 2015-04-30 NOTE — Assessment & Plan Note (Signed)
Carotid Doppler today showed a stable 60-79% right ICA stenosis and 40-59% left ICA stenosis. Continue aggressive medical therapy. Repeat carotid Doppler in 1 year.

## 2015-04-30 NOTE — Patient Instructions (Signed)
Medication Instructions:  Your physician has recommended you make the following change in your medication:  1. DECREASE Losartan to 50mg  take one by mouth daily  Labwork: No new orders.  Testing/Procedures: Your physician has requested that you have a carotid duplex in 1 YEAR. This test is an ultrasound of the carotid arteries in your neck. It looks at blood flow through these arteries that supply the brain with blood. Allow one hour for this exam. There are no restrictions or special instructions.  Follow-Up: Your physician wants you to follow-up in: 1 YEAR with Dr Kirke Corin.  You will receive a reminder letter in the mail two months in advance. If you don't receive a letter, please call our office to schedule the follow-up appointment.   Any Other Special Instructions Will Be Listed Below (If Applicable).
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
## 2015-04-30 NOTE — Assessment & Plan Note (Signed)
Blood pressure has been running low. Thus, I decreased the dose of losartan to 50 mg once daily.

## 2015-04-30 NOTE — Assessment & Plan Note (Signed)
He is doing very well with no symptoms suggestive of angina. I recommend continuing medical therapy. 

## 2015-04-30 NOTE — Assessment & Plan Note (Signed)
I discussed with him the importance of smoking cessation. 

## 2015-06-05 ENCOUNTER — Other Ambulatory Visit: Payer: Self-pay | Admitting: *Deleted

## 2015-06-05 MED ORDER — CARVEDILOL 12.5 MG PO TABS: 12.5000 mg | ORAL_TABLET | Freq: Two times a day (BID) | ORAL | Status: DC

## 2015-07-17 ENCOUNTER — Other Ambulatory Visit: Payer: Self-pay | Admitting: Cardiovascular Disease

## 2015-09-18 ENCOUNTER — Other Ambulatory Visit: Payer: Self-pay | Admitting: Physician Assistant

## 2015-09-18 DIAGNOSIS — R634 Abnormal weight loss: Secondary | ICD-10-CM

## 2015-09-18 DIAGNOSIS — R112 Nausea with vomiting, unspecified: Secondary | ICD-10-CM

## 2015-09-27 ENCOUNTER — Ambulatory Visit
Admission: RE | Admit: 2015-09-27 | Discharge: 2015-09-27 | Disposition: A | Discharge status: Home / Self Care | Payer: Medicare Other | Source: Ambulatory Visit | Attending: Physician Assistant | Admitting: Physician Assistant

## 2015-09-27 DIAGNOSIS — R112 Nausea with vomiting, unspecified: Secondary | ICD-10-CM

## 2015-09-27 DIAGNOSIS — R634 Abnormal weight loss: Secondary | ICD-10-CM

## 2015-09-27 MED ORDER — IOPAMIDOL (ISOVUE-300) INJECTION 61%
100.0000 mL | Freq: Once | INTRAVENOUS | Status: AC | PRN
  Administered 2015-09-27: 100 mL via INTRAVENOUS

## 2015-11-21 ENCOUNTER — Emergency Department (HOSPITAL_COMMUNITY)
Admission: EM | Admit: 2015-11-21 | Discharge: 2015-11-21 | Discharge status: Against Medical Advice | Payer: Medicare Other | Attending: Emergency Medicine | Admitting: Emergency Medicine

## 2015-11-21 ENCOUNTER — Encounter (HOSPITAL_COMMUNITY): Payer: Self-pay | Admitting: Emergency Medicine

## 2015-11-21 DIAGNOSIS — I10 Essential (primary) hypertension: Secondary | ICD-10-CM | POA: Insufficient documentation

## 2015-11-21 DIAGNOSIS — I2510 Atherosclerotic heart disease of native coronary artery without angina pectoris: Secondary | ICD-10-CM | POA: Insufficient documentation

## 2015-11-21 DIAGNOSIS — Z8711 Personal history of peptic ulcer disease: Secondary | ICD-10-CM | POA: Diagnosis not present

## 2015-11-21 DIAGNOSIS — Z7982 Long term (current) use of aspirin: Secondary | ICD-10-CM | POA: Insufficient documentation

## 2015-11-21 DIAGNOSIS — K219 Gastro-esophageal reflux disease without esophagitis: Secondary | ICD-10-CM | POA: Insufficient documentation

## 2015-11-21 DIAGNOSIS — Z9889 Other specified postprocedural states: Secondary | ICD-10-CM | POA: Diagnosis not present

## 2015-11-21 DIAGNOSIS — R21 Rash and other nonspecific skin eruption: Secondary | ICD-10-CM | POA: Diagnosis not present

## 2015-11-21 DIAGNOSIS — Z7984 Long term (current) use of oral hypoglycemic drugs: Secondary | ICD-10-CM | POA: Diagnosis not present

## 2015-11-21 DIAGNOSIS — F10129 Alcohol abuse with intoxication, unspecified: Secondary | ICD-10-CM | POA: Diagnosis present

## 2015-11-21 DIAGNOSIS — E7800 Pure hypercholesterolemia, unspecified: Secondary | ICD-10-CM | POA: Insufficient documentation

## 2015-11-21 DIAGNOSIS — M1990 Unspecified osteoarthritis, unspecified site: Secondary | ICD-10-CM | POA: Diagnosis not present

## 2015-11-21 DIAGNOSIS — R000 Tachycardia, unspecified: Secondary | ICD-10-CM | POA: Insufficient documentation

## 2015-11-21 DIAGNOSIS — Z9861 Coronary angioplasty status: Secondary | ICD-10-CM | POA: Diagnosis not present

## 2015-11-21 DIAGNOSIS — Z7951 Long term (current) use of inhaled steroids: Secondary | ICD-10-CM | POA: Insufficient documentation

## 2015-11-21 DIAGNOSIS — Z79899 Other long term (current) drug therapy: Secondary | ICD-10-CM | POA: Insufficient documentation

## 2015-11-21 DIAGNOSIS — F1910 Other psychoactive substance abuse, uncomplicated: Secondary | ICD-10-CM

## 2015-11-21 DIAGNOSIS — F10229 Alcohol dependence with intoxication, unspecified: Secondary | ICD-10-CM | POA: Diagnosis not present

## 2015-11-21 DIAGNOSIS — F17210 Nicotine dependence, cigarettes, uncomplicated: Secondary | ICD-10-CM | POA: Diagnosis not present

## 2015-11-21 DIAGNOSIS — Z794 Long term (current) use of insulin: Secondary | ICD-10-CM | POA: Insufficient documentation

## 2015-11-21 DIAGNOSIS — E119 Type 2 diabetes mellitus without complications: Secondary | ICD-10-CM | POA: Insufficient documentation

## 2015-11-21 DIAGNOSIS — G479 Sleep disorder, unspecified: Secondary | ICD-10-CM | POA: Diagnosis not present

## 2015-11-21 DIAGNOSIS — Z951 Presence of aortocoronary bypass graft: Secondary | ICD-10-CM | POA: Diagnosis not present

## 2015-11-21 DIAGNOSIS — F1110 Opioid abuse, uncomplicated: Secondary | ICD-10-CM | POA: Insufficient documentation

## 2015-11-21 LAB — LIPASE, BLOOD: LIPASE: 22 U/L (ref 11–51)

## 2015-11-21 LAB — URINALYSIS, ROUTINE W REFLEX MICROSCOPIC
Bilirubin Urine: NEGATIVE
GLUCOSE, UA: 500 mg/dL — AB
Hgb urine dipstick: NEGATIVE
Ketones, ur: NEGATIVE mg/dL
LEUKOCYTES UA: NEGATIVE
Nitrite: NEGATIVE
Protein, ur: 30 mg/dL — AB
Specific Gravity, Urine: 1.013 (ref 1.005–1.030)
pH: 6 (ref 5.0–8.0)

## 2015-11-21 LAB — COMPREHENSIVE METABOLIC PANEL
ALBUMIN: 3.3 g/dL — AB (ref 3.5–5.0)
ALT: 27 U/L (ref 17–63)
AST: 24 U/L (ref 15–41)
Alkaline Phosphatase: 108 U/L (ref 38–126)
Anion gap: 18 — ABNORMAL HIGH (ref 5–15)
BILIRUBIN TOTAL: 0.5 mg/dL (ref 0.3–1.2)
BUN: 9 mg/dL (ref 6–20)
CHLORIDE: 94 mmol/L — AB (ref 101–111)
CO2: 25 mmol/L (ref 22–32)
CREATININE: 0.8 mg/dL (ref 0.61–1.24)
Calcium: 8.6 mg/dL — ABNORMAL LOW (ref 8.9–10.3)
GFR calc Af Amer: >60 mL/min (ref >60)
GLUCOSE: 261 mg/dL — AB (ref 65–99)
Potassium: 3.4 mmol/L — ABNORMAL LOW (ref 3.5–5.1)
Sodium: 137 mmol/L (ref 135–145)
Total Protein: 6.4 g/dL — ABNORMAL LOW (ref 6.5–8.1)

## 2015-11-21 LAB — CBC WITH DIFFERENTIAL/PLATELET
Basophils Absolute: 0 10*3/uL (ref 0.0–0.1)
Basophils Relative: 0 %
EOS PCT: 0 %
Eosinophils Absolute: 0 10*3/uL (ref 0.0–0.7)
HCT: 45.1 % (ref 39.0–52.0)
Hemoglobin: 15.4 g/dL (ref 13.0–17.0)
LYMPHS ABS: 1.6 10*3/uL (ref 0.7–4.0)
LYMPHS PCT: 21 %
MCH: 27.7 pg (ref 26.0–34.0)
MCHC: 34.1 g/dL (ref 30.0–36.0)
MCV: 81.1 fL (ref 78.0–100.0)
MONO ABS: 0.6 10*3/uL (ref 0.1–1.0)
Monocytes Relative: 7 %
Neutro Abs: 5.4 10*3/uL (ref 1.7–7.7)
Neutrophils Relative %: 72 %
PLATELETS: 319 10*3/uL (ref 150–400)
RBC: 5.56 MIL/uL (ref 4.22–5.81)
RDW: 16.4 % — AB (ref 11.5–15.5)
WBC: 7.6 10*3/uL (ref 4.0–10.5)

## 2015-11-21 LAB — RAPID URINE DRUG SCREEN, HOSP PERFORMED
AMPHETAMINES: NOT DETECTED
Barbiturates: NOT DETECTED
Benzodiazepines: NOT DETECTED
Cocaine: NOT DETECTED
OPIATES: NOT DETECTED
Tetrahydrocannabinol: NOT DETECTED

## 2015-11-21 LAB — URINE MICROSCOPIC-ADD ON

## 2015-11-21 LAB — SALICYLATE LEVEL: Salicylate Lvl: <4 mg/dL (ref 2.8–30.0)

## 2015-11-21 LAB — POC OCCULT BLOOD, ED: Fecal Occult Bld: NEGATIVE

## 2015-11-21 LAB — CBG MONITORING, ED: Glucose-Capillary: 255 mg/dL — ABNORMAL HIGH (ref 65–99)

## 2015-11-21 LAB — ACETAMINOPHEN LEVEL: Acetaminophen (Tylenol), Serum: <10 ug/mL — ABNORMAL LOW (ref 10–30)

## 2015-11-21 LAB — ETHANOL: Alcohol, Ethyl (B): 343 mg/dL (ref <5)

## 2015-11-21 MED ORDER — LORAZEPAM 2 MG/ML IJ SOLN
2.0000 mg | Freq: Once | INTRAMUSCULAR | Status: AC
  Administered 2015-11-21: 2 mg via INTRAVENOUS
  Filled 2015-11-21: qty 1

## 2015-11-21 MED ORDER — SODIUM CHLORIDE 0.9 % IV SOLN
Freq: Once | INTRAVENOUS | Status: AC
Start: 2015-11-21 — End: 2015-11-21
  Administered 2015-11-21: 13:00:00 via INTRAVENOUS

## 2015-11-21 MED ORDER — LORAZEPAM 1 MG PO TABS: 0.0000 mg | ORAL_TABLET | Freq: Two times a day (BID) | ORAL | Status: DC

## 2015-11-21 MED ORDER — VITAMIN B-1 100 MG PO TABS: 100.0000 mg | ORAL_TABLET | Freq: Every day | ORAL | Status: DC

## 2015-11-21 MED ORDER — SODIUM CHLORIDE 0.9 % IV BOLUS (SEPSIS)
1000.0000 mL | Freq: Once | INTRAVENOUS | Status: AC
  Administered 2015-11-21: 1000 mL via INTRAVENOUS

## 2015-11-21 MED ORDER — NYSTATIN 100000 UNIT/GM EX CREA
TOPICAL_CREAM | Freq: Two times a day (BID) | CUTANEOUS | Status: DC
  Filled 2015-11-21: qty 15

## 2015-11-21 MED ORDER — LORAZEPAM 1 MG PO TABS: 0.0000 mg | ORAL_TABLET | Freq: Four times a day (QID) | ORAL | Status: DC

## 2015-11-21 MED ORDER — LORAZEPAM 2 MG/ML IJ SOLN
1.0000 mg | Freq: Once | INTRAMUSCULAR | Status: AC
  Administered 2015-11-21: 1 mg via INTRAVENOUS
  Filled 2015-11-21: qty 1

## 2015-11-21 MED ORDER — THIAMINE HCL 100 MG/ML IJ SOLN
100.0000 mg | Freq: Every day | INTRAMUSCULAR | Status: DC
  Administered 2015-11-21: 100 mg via INTRAVENOUS
  Filled 2015-11-21: qty 2

## 2015-11-21 MED ORDER — CARVEDILOL 12.5 MG PO TABS
12.5000 mg | ORAL_TABLET | Freq: Two times a day (BID) | ORAL | Status: DC
  Administered 2015-11-21: 12.5 mg via ORAL
  Filled 2015-11-21 (×2): qty 1

## 2015-11-21 MED ORDER — LORAZEPAM 2 MG/ML IJ SOLN: 0.0000 mg | Freq: Four times a day (QID) | INTRAMUSCULAR | Status: DC

## 2015-11-21 MED ORDER — LORAZEPAM 2 MG/ML IJ SOLN: 0.0000 mg | Freq: Two times a day (BID) | INTRAMUSCULAR | Status: DC
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
## 2015-11-21 NOTE — Progress Notes (Signed)
Patient to be referred to a gero-psych unit. Patient 69 years old, requires assistance with ADLs and wears a diaper, per TTS counselor. Rosey Bath, RN

## 2015-11-21 NOTE — ED Notes (Signed)
Patient here from home with complaints of etoh. Sister reports that he has taken 120 hydrocodone within the past 3 days. Also complains of dark stools and abdominal pain.
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
## 2015-11-21 NOTE — Progress Notes (Signed)
Cm sent pcp an in basket message per pt permission about pt [facility] ed visit, concern with taking hydrocodone 120 tabs in 17 days plus may need pcp assist with pcs or pdn services (pt to have medicaid services starting 12/01/15

## 2015-11-21 NOTE — ED Provider Notes (Signed)
Pt left ama after speaking with TTS.  Pt left before I could speak with him  Bethann Berkshire, MD 11/21/15 2011

## 2015-11-21 NOTE — ED Notes (Signed)
[facility] non emergency dept. Communication called and requested assistant to check on pt. On his resident., pt. Had  IV on his left arm.

## 2015-11-21 NOTE — ED Notes (Signed)
Came back to pt.s room and found pt. Left AMA. MD aware. Charge Nurse also notified.

## 2015-11-21 NOTE — ED Notes (Signed)
Sitter here reporting that patient did not consume 120 tablets of hydrocodone. Reports that patient has been out of hydrocodone over the past 3 days.

## 2015-11-21 NOTE — ED Notes (Signed)
Bed: WA06 Expected date:  Expected time:  Means of arrival:  Comments: 69 y/o M etoh, GI bleed

## 2015-11-21 NOTE — ED Notes (Signed)
IV TEAM HAS TO BE CALLED TO COLLECT SAMPLE

## 2015-11-21 NOTE — Progress Notes (Signed)
Pt meets inpatient criteria, per NP Josephine.  Patient has been referred for the geriatric psychiatric treatment at: [facility] - per [facility], by midnight will have beds for everybody, referral faxed. [facility] - per [employer], fax referral.  [facility] - per Fre[facility] Busman, will look at referrals. [facility] - per intake, fax referral for the waitlist. [facility] - per Britt Boozer, has beds for everyone, fax it. [facility] - per Romilda Joy, will look at referrals. [facility] - per Burna Mortimer, fax referral. [facility] - per Delorise Shiner, fax referral for review. Writer waiting for the chest xray so that referral can be completed. RN informed.  At capacity: [facility] - per Dola Argyle, [place] Disposition staff 11/21/2015 6:22 PM

## 2015-11-21 NOTE — ED Notes (Signed)
Patient aware urine sample is needed. Urinal is at the bedside.  

## 2015-11-21 NOTE — ED Notes (Signed)
Received a call from [facility] , with recommendation pt. For Chest Xray , awaiting for placement in [place]. MD notified. Pt. Made aware of the plan, pt. And sister refused to stay and or be transferred to another facility. Requested to be discharged. To notify MD.

## 2015-11-24 ENCOUNTER — Emergency Department (HOSPITAL_COMMUNITY)
Admission: EM | Admit: 2015-11-24 | Discharge: 2015-11-25 | Disposition: A | Discharge status: Other Acute Inpatient Hospital | Payer: Medicare Other | Attending: Emergency Medicine | Admitting: Emergency Medicine

## 2015-11-24 ENCOUNTER — Encounter (HOSPITAL_COMMUNITY): Payer: Self-pay | Admitting: Emergency Medicine

## 2015-11-24 DIAGNOSIS — F17210 Nicotine dependence, cigarettes, uncomplicated: Secondary | ICD-10-CM | POA: Diagnosis not present

## 2015-11-24 DIAGNOSIS — F1994 Other psychoactive substance use, unspecified with psychoactive substance-induced mood disorder: Secondary | ICD-10-CM

## 2015-11-24 DIAGNOSIS — E119 Type 2 diabetes mellitus without complications: Secondary | ICD-10-CM | POA: Insufficient documentation

## 2015-11-24 DIAGNOSIS — I10 Essential (primary) hypertension: Secondary | ICD-10-CM | POA: Diagnosis not present

## 2015-11-24 DIAGNOSIS — I2510 Atherosclerotic heart disease of native coronary artery without angina pectoris: Secondary | ICD-10-CM | POA: Insufficient documentation

## 2015-11-24 DIAGNOSIS — Z79899 Other long term (current) drug therapy: Secondary | ICD-10-CM | POA: Insufficient documentation

## 2015-11-24 DIAGNOSIS — Z951 Presence of aortocoronary bypass graft: Secondary | ICD-10-CM | POA: Insufficient documentation

## 2015-11-24 DIAGNOSIS — F10120 Alcohol abuse with intoxication, uncomplicated: Secondary | ICD-10-CM | POA: Insufficient documentation

## 2015-11-24 DIAGNOSIS — Z7982 Long term (current) use of aspirin: Secondary | ICD-10-CM | POA: Diagnosis not present

## 2015-11-24 DIAGNOSIS — F10929 Alcohol use, unspecified with intoxication, unspecified: Secondary | ICD-10-CM | POA: Diagnosis present

## 2015-11-24 DIAGNOSIS — M1990 Unspecified osteoarthritis, unspecified site: Secondary | ICD-10-CM | POA: Insufficient documentation

## 2015-11-24 DIAGNOSIS — K219 Gastro-esophageal reflux disease without esophagitis: Secondary | ICD-10-CM | POA: Diagnosis not present

## 2015-11-24 DIAGNOSIS — Z7984 Long term (current) use of oral hypoglycemic drugs: Secondary | ICD-10-CM | POA: Diagnosis not present

## 2015-11-24 DIAGNOSIS — Z8711 Personal history of peptic ulcer disease: Secondary | ICD-10-CM | POA: Diagnosis not present

## 2015-11-24 DIAGNOSIS — F10920 Alcohol use, unspecified with intoxication, uncomplicated: Secondary | ICD-10-CM

## 2015-11-24 LAB — RAPID URINE DRUG SCREEN, HOSP PERFORMED
Amphetamines: NOT DETECTED
BARBITURATES: NOT DETECTED
Benzodiazepines: NOT DETECTED
Cocaine: NOT DETECTED
Opiates: NOT DETECTED
Tetrahydrocannabinol: NOT DETECTED

## 2015-11-24 LAB — COMPREHENSIVE METABOLIC PANEL
ALK PHOS: 86 U/L (ref 38–126)
ALT: 22 U/L (ref 17–63)
AST: 18 U/L (ref 15–41)
Albumin: 2.9 g/dL — ABNORMAL LOW (ref 3.5–5.0)
Anion gap: 13 (ref 5–15)
BILIRUBIN TOTAL: 0.3 mg/dL (ref 0.3–1.2)
BUN: 9 mg/dL (ref 6–20)
CALCIUM: 8.1 mg/dL — AB (ref 8.9–10.3)
CO2: 26 mmol/L (ref 22–32)
CREATININE: 0.74 mg/dL (ref 0.61–1.24)
Chloride: 96 mmol/L — ABNORMAL LOW (ref 101–111)
Glucose, Bld: 247 mg/dL — ABNORMAL HIGH (ref 65–99)
Potassium: 3 mmol/L — ABNORMAL LOW (ref 3.5–5.1)
Sodium: 135 mmol/L (ref 135–145)
TOTAL PROTEIN: 6.1 g/dL — AB (ref 6.5–8.1)

## 2015-11-24 LAB — URINALYSIS, ROUTINE W REFLEX MICROSCOPIC
BILIRUBIN URINE: NEGATIVE
Glucose, UA: 250 mg/dL — AB
HGB URINE DIPSTICK: NEGATIVE
KETONES UR: NEGATIVE mg/dL
Leukocytes, UA: NEGATIVE
NITRITE: NEGATIVE
Protein, ur: NEGATIVE mg/dL
Specific Gravity, Urine: 1.01 (ref 1.005–1.030)
pH: 5.5 (ref 5.0–8.0)

## 2015-11-24 LAB — CBC WITH DIFFERENTIAL/PLATELET
Basophils Absolute: 0 10*3/uL (ref 0.0–0.1)
Basophils Relative: 1 %
EOS PCT: 1 %
Eosinophils Absolute: 0 10*3/uL (ref 0.0–0.7)
HEMATOCRIT: 40.8 % (ref 39.0–52.0)
Hemoglobin: 14.1 g/dL (ref 13.0–17.0)
LYMPHS ABS: 1.5 10*3/uL (ref 0.7–4.0)
LYMPHS PCT: 27 %
MCH: 28.2 pg (ref 26.0–34.0)
MCHC: 34.6 g/dL (ref 30.0–36.0)
MCV: 81.6 fL (ref 78.0–100.0)
Monocytes Absolute: 0.2 10*3/uL (ref 0.1–1.0)
Monocytes Relative: 4 %
NEUTROS ABS: 3.8 10*3/uL (ref 1.7–7.7)
Neutrophils Relative %: 69 %
PLATELETS: 297 10*3/uL (ref 150–400)
RBC: 5 MIL/uL (ref 4.22–5.81)
RDW: 15.9 % — ABNORMAL HIGH (ref 11.5–15.5)
WBC: 5.5 10*3/uL (ref 4.0–10.5)

## 2015-11-24 LAB — CBG MONITORING, ED: GLUCOSE-CAPILLARY: 233 mg/dL — AB (ref 65–99)

## 2015-11-24 LAB — ETHANOL
ALCOHOL ETHYL (B): 420 mg/dL — AB (ref <5)
ALCOHOL ETHYL (B): 189 mg/dL — AB (ref <5)

## 2015-11-24 LAB — SALICYLATE LEVEL

## 2015-11-24 LAB — ACETAMINOPHEN LEVEL

## 2015-11-24 MED ORDER — LORAZEPAM 1 MG PO TABS: 0.0000 mg | ORAL_TABLET | Freq: Two times a day (BID) | ORAL | Status: DC

## 2015-11-24 MED ORDER — SUCRALFATE 1 GM/10ML PO SUSP
1.0000 g | Freq: Four times a day (QID) | ORAL | Status: DC
  Administered 2015-11-24 – 2015-11-25 (×2): 1 g via ORAL
  Filled 2015-11-24 (×2): qty 10

## 2015-11-24 MED ORDER — ONDANSETRON HCL 4 MG PO TABS: 4.0000 mg | ORAL_TABLET | Freq: Three times a day (TID) | ORAL | Status: DC | PRN

## 2015-11-24 MED ORDER — INSULIN DETEMIR 100 UNIT/ML ~~LOC~~ SOLN: 40.0000 [IU] | SUBCUTANEOUS | Status: DC

## 2015-11-24 MED ORDER — VITAMIN B-1 100 MG PO TABS
100.0000 mg | ORAL_TABLET | Freq: Every day | ORAL | Status: DC
  Administered 2015-11-25: 100 mg via ORAL
  Filled 2015-11-24: qty 1

## 2015-11-24 MED ORDER — METFORMIN HCL 500 MG PO TABS
1000.0000 mg | ORAL_TABLET | Freq: Two times a day (BID) | ORAL | Status: DC
  Administered 2015-11-25: 1000 mg via ORAL
  Filled 2015-11-24: qty 2

## 2015-11-24 MED ORDER — METOCLOPRAMIDE HCL 10 MG PO TABS
5.0000 mg | ORAL_TABLET | Freq: Three times a day (TID) | ORAL | Status: DC
  Administered 2015-11-25 (×2): 5 mg via ORAL
  Filled 2015-11-24 (×2): qty 1

## 2015-11-24 MED ORDER — AMLODIPINE BESYLATE 5 MG PO TABS
5.0000 mg | ORAL_TABLET | Freq: Every day | ORAL | Status: DC
  Administered 2015-11-25: 5 mg via ORAL
  Filled 2015-11-24: qty 1

## 2015-11-24 MED ORDER — INSULIN DETEMIR 100 UNIT/ML ~~LOC~~ SOLN
40.0000 [IU] | SUBCUTANEOUS | Status: DC
  Administered 2015-11-25: 40 [IU] via SUBCUTANEOUS
  Filled 2015-11-24 (×3): qty 0.4

## 2015-11-24 MED ORDER — CARVEDILOL 12.5 MG PO TABS
12.5000 mg | ORAL_TABLET | Freq: Two times a day (BID) | ORAL | Status: DC
  Administered 2015-11-24 – 2015-11-25 (×3): 12.5 mg via ORAL
  Filled 2015-11-24 (×2): qty 1

## 2015-11-24 MED ORDER — THIAMINE HCL 100 MG/ML IJ SOLN: 100.0000 mg | Freq: Every day | INTRAMUSCULAR | Status: DC

## 2015-11-24 MED ORDER — PANTOPRAZOLE SODIUM 40 MG PO TBEC
40.0000 mg | DELAYED_RELEASE_TABLET | Freq: Every day | ORAL | Status: DC
  Administered 2015-11-25: 40 mg via ORAL
  Filled 2015-11-24: qty 1

## 2015-11-24 MED ORDER — ASPIRIN EC 81 MG PO TBEC
81.0000 mg | DELAYED_RELEASE_TABLET | Freq: Every day | ORAL | Status: DC
  Administered 2015-11-25: 81 mg via ORAL
  Filled 2015-11-24: qty 1

## 2015-11-24 MED ORDER — LORAZEPAM 1 MG PO TABS
0.0000 mg | ORAL_TABLET | Freq: Four times a day (QID) | ORAL | Status: DC
  Administered 2015-11-25 (×2): 1 mg via ORAL
  Filled 2015-11-24 (×2): qty 1

## 2015-11-24 MED ORDER — NAPROXEN 250 MG PO TABS: 500.0000 mg | ORAL_TABLET | Freq: Two times a day (BID) | ORAL | Status: DC | PRN

## 2015-11-24 MED ORDER — LORAZEPAM 2 MG/ML IJ SOLN: 0.0000 mg | Freq: Four times a day (QID) | INTRAMUSCULAR | Status: DC

## 2015-11-24 MED ORDER — LORAZEPAM 2 MG/ML IJ SOLN: 0.0000 mg | Freq: Two times a day (BID) | INTRAMUSCULAR | Status: DC

## 2015-11-24 MED ORDER — LOSARTAN POTASSIUM 50 MG PO TABS
50.0000 mg | ORAL_TABLET | Freq: Every day | ORAL | Status: DC
  Administered 2015-11-25: 50 mg via ORAL
  Filled 2015-11-24 (×4): qty 1

## 2015-11-24 MED ORDER — METOPROLOL TARTRATE 50 MG PO TABS
50.0000 mg | ORAL_TABLET | Freq: Two times a day (BID) | ORAL | Status: DC
  Administered 2015-11-24 – 2015-11-25 (×2): 50 mg via ORAL
  Filled 2015-11-24 (×2): qty 1

## 2015-11-24 MED ORDER — ROSUVASTATIN CALCIUM 10 MG PO TABS
10.0000 mg | ORAL_TABLET | Freq: Every day | ORAL | Status: DC
  Administered 2015-11-25: 10 mg via ORAL
  Filled 2015-11-24 (×4): qty 1

## 2015-11-24 NOTE — ED Notes (Signed)
CRITICAL VALUE ALERT  Critical value received:  ETOH 420  Date of notification:  11/24/15  Time of notification:  1341  Critical value read back:Yes.    Nurse who received alert:  Berdine Dance RN  MD notified (1st page):  Fayrene Fearing  Time of first page:  1341  MD notified (2nd page):  Time of second page:  Responding MD:  Fayrene Fearing  Time MD responded:  1341

## 2015-11-24 NOTE — ED Notes (Addendum)
EDP aware that bladder scan revealed 37ml at this time. No new orders given.   Pt family aware and given a copy of visitor policy and updated on care plan.   Pt resting. nad noted. Sitter at bedside.

## 2015-11-24 NOTE — ED Notes (Addendum)
telepsych in progress.pt more alert at this time, resting intermittently.

## 2015-11-24 NOTE — ED Notes (Signed)
Patient resting in bed, eyes closed, even rise and fall of chest. NAD noted at this time.

## 2015-11-24 NOTE — ED Notes (Signed)
telepsych monitor at bedside. 
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
## 2015-11-24 NOTE — ED Notes (Addendum)
Per EDP pt admits feelings of depression but denies SI/HI. EDP reports pt reports I don't wont to live anymore. EDP at bedside talking with family at this time.

## 2015-11-24 NOTE — ED Notes (Signed)
Lab at bedside but reported would return once telepsych completed.

## 2015-11-24 NOTE — ED Notes (Signed)
Lab at bedside

## 2015-11-24 NOTE — ED Notes (Signed)
Pt resting, o2 saturation decreased to 88%. Pt placed on 2 liters. Pt 02 saturation >92%. nad noted.

## 2015-11-24 NOTE — ED Notes (Signed)
Talked with patient family regarding if patient took daily medication this am. Pt family denies knowledge of patient taking medications lately. Pt to drowsy and inebriated to take daily medication at this time. Charge RN aware.

## 2015-11-24 NOTE — ED Notes (Signed)
Per EDP, Bladder Scan patient intermittently. If greater than 400 foley will need to be inserted. If urine amount in bladder <400, give patient urinal and ask to void.

## 2015-11-24 NOTE — BH Assessment (Addendum)
Tele Assessment Note  **Pt's labs haven't resulted at time of assessment** Aaron Santiago is an 69 y.o. male. Pt presents voluntarily to [facility] BIB sister, Aaron Santiago, who is at beside. Pt is poor historian as he often falls asleep and doesn't answer questions asked. He is oriented to person only. Writer asks pt twice what is today's date including year and pt twice replies that this year is 67. Pt denies SI and HI. Per chart review, pt presented to [facility] for similar complaint but he left AMA. Sister provides collateral info. She says pt has been drinking heavily for 7 days straight. Sister reports pt drank approx 1.5 gallons vodka today. She says pt told her a few days ago that "I don't want to live anymore". She denies pt has a hx of suicide attempts or a hx of self harm. She says pt has been inpatient in psych facilities at least 7 times for alcohol abuse. She sts pt's last admission was at [facility] approx 6 yrs ago. She reports that he abuses hydrocodone and recently his MD refused to give him anymore hydrocodone. Sister says that pt began drinking 7 days ago "to take the edge off". When asked what "edge" meant, sister says pt drinks so he won't have withdrawal sxs. She denies hx of seizures for pt.   Sister says pt is able to complete all his ADLs. She says she lives beside him and checks on him daily. Sister reports an RN calls pt weekly to check in but doesn't come to pt's house. Sister requests that pt stay in "safe place" until pt can be admitted to [facility] for SA treatment. Sister reports she is working with [facility] to try to get him into a treatment center. Writer explains that [facility] can't serve as a waiting area for a pt to get into a treatment center. Writer ran pt by May Agustin NP who recommends pt be kept overnight with psych evaluation am 12/26.   Diagnosis:  Alcohol Use Disorder, Severe  Past Medical History:  Past Medical History  Diagnosis Date  . S/P CABG (coronary artery bypass  graft) 2002    LIMA to LAD  . Tobacco abuse current    1/2 ppd smoker  . History of substance abuse     Alcohol, quit 1999.  Cocaine, quit late 1980's.  . Peptic ulcer disease   . Diabetes mellitus   . Stomach ulcer 1972    h/o antrectomy  . GERD (gastroesophageal reflux disease)   . Arthritis   . Carotid artery stenosis 10/2011    60-79% Right ICA stenosis  . High cholesterol   . Hypertension   . Coronary artery disease   . Seasonal allergies     Past Surgical History  Procedure Laterality Date  . Antrectomy  [id number]  . Cholecystectomy  06/2010  . Fracture surgery    . Femur fracture surgery  ~ 1964    left  . Coronary artery bypass graft  2002    CABG X1  . Coronary angioplasty with stent placement  11/06/11    2 DES to mid LCX and proximal RCA. 60% LAD stenosis after LIMA anastomosis  . Knee arthroscopy with medial menisectomy Right 06/06/2013    Procedure: RIGHT KNEE ARTHROSCOPY WITH PARTIAL MEDIAL MENISCECTOMY;  Surgeon: Budd Palmer, MD;  Location: [facility] OR;  Service: Orthopedics;  Laterality: Right;  . Left heart catheterization with coronary angiogram N/A 11/06/2011    Procedure: LEFT HEART CATHETERIZATION WITH CORONARY ANGIOGRAM;  Surgeon: Iran Ouch, MD;  Location: [facility] CATH LAB;  Service: Cardiovascular;  Laterality: N/A;  . Percutaneous coronary stent intervention (pci-s) N/A 11/06/2011    Procedure: PERCUTANEOUS CORONARY STENT INTERVENTION (PCI-S);  Surgeon: Iran Ouch, MD;  Location: [facility] CATH LAB;  Service: Cardiovascular;  Laterality: N/A;    Family History:  Family History  Problem Relation Age of Onset  . Stroke      grandparent  . Hypertension Mother     alive age 52 with pacemaker  . Coronary artery disease      grandparent  . Alcohol abuse Father     died in DWI accident when patient was a child    Social History:  reports that he has been smoking Cigarettes.  He has a 27.5 pack-year smoking history. He has never used smokeless tobacco. He  reports that he drinks alcohol. He reports that he uses illicit drugs (Hydrocodone, Marijuana, and Cocaine).  Additional Social History:  Alcohol / Drug Use Pain Medications: sister reports pt abuses hydrocodone Prescriptions: see PTA meds list - per sister, pt doesn't abuse  Over the Counter: sister reports pt doesn't abuse other drugs History of alcohol / drug use?: Yes Longest period of sobriety (when/how long): 6 Negative Consequences of Use: Financial, Armed forces operational officer, Personal relationships, Work / School Substance #1 Name of Substance 1: alcohol 1 - Age of First Use: 13 1 - Amount (size/oz): unknown 1 - Frequency: daily 1 - Duration: for past 7 days 1 - Last Use / Amount: 11/24/15 - 1.5 gallons vodka  CIWA: CIWA-Ar BP: 151/86 mmHg Pulse Rate: 91 COWS:    PATIENT STRENGTHS: (choose at least two) Average or above average intelligence Capable of independent living Supportive family/friends  Allergies: No Known Allergies  Home Medications:  (Not in a hospital admission)  OB/GYN Status:  No LMP for male patient.  General Assessment Data Location of Assessment: [facility] TTS Assessment: In system Is this a Tele or Face-to-Face Assessment?: Tele Assessment Is this an Initial Assessment or a Re-assessment for this encounter?: Initial Assessment Marital status: Single Living Arrangements: Alone Can pt return to current living arrangement?: Yes Admission Status: Voluntary Is patient capable of signing voluntary admission?: No Referral Source: Self/Family/Friend Insurance type: medicare     Crisis Care Plan Living Arrangements: Alone Name of Psychiatrist: none Name of Therapist: none  Education Status Is patient currently in school?: No Highest grade of school patient has completed: technical school  Risk to self with the past 6 months Suicidal Ideation: No Has patient been a risk to self within the past 6 months prior to admission? : No Suicidal Intent: No Has patient had  any suicidal intent within the past 6 months prior to admission? : No Is patient at risk for suicide?: No Suicidal Plan?: No Has patient had any suicidal plan within the past 6 months prior to admission? : No Access to Means:  (n/a) What has been your use of drugs/alcohol within the last 12 months?: alcohol for past 7 days, hydrocodone abuse prior to that Previous Attempts/Gestures: No How many times?: 0 Other Self Harm Risks: none Triggers for Past Attempts:  (n/a) Intentional Self Injurious Behavior: None Family Suicide History: Yes (paternal died by drinking himself to death) Recent stressful life event(s):  (unable to assess) Persecutory voices/beliefs?:  (unable to assess) Depression:  (unable to assess) Depression Symptoms:  (unable to assess) Substance abuse history and/or treatment for substance abuse?: Yes Suicide prevention information given to non-admitted patients: Not applicable  Risk to Others within the past 6 months Homicidal Ideation: No Does patient have any lifetime risk of violence toward others beyond the six months prior to admission? : No Thoughts of Harm to Others: No Current Homicidal Intent: No Current Homicidal Plan: No Access to Homicidal Means: No Identified Victim: none History of harm to others?: No (sister denies ) Assessment of Violence: None Noted Violent Behavior Description: none Does patient have access to weapons?: No Criminal Charges Pending?: No Does patient have a court date: No Is patient on probation?: No  Psychosis Hallucinations:  (unable to assess) Delusions:  (unable to assess)  Mental Status Report Appearance/Hygiene: Disheveled, In scrubs Eye Contact: Poor (eyes closed most of time) Motor Activity: Other (Comment) (pt drowsy and sleeps intermittently) Speech: Slurred, Loud Level of Consciousness: Drowsy, Sleeping Mood:  (unable to assess) Affect: Other (Comment) (drowsy, ) Anxiety Level:  (unable to assess) Thought  Processes: Unable to Assess Judgement: Impaired Orientation: Person (pt thinks it is 1949) Obsessive Compulsive Thoughts/Behaviors: Unable to Assess  Cognitive Functioning Concentration: Unable to Assess Memory: Unable to Assess IQ: Average Insight: Unable to Assess Impulse Control: Unable to Assess Appetite:  (unable to assess) Sleep: Decreased Total Hours of Sleep:  (unable to assess) Vegetative Symptoms: None  ADLScreening [facility] Assessment Services) Patient's cognitive ability adequate to safely complete daily activities?: Yes Patient able to express need for assistance with ADLs?: Yes Independently performs ADLs?: Yes (appropriate for developmental age)  Prior Inpatient Therapy Prior Inpatient Therapy: Yes Prior Therapy Dates: from 8 until 6 yrs ago Prior Therapy Facilty/Provider(s): [facility] and other unnamed facilities Reason for Treatment: substance abuse - alcoholism and opioid abuse  Prior Outpatient Therapy Prior Outpatient Therapy: No Prior Therapy Dates: na Prior Therapy Facilty/Provider(s): na Reason for Treatment: na Does patient have an ACCT team?: No Does patient have Intensive In-House Services?  : No Does patient have [facility] services? : No Does patient have P4CC services?: No  ADL Screening (condition at time of admission) Patient's cognitive ability adequate to safely complete daily activities?: Yes Is the patient deaf or have difficulty hearing?: No Does the patient have difficulty seeing, even when wearing glasses/contacts?: No Does the patient have difficulty concentrating, remembering, or making decisions?:  (unable to assess) Patient able to express need for assistance with ADLs?: Yes Does the patient have difficulty dressing or bathing?: No Independently performs ADLs?: Yes (appropriate for developmental age) Does the patient have difficulty walking or climbing stairs?: No Weakness of Legs: None Weakness of Arms/Hands: None  Home Assistive  Devices/Equipment Home Assistive Devices/Equipment: None    Abuse/Neglect Assessment (Assessment to be complete while patient is alone) Physical Abuse:  (unable to assess) Verbal Abuse:  (unable to assess) Sexual Abuse:  (unable to assess) Exploitation of patient/patient's resources:  (unable to assess) Self-Neglect:  (unable to assess)     Advance Directives (For Healthcare) Does patient have an advance directive?: No Would patient like information on creating an advanced directive?: No - patient declined information    Additional Information 1:1 In Past 12 Months?: No CIRT Risk: No Elopement Risk: No Does patient have medical clearance?: No     Disposition:  Disposition Initial Assessment Completed for this Encounter: Yes Disposition of Patient: Other dispositions (psych eval tomorrow am)  Lillis Nuttle P 11/24/2015 1:04 PM

## 2015-11-24 NOTE — ED Notes (Signed)
EDP reported from pt family, pt mother reports has spoken with [facility] and [facility]. [facility] reports at capacity. EDP reported would consult TTS.

## 2015-11-24 NOTE — ED Notes (Addendum)
Per EMS, pt chronic alcoholic and reports started binge drinking since monday. Pt alert with sternal rub. Pt reports "fine" when asked triage/assessment questions. cbg 268. Pt family, wants pt to receive inpatient treatment for alcohol abuse. EMS also reports pt is not eating/drinking and not getting out of home recliner to void. nad noted.

## 2015-11-24 NOTE — ED Notes (Signed)
EDP at bedside  

## 2015-11-25 ENCOUNTER — Encounter (HOSPITAL_COMMUNITY): Payer: Self-pay | Admitting: *Deleted

## 2015-11-25 ENCOUNTER — Observation Stay (HOSPITAL_COMMUNITY)
Admission: AD | Admit: 2015-11-25 | Discharge: 2015-11-27 | Disposition: A | Discharge status: Home / Self Care | Payer: Medicare Other | Source: Intra-hospital | Attending: Psychiatry | Admitting: Psychiatry

## 2015-11-25 DIAGNOSIS — F329 Major depressive disorder, single episode, unspecified: Secondary | ICD-10-CM | POA: Insufficient documentation

## 2015-11-25 DIAGNOSIS — F1014 Alcohol abuse with alcohol-induced mood disorder: Secondary | ICD-10-CM | POA: Diagnosis present

## 2015-11-25 DIAGNOSIS — I119 Hypertensive heart disease without heart failure: Secondary | ICD-10-CM | POA: Diagnosis not present

## 2015-11-25 DIAGNOSIS — I6523 Occlusion and stenosis of bilateral carotid arteries: Secondary | ICD-10-CM

## 2015-11-25 DIAGNOSIS — F1994 Other psychoactive substance use, unspecified with psychoactive substance-induced mood disorder: Secondary | ICD-10-CM | POA: Diagnosis not present

## 2015-11-25 DIAGNOSIS — Z794 Long term (current) use of insulin: Secondary | ICD-10-CM | POA: Insufficient documentation

## 2015-11-25 DIAGNOSIS — F1190 Opioid use, unspecified, uncomplicated: Secondary | ICD-10-CM | POA: Insufficient documentation

## 2015-11-25 DIAGNOSIS — I2510 Atherosclerotic heart disease of native coronary artery without angina pectoris: Secondary | ICD-10-CM | POA: Diagnosis not present

## 2015-11-25 DIAGNOSIS — Z7984 Long term (current) use of oral hypoglycemic drugs: Secondary | ICD-10-CM | POA: Diagnosis not present

## 2015-11-25 DIAGNOSIS — K219 Gastro-esophageal reflux disease without esophagitis: Secondary | ICD-10-CM | POA: Insufficient documentation

## 2015-11-25 DIAGNOSIS — I6521 Occlusion and stenosis of right carotid artery: Secondary | ICD-10-CM | POA: Insufficient documentation

## 2015-11-25 DIAGNOSIS — Z8711 Personal history of peptic ulcer disease: Secondary | ICD-10-CM | POA: Insufficient documentation

## 2015-11-25 DIAGNOSIS — E1151 Type 2 diabetes mellitus with diabetic peripheral angiopathy without gangrene: Secondary | ICD-10-CM | POA: Insufficient documentation

## 2015-11-25 DIAGNOSIS — F17210 Nicotine dependence, cigarettes, uncomplicated: Secondary | ICD-10-CM | POA: Insufficient documentation

## 2015-11-25 DIAGNOSIS — Z7982 Long term (current) use of aspirin: Secondary | ICD-10-CM | POA: Insufficient documentation

## 2015-11-25 DIAGNOSIS — E7800 Pure hypercholesterolemia, unspecified: Secondary | ICD-10-CM | POA: Insufficient documentation

## 2015-11-25 DIAGNOSIS — F10120 Alcohol abuse with intoxication, uncomplicated: Secondary | ICD-10-CM | POA: Diagnosis not present

## 2015-11-25 DIAGNOSIS — E876 Hypokalemia: Secondary | ICD-10-CM | POA: Insufficient documentation

## 2015-11-25 DIAGNOSIS — Z951 Presence of aortocoronary bypass graft: Secondary | ICD-10-CM | POA: Diagnosis not present

## 2015-11-25 DIAGNOSIS — F10929 Alcohol use, unspecified with intoxication, unspecified: Secondary | ICD-10-CM | POA: Diagnosis present

## 2015-11-25 LAB — CBG MONITORING, ED
GLUCOSE-CAPILLARY: 220 mg/dL — AB (ref 65–99)
GLUCOSE-CAPILLARY: 91 mg/dL (ref 65–99)

## 2015-11-25 LAB — BASIC METABOLIC PANEL
Anion gap: 12 (ref 5–15)
BUN: 26 mg/dL — AB (ref 6–20)
CALCIUM: 8.7 mg/dL — AB (ref 8.9–10.3)
CO2: 29 mmol/L (ref 22–32)
CREATININE: 0.87 mg/dL (ref 0.61–1.24)
Chloride: 95 mmol/L — ABNORMAL LOW (ref 101–111)
GFR calc Af Amer: >60 mL/min (ref >60)
GLUCOSE: 225 mg/dL — AB (ref 65–99)
POTASSIUM: 3.3 mmol/L — AB (ref 3.5–5.1)
SODIUM: 136 mmol/L (ref 135–145)

## 2015-11-25 LAB — GLUCOSE, CAPILLARY
GLUCOSE-CAPILLARY: 316 mg/dL — AB (ref 65–99)
Glucose-Capillary: 24 mg/dL — CL (ref 65–99)
Glucose-Capillary: 42 mg/dL — CL (ref 65–99)
Glucose-Capillary: 95 mg/dL (ref 65–99)
Glucose-Capillary: 139 mg/dL — ABNORMAL HIGH (ref 65–99)
Glucose-Capillary: 209 mg/dL — ABNORMAL HIGH (ref 65–99)

## 2015-11-25 MED ORDER — INSULIN ASPART 100 UNIT/ML ~~LOC~~ SOLN
0.0000 [IU] | Freq: Three times a day (TID) | SUBCUTANEOUS | Status: DC
  Administered 2015-11-25: 11 [IU] via SUBCUTANEOUS
  Administered 2015-11-26: 5 [IU] via SUBCUTANEOUS
  Administered 2015-11-26 – 2015-11-27 (×2): 3 [IU] via SUBCUTANEOUS

## 2015-11-25 MED ORDER — MAGNESIUM HYDROXIDE 400 MG/5ML PO SUSP: 30.0000 mL | Freq: Every day | ORAL | Status: DC | PRN

## 2015-11-25 MED ORDER — ALUM & MAG HYDROXIDE-SIMETH 200-200-20 MG/5ML PO SUSP
30.0000 mL | ORAL | Status: DC | PRN
  Administered 2015-11-27 (×2): 30 mL via ORAL
  Filled 2015-11-25 (×2): qty 30

## 2015-11-25 MED ORDER — TRAZODONE HCL 50 MG PO TABS
50.0000 mg | ORAL_TABLET | Freq: Every evening | ORAL | Status: DC | PRN
  Administered 2015-11-25 – 2015-11-26 (×2): 50 mg via ORAL
  Filled 2015-11-25 (×2): qty 1

## 2015-11-25 MED ORDER — VITAMIN B-1 100 MG PO TABS
100.0000 mg | ORAL_TABLET | Freq: Every day | ORAL | Status: DC
  Administered 2015-11-25 – 2015-11-27 (×3): 100 mg via ORAL
  Filled 2015-11-25 (×3): qty 1

## 2015-11-25 MED ORDER — INSULIN ASPART 100 UNIT/ML ~~LOC~~ SOLN
3.0000 [IU] | Freq: Three times a day (TID) | SUBCUTANEOUS | Status: DC
  Administered 2015-11-25 – 2015-11-26 (×2): 3 [IU] via SUBCUTANEOUS

## 2015-11-25 MED ORDER — ZINC OXIDE 40 % EX OINT
TOPICAL_OINTMENT | CUTANEOUS | Status: DC | PRN
  Filled 2015-11-25 (×2): qty 114

## 2015-11-25 MED ORDER — ACETAMINOPHEN 325 MG PO TABS: 650.0000 mg | ORAL_TABLET | Freq: Four times a day (QID) | ORAL | Status: DC | PRN

## 2015-11-25 MED ORDER — INSULIN DETEMIR 100 UNIT/ML ~~LOC~~ SOLN
10.0000 [IU] | Freq: Every day | SUBCUTANEOUS | Status: DC
  Administered 2015-11-25 – 2015-11-26 (×2): 10 [IU] via SUBCUTANEOUS

## 2015-11-25 MED ORDER — PANTOPRAZOLE SODIUM 40 MG PO TBEC
40.0000 mg | DELAYED_RELEASE_TABLET | Freq: Every day | ORAL | Status: DC
  Administered 2015-11-25 – 2015-11-27 (×3): 40 mg via ORAL
  Filled 2015-11-25 (×3): qty 1

## 2015-11-25 MED ORDER — LORAZEPAM 0.5 MG PO TABS
0.5000 mg | ORAL_TABLET | ORAL | Status: DC | PRN
  Administered 2015-11-25 – 2015-11-26 (×3): 0.5 mg via ORAL
  Filled 2015-11-25 (×3): qty 1

## 2015-11-25 MED ORDER — ESCITALOPRAM OXALATE 10 MG PO TABS
10.0000 mg | ORAL_TABLET | Freq: Every day | ORAL | Status: DC
  Administered 2015-11-25 – 2015-11-27 (×3): 10 mg via ORAL
  Filled 2015-11-25 (×3): qty 1

## 2015-11-25 MED ORDER — METFORMIN HCL 500 MG PO TABS
500.0000 mg | ORAL_TABLET | Freq: Two times a day (BID) | ORAL | Status: DC
  Administered 2015-11-25 – 2015-11-27 (×4): 500 mg via ORAL
  Filled 2015-11-25 (×4): qty 1

## 2015-11-25 NOTE — ED Notes (Signed)
PT transferred to [facility] at this time.

## 2015-11-25 NOTE — Progress Notes (Signed)
D)Barrier cream received.   Pt. Continues to c/o some agitation related to withdrawal.  A) Support measures offered.  Discussed importance of pt. Remaining clean and dry.  Hygiene encouraged.  Pt. Medicated per orders.  Discussed means of decreasing anxiety around "seeing things on walls".  R) Pt. Receptive and cooperative with care.

## 2015-11-25 NOTE — Consult Note (Signed)
Telepsych Consultation   Reason for Consult:  Alcohol abuse, Depression Referring Physician:  Deneise Lever Pen EDP Patient Identification: Aaron Santiago MRN:  [id number] Principal Diagnosis: Substance induced mood disorder [facility]) Diagnosis:   Patient Active Problem List   Diagnosis Date Noted  . Substance induced mood disorder ([place]) [F19.94] 11/25/2015  . Alcohol intoxication ([place]) [F10.129]   . PAD (peripheral artery disease) ([place]) [I73.9] 02/08/2012  . High cholesterol [E78.00]   . Hypertension [I10]   . 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 Diabetes mellitus [250] 11/05/2011  . CAD (coronary artery disease) [I25.10] 11/05/2011  . Tobacco abuse [Z72.0] 11/05/2011  . Chest pain [786.5] 11/05/2011  . Carotid artery stenosis [I65.29] 10/31/2011    Total Time spent with patient: 30 minutes  Subjective:   Aaron Santiago is a 69 y.o. male patient admitted with history of alcohol abuse and depressive symptoms. Patient states "I've been depressed. It's from getting older. I was abusing my opiates then I drank to help with the pain."   HPI:    Aaron Santiago is a 69 year old male who presented voluntarily to the [facility] with his sister. He was disoriented on admission and was noted to fall asleep frequently. Patient had difficulty providing his history but there is collateral in the chart from his sister. His sister reported patient verbalized passive suicidal ideation but does not have a positive history for this. He has been at several inpatient facilities for alcohol abuse. He was last admitted to [facility] six years ago. The sister reported that the patient has been drinking more since his vicodin was stopped by Primary Care Provider due to misuse. Patient's sister would like patient to go to rehab again. During the assessment today the patient is extremely drowsy and slow to respond to questions. Review of his MAR shows that he received ativan  this morning for withdrawal symptoms. His blood pressure has also been increasing since admission. Of note his alcohol level was 420 when the patient was brought to the ED yesterday. Patient has poor recollection of events that led to his family's concern. Patient continues to be a poor historian during the assessment and at times did not answer questions that were asked of him. He did report feeling depressed but denied suicidal ideation. Patient is currently on an ativan taper to address his recent alcohol use. It is unclear due to patient being a poor historian how long he has been drinking recently. His urine drug screen is negative. Patient is currently alert and oriented during assessment but appears to need further treatment at this time. His most recent CIWA reading was six.   HPI Elements:   Location:  Depression, increased alcohol use, report of opiate abuse. Quality:  Suicide ideation reported by family, alcohol intoxication . Severity:  Severe. Timing:  last few weeks. Duration:  chronic. Context:  chronic pain, alcohol and opiate abuse.  Past Medical History:  Past Medical History  Diagnosis Date  . S/P CABG (coronary artery bypass graft) 2002    LIMA to LAD  . Tobacco abuse current    1/2 ppd smoker  . History of substance abuse     Alcohol, quit 1999.  Cocaine, quit late 1980's.  . Peptic ulcer disease   . Diabetes mellitus   . Stomach ulcer 1972    h/o antrectomy  . GERD (gastroesophageal reflux disease)   . Arthritis   . Carotid artery stenosis 10/2011    60-79% Right  ICA stenosis  . High cholesterol   . Hypertension   . Coronary artery disease   . Seasonal allergies     Past Surgical History  Procedure Laterality Date  . Antrectomy  [id number]  . Cholecystectomy  06/2010  . Fracture surgery    . Femur fracture surgery  ~ 1964    left  . Coronary artery bypass graft  2002    CABG X1  . Coronary angioplasty with stent placement  11/06/11    2 DES to mid LCX and  proximal RCA. 60% LAD stenosis after LIMA anastomosis  . Knee arthroscopy with medial menisectomy Right 06/06/2013    Procedure: RIGHT KNEE ARTHROSCOPY WITH PARTIAL MEDIAL MENISCECTOMY;  Surgeon: Rozanna Box, MD;  Location: [facility];  Service: Orthopedics;  Laterality: Right;  . Left heart catheterization with coronary angiogram N/A 11/06/2011    Procedure: LEFT HEART CATHETERIZATION WITH CORONARY ANGIOGRAM;  Surgeon: Wellington Hampshire, MD;  Location: [facility] CATH LAB;  Service: Cardiovascular;  Laterality: N/A;  . Percutaneous coronary stent intervention (pci-s) N/A 11/06/2011    Procedure: PERCUTANEOUS CORONARY STENT INTERVENTION (PCI-S);  Surgeon: Wellington Hampshire, MD;  Location: [facility] CATH LAB;  Service: Cardiovascular;  Laterality: N/A;   Family History:  Family History  Problem Relation Age of Onset  . Stroke      grandparent  . Hypertension Mother     alive age 49 with pacemaker  . Coronary artery disease      grandparent  . Alcohol abuse Father     died in DWI accident when patient was a child   Social History:  History  Alcohol Use  . Yes    Comment: "red & gray Darvon; anything I could get my hands on; burnt my stomach up w/them; stopped drugs ~ 1980's,  ETOH 03/5363,[id number] alcoholic"     History  Drug Use  . Yes  . Special: Hydrocodone, Marijuana, Cocaine    Social History   Social History  . Marital Status: Single    Spouse Name: N/A  . Number of Children: N/A  . Years of Education: N/A   Social History Main Topics  . Smoking status: Current Every Day Smoker -- 0.50 packs/day for 55 years    Types: Cigarettes  . Smokeless tobacco: Never Used  . Alcohol Use: Yes     Comment: "red & gray Darvon; anything I could get my hands on; burnt my stomach up w/them; stopped drugs ~ 1980's,  ETOH 12/5001,[id number] alcoholic"  . Drug Use: Yes    Special: Hydrocodone, Marijuana, Cocaine  . Sexual Activity: Yes   Other Topics Concern  . None   Social History Narrative    Additional Social History:    Pain Medications: sister reports pt abuses hydrocodone Prescriptions: see PTA meds list - per sister, pt doesn't abuse  Over the Counter: sister reports pt doesn't abuse other drugs History of alcohol / drug use?: Yes Longest period of sobriety (when/how long): 6 Negative Consequences of Use: Financial, Scientist, research (physical sciences), Personal relationships, Work / School Name of Substance 1: alcohol 1 - Age of First Use: 13 1 - Amount (size/oz): unknown 1 - Frequency: daily 1 - Duration: for past 7 days 1 - Last Use / Amount: 11/24/15 - 1.5 gallons vodka Name of Substance 2: opiates - hydrocodone 2 - Age of First Use: unknown 2 - Amount (size/oz): varies 2 - Frequency: whenever med is available 2 - Duration: years 2 - Last Use / Amount: 7 days ago  Allergies:  No Known Allergies  Labs:  Results for orders placed or performed during the hospital encounter of 11/24/15 (from the past 48 hour(s))  Urinalysis, Routine w reflex microscopic (not at [facility])     Status: Abnormal   Collection Time: 11/24/15 12:30 PM  Result Value Ref Range   Color, Urine YELLOW YELLOW   APPearance CLEAR CLEAR   Specific Gravity, Urine 1.010 1.005 - 1.030   pH 5.5 5.0 - 8.0   Glucose, UA 250 (A) NEGATIVE mg/dL   Hgb urine dipstick NEGATIVE NEGATIVE   Bilirubin Urine NEGATIVE NEGATIVE   Ketones, ur NEGATIVE NEGATIVE mg/dL   Protein, ur NEGATIVE NEGATIVE mg/dL   Nitrite NEGATIVE NEGATIVE   Leukocytes, UA NEGATIVE NEGATIVE    Comment: MICROSCOPIC NOT DONE ON URINES WITH NEGATIVE PROTEIN, BLOOD, LEUKOCYTES, NITRITE, OR GLUCOSE <1000 mg/dL.  Urine rapid drug screen (hosp performed)     Status: None   Collection Time: 11/24/15 12:30 PM  Result Value Ref Range   Opiates NONE DETECTED NONE DETECTED   Cocaine NONE DETECTED NONE DETECTED   Benzodiazepines NONE DETECTED NONE DETECTED   Amphetamines NONE DETECTED NONE DETECTED   Tetrahydrocannabinol NONE DETECTED NONE DETECTED    Barbiturates NONE DETECTED NONE DETECTED    Comment:        DRUG SCREEN FOR MEDICAL PURPOSES ONLY.  IF CONFIRMATION IS NEEDED FOR ANY PURPOSE, NOTIFY LAB WITHIN 5 DAYS.        LOWEST DETECTABLE LIMITS FOR URINE DRUG SCREEN Drug Class       Cutoff (ng/mL) Amphetamine      1000 Barbiturate      200 Benzodiazepine   993 Tricyclics       570 Opiates          300 Cocaine          300 THC              50   CBC with Differential/Platelet     Status: Abnormal   Collection Time: 11/24/15 12:42 PM  Result Value Ref Range   WBC 5.5 4.0 - 10.5 K/uL   RBC 5.00 4.22 - 5.81 MIL/uL   Hemoglobin 14.1 13.0 - 17.0 g/dL   HCT 40.8 39.0 - 52.0 %   MCV 81.6 78.0 - 100.0 fL   MCH 28.2 26.0 - 34.0 pg   MCHC 34.6 30.0 - 36.0 g/dL   RDW 15.9 (H) 11.5 - 15.5 %   Platelets 297 150 - 400 K/uL   Neutrophils Relative % 69 %   Neutro Abs 3.8 1.7 - 7.7 K/uL   Lymphocytes Relative 27 %   Lymphs Abs 1.5 0.7 - 4.0 K/uL   Monocytes Relative 4 %   Monocytes Absolute 0.2 0.1 - 1.0 K/uL   Eosinophils Relative 1 %   Eosinophils Absolute 0.0 0.0 - 0.7 K/uL   Basophils Relative 1 %   Basophils Absolute 0.0 0.0 - 0.1 K/uL  Comprehensive metabolic panel     Status: Abnormal   Collection Time: 11/24/15 12:42 PM  Result Value Ref Range   Sodium 135 135 - 145 mmol/L   Potassium 3.0 (L) 3.5 - 5.1 mmol/L   Chloride 96 (L) 101 - 111 mmol/L   CO2 26 22 - 32 mmol/L   Glucose, Bld 247 (H) 65 - 99 mg/dL   BUN 9 6 - 20 mg/dL   Creatinine, Ser 0.74 0.61 - 1.24 mg/dL   Calcium 8.1 (L) 8.9 - 10.3 mg/dL   Total Protein 6.1 (L) 6.5 -  8.1 g/dL   Albumin 2.9 (L) 3.5 - 5.0 g/dL   AST 18 15 - 41 U/L   ALT 22 17 - 63 U/L   Alkaline Phosphatase 86 38 - 126 U/L   Total Bilirubin 0.3 0.3 - 1.2 mg/dL   GFR calc non Af Amer >60 >60 mL/min   GFR calc Af Amer >60 >60 mL/min    Comment: (NOTE) The eGFR has been calculated using the CKD EPI equation. This calculation has not been validated in all clinical situations. eGFR's  persistently <60 mL/min signify possible Chronic Kidney Disease.    Anion gap 13 5 - 15  Ethanol     Status: Abnormal   Collection Time: 11/24/15 12:42 PM  Result Value Ref Range   Alcohol, Ethyl (B) 420 (HH) <5 mg/dL    Comment:        LOWEST DETECTABLE LIMIT FOR SERUM ALCOHOL IS 5 mg/dL FOR MEDICAL PURPOSES ONLY CRITICAL RESULT CALLED TO, READ BACK BY AND VERIFIED WITH: CREWS,M AT 1337 BY HUFFINES,S ON 11/24/15.   Acetaminophen level     Status: Abnormal   Collection Time: 11/24/15 12:42 PM  Result Value Ref Range   Acetaminophen (Tylenol), Serum <10 (L) 10 - 30 ug/mL    Comment:        THERAPEUTIC CONCENTRATIONS VARY SIGNIFICANTLY. A RANGE OF 10-30 ug/mL MAY BE AN EFFECTIVE CONCENTRATION FOR MANY PATIENTS. HOWEVER, SOME ARE BEST TREATED AT CONCENTRATIONS OUTSIDE THIS RANGE. ACETAMINOPHEN CONCENTRATIONS >150 ug/mL AT 4 HOURS AFTER INGESTION AND >50 ug/mL AT 12 HOURS AFTER INGESTION ARE OFTEN ASSOCIATED WITH TOXIC REACTIONS.   Salicylate level     Status: None   Collection Time: 11/24/15 12:42 PM  Result Value Ref Range   Salicylate Lvl <3.6 2.8 - 30.0 mg/dL  Ethanol     Status: Abnormal   Collection Time: 11/24/15  9:29 PM  Result Value Ref Range   Alcohol, Ethyl (B) 189 (H) <5 mg/dL    Comment:        LOWEST DETECTABLE LIMIT FOR SERUM ALCOHOL IS 5 mg/dL FOR MEDICAL PURPOSES ONLY   CBG monitoring, ED     Status: Abnormal   Collection Time: 11/24/15 10:20 PM  Result Value Ref Range   Glucose-Capillary 233 (H) 65 - 99 mg/dL  CBG monitoring, ED     Status: Abnormal   Collection Time: 11/25/15 12:06 AM  Result Value Ref Range   Glucose-Capillary 220 (H) 65 - 99 mg/dL  CBG monitoring, ED     Status: None   Collection Time: 11/25/15  8:46 AM  Result Value Ref Range   Glucose-Capillary 91 65 - 99 mg/dL    Vitals: Blood pressure 172/92, pulse 78, temperature 97.8 F (36.6 C), temperature source Axillary, resp. rate 18, height 6' (1.829 m), weight 68.493 kg (151  lb), SpO2 98 %.  Risk to Self: Suicidal Ideation: No Suicidal Intent: No Is patient at risk for suicide?: No Suicidal Plan?: No Access to Means:  (n/a) What has been your use of drugs/alcohol within the last 12 months?: alcohol for past 7 days, hydrocodone abuse prior to that How many times?: 0 Other Self Harm Risks: none Triggers for Past Attempts:  (n/a) Intentional Self Injurious Behavior: None Risk to Others: Homicidal Ideation: No Thoughts of Harm to Others: No Current Homicidal Intent: No Current Homicidal Plan: No Access to Homicidal Means: No Identified Victim: none History of harm to others?: No (sister denies ) Assessment of Violence: None Noted Violent Behavior Description: none Does patient have  access to weapons?: No Criminal Charges Pending?: No Does patient have a court date: No Prior Inpatient Therapy: Prior Inpatient Therapy: Yes Prior Therapy Dates: from 36 until 6 yrs ago Prior Therapy Facilty/Provider(s): [facility] and other unnamed facilities Reason for Treatment: substance abuse - alcoholism and opioid abuse Prior Outpatient Therapy: Prior Outpatient Therapy: No Prior Therapy Dates: na Prior Therapy Facilty/Provider(s): na Reason for Treatment: na Does patient have an ACCT team?: No Does patient have Intensive In-House Services?  : No Does patient have [facility] services? : No Does patient have P4CC services?: No  Current Facility-Administered Medications  Medication Dose Route Frequency Provider Last Rate Last Dose  . amLODipine (NORVASC) tablet 5 mg  5 mg Oral Daily Tanna Furry, MD   5 mg at 11/25/15 3976  . aspirin EC tablet 81 mg  81 mg Oral Daily Tanna Furry, MD   81 mg at 11/25/15 7341  . carvedilol (COREG) tablet 12.5 mg  12.5 mg Oral BID Tanna Furry, MD   12.5 mg at 11/25/15 0925  . insulin detemir (LEVEMIR) injection 40 Units  40 Units Subcutaneous 1 day or 1 dose Tanna Furry, MD   40 Units at 11/25/15 0030  . LORazepam (ATIVAN) injection 0-4 mg  0-4  mg Intravenous 4 times per day Tanna Furry, MD   0 mg at 11/24/15 2339  . LORazepam (ATIVAN) injection 0-4 mg  0-4 mg Intravenous Q12H Tanna Furry, MD   0 mg at 11/24/15 2339  . LORazepam (ATIVAN) tablet 0-4 mg  0-4 mg Oral 4 times per day Tanna Furry, MD   1 mg at 11/25/15 0549  . LORazepam (ATIVAN) tablet 0-4 mg  0-4 mg Oral Q12H Tanna Furry, MD   0 mg at 11/24/15 2337  . losartan (COZAAR) tablet 50 mg  50 mg Oral Daily Tanna Furry, MD   50 mg at 11/25/15 9379  . metFORMIN (GLUCOPHAGE) tablet 1,000 mg  1,000 mg Oral BID WC Tanna Furry, MD   1,000 mg at 11/25/15 0240  . metoCLOPramide (REGLAN) tablet 5 mg  5 mg Oral TID AC Tanna Furry, MD   5 mg at 11/25/15 9735  . metoprolol (LOPRESSOR) tablet 50 mg  50 mg Oral BID Tanna Furry, MD   50 mg at 11/25/15 3299  . naproxen (NAPROSYN) tablet 500 mg  500 mg Oral BID PRN Tanna Furry, MD      . ondansetron [facility]) tablet 4 mg  4 mg Oral Q8H PRN Tanna Furry, MD      . pantoprazole (PROTONIX) EC tablet 40 mg  40 mg Oral Daily Tanna Furry, MD   40 mg at 11/25/15 0925  . rosuvastatin (CRESTOR) tablet 10 mg  10 mg Oral Daily Tanna Furry, MD   10 mg at 11/25/15 0924  . sucralfate (CARAFATE) 1 GM/10ML suspension 1 g  1 g Oral QID Tanna Furry, MD   1 g at 11/25/15 [phone]  . thiamine (B-1) injection 100 mg  100 mg Intravenous Daily Tanna Furry, MD   100 mg at 11/24/15 2226  . thiamine (VITAMIN B-1) tablet 100 mg  100 mg Oral Daily Tanna Furry, MD   100 mg at 11/25/15 8341   Current Outpatient Prescriptions  Medication Sig Dispense Refill  . acetaminophen (TYLENOL) 500 MG tablet Take 1,000 mg by mouth every 6 (six) hours as needed for moderate pain.    Marland Kitchen amLODipine (NORVASC) 5 MG tablet TAKE ONE TABLET BY MOUTH ONCE DAILY 30 tablet 6  . aspirin EC 81 MG tablet  Take 81 mg by mouth daily.    Marland Kitchen CARAFATE 1 GM/10ML suspension Take 10 mLs by mouth 4 (four) times daily. Take 10 mls by mouth 4 times daily one hour before meals    . carvedilol (COREG) 12.5 MG tablet Take 1 tablet (12.5  mg total) by mouth 2 (two) times daily. 180 tablet 1  . clotrimazole-betamethasone (LOTRISONE) cream Apply 1 application topically daily as needed.    Marland Kitchen DEXILANT 60 MG capsule Take 60 mg by mouth every morning.    . escitalopram (LEXAPRO) 10 MG tablet Take 1 tablet by mouth daily.    . fluticasone (FLONASE) 50 MCG/ACT nasal spray Place 2 sprays into the nose daily.    . hydroxypropyl methylcellulose (ISOPTO TEARS) 2.5 % ophthalmic solution Place 1 drop into both eyes 4 (four) times daily as needed for dry eyes.     . insulin detemir (LEVEMIR) 100 UNIT/ML injection Inject 40 Units into the skin 1 day or 1 dose. Before breakfast    . IRON, FERROUS GLUCONATE, PO Take 65 mg by mouth daily.    Marland Kitchen loratadine (CLARITIN) 10 MG tablet Take 10 mg by mouth daily as needed for allergies or itching.     . losartan (COZAAR) 50 MG tablet Take 1 tablet (50 mg total) by mouth daily. 90 tablet 3  . metFORMIN (GLUCOPHAGE) 500 MG tablet Take 1,000 mg by mouth 2 (two) times daily with a meal.    . metoCLOPramide (REGLAN) 5 MG tablet Take 1 tablet by mouth 3 (three) times daily before meals.    . metoprolol (LOPRESSOR) 50 MG tablet Take 50 mg by mouth daily.     . Multiple Vitamin (MULTIVITAMIN WITH MINERALS) TABS Take 1 tablet by mouth daily.    . naproxen (NAPROSYN) 500 MG tablet Take 500 mg by mouth 2 (two) times daily as needed for moderate pain.    . nitroGLYCERIN (NITROSTAT) 0.4 MG SL tablet Place 1 tablet (0.4 mg total) under the tongue every 5 (five) minutes as needed for chest pain. 25 tablet 1  . Omega-3 Fatty Acids (FISH OIL) 1200 MG CAPS Take 1,200 mg by mouth daily.    . ondansetron (ZOFRAN) 4 MG tablet Take 4 mg by mouth every 8 (eight) hours as needed for nausea or vomiting.      Musculoskeletal: Strength & Muscle Tone: within normal limits Gait & Station: normal Patient leans: N/A  Psychiatric Specialty Exam: Physical Exam  Review of Systems  Psychiatric/Behavioral: Positive for depression,  suicidal ideas and substance abuse.    Blood pressure 172/92, pulse 78, temperature 97.8 F (36.6 C), temperature source Axillary, resp. rate 18, height 6' (1.829 m), weight 68.493 kg (151 lb), SpO2 98 %.Body mass index is 20.47 kg/(m^2).  General Appearance: Disheveled  Eye Sport and exercise psychologist::  Fair  Speech:  Slow  Volume:  Decreased  Mood:  Depressed  Affect:  Constricted  Thought Process:  Goal Directed and Intact  Orientation:  Full (Time, Place, and Person)  Thought Content:  WDL  Suicidal Thoughts:  No  Homicidal Thoughts:  No  Memory:  Immediate;   Fair Recent;   Fair Remote;   Poor  Judgement:  Poor  Insight:  Shallow  Psychomotor Activity:  Decreased  Concentration:  Fair  Recall:  [place]: Good  Akathisia:  No  Handed:  Right  AIMS (if indicated):     Assets:  Communication Skills Desire for Improvement Financial Resources/Insurance Housing Intimacy Leisure Time  ADL's:  Intact  Cognition: WNL  Sleep:      Medical Decision Making: Review of Psycho-Social Stressors (1), Review or order clinical lab tests (1), Established Problem, Worsening (2) and Review of New Medication or Change in Dosage (2)   Treatment Plan Summary: Patient needs further detox from alcohol and assessment for substance abuse treatment needs  Disposition: Transfer to the [place] [facility] when medically stable   DAVIS, LAURA, NP-C 11/25/2015 11:16 AM   Agree with NP Consult  note as above Neita Garnet, MD

## 2015-11-25 NOTE — ED Notes (Signed)
TTS being done at this time.  

## 2015-11-25 NOTE — Progress Notes (Signed)
Patient experienced Hypoglycemic event.  Protocol initated, MD contacted.  20:58 CABG= 24. 15 gm snacks given,  21:13 CABG= 42 with 15 gm snacks given, 21:28 CABG= 95.  Pt to continue intake of snacks.

## 2015-11-25 NOTE — ED Notes (Signed)
Patient transferred to restroom in wheelchair, tolerated well

## 2015-11-25 NOTE — ED Notes (Signed)
Report given to [name], RN at [facility] for OBS bed #5 accepting Dr. Lucianne Muss.

## 2015-11-25 NOTE — ED Notes (Signed)
Patient assisted to restroom.  
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
## 2015-11-25 NOTE — ED Notes (Signed)
[facility] [employer] called to send transporter for pt to [facility].
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
## 2015-11-25 NOTE — Progress Notes (Signed)
Hypoglycemic Event  CBG: 42  Treatment: 15 GM carbohydrate snack  Symptoms: Sweaty and Shaky  Follow-up CBG: Time:21:15 CBG Result:95  Possible Reasons for Event: Inadequate meal intake  Comments/MD notified:Dr. Cobos contacted earlier    Sylvan Cheese

## 2015-11-25 NOTE — Progress Notes (Signed)
Pt accepted to OBS bed 5 by Dr. Jama Flavors to L. Earlene Plater, NP. Number for report is [phone]. Pt can arrive anytime after 11:30am today. Admission is voluntary.  Ilean Skill, MSW, LCSW Clinical Social Work, Disposition  11/25/2015 [phone]

## 2015-11-25 NOTE — Progress Notes (Signed)
Hypoglycemic Event  CBG: 24  Treatment: 15 GM carbohydrate snack  Symptoms: Pale, Sweaty and Shaky  Follow-up CBG: Time:21:00 CBG Result:42  Possible Reasons for Event: Inadequate meal intake  Comments/MD notified:Dr. Cobos notified at 20:58 waiting on response.    Sylvan Cheese  Adult (Non-Pregnant) Hypoglycemia Protocol Treatment Guidelines  1.  RN shall initiate Hypoglycemia Protocol emergency measures immediately when:            w Routine or STAT CBG and/or a lab glucose indicates hypoglycemia (CBG < 70 mg/dl)  2.  Treat the patient according to ability to take PO's and severity of hypoglycemia.   3.  If patient is on GlucoStabilizer, follow directions provided by the [facility] for hypoglycemic events.  4.  If patient on insulin pump, follow Hypoglycemia Protocol.  If patient requires more than one treatment have patient place pump in SUSPEND and notify MD.  DO NOT leave pump in SUSPEND for greater than 30 minutes unless ordered by MD.  A.  Treatment for Mild or Moderate-Patient cooperative and able to swallow    1.  Patient taking PO's and can cooperate   a.  Give one of the following 15 gram CHO options:                           w 1 tube oral dextrose gel                           w 3-4 Glucose tablets                           w 4 oz. Juice                           w 4 oz. regular soda                                    ESRD patients:  clear, regular soda                           w 8 oz. skim milk    b.  Recheck CBG in 15 minutes after treatment                            w If CBG < 70 mg/dl, repeat treatment and recheck until hypoglycemia is resolved                            w If CBG > 70 mg/dl and next meal is more than 1 hour away, give additional 15 grams CHO   2.  Patient NPO-Patient cooperative and no altered mental status    a.  Give 25 ml of D50 IV.   b.  Recheck CBG in 15 minutes after treatment.                             w If CBG is  less than 70 mg/dl, repeat treatment and recheck until hypoglycemia is resolved.   c.  Notify MD for further orders.  SPECIAL CONSIDERATIONS:    a.  If no IV access,                              w Start IV of D5W at [facility]                             w Give 25 ml of D50 IV.    b.  If unable to gain IV access                             w Give Glucagon IM:    i.  1 mg if patient weighs more than 45.5 kg    ii.  0.5 mg if patient weighs less than 45.5 kg   c.  Notify MD for further orders  B.  Treatment for Severe-- Patient unconscious or unable to take PO's safely    1.  Position patient on side   2.  Give 50 ml D50 IV   3.  Recheck CBG in 15 minutes.                    w If CBG is less than 70 mg/dl, repeat treatment and recheck until hypoglycemia is resolved.   4.  Notify MD for further orders.    SPECIAL CONSIDERATIONS:    a.  If no IV access                              w Give Glucagon IM                                        i.  1 mg if patient weighs more than 45.5 kg                                       ii.  0.5 mg if patient weighs less than 45.5 kg                              w Start IV of D5W at 50 ml/hr and give 50 ml D50 IV   b.  If no IV access and active seizure                               w Call Rapid Response   c.  If unable to gain IV access, give Glucagon IM:                              w 1 mg if patient weighs more than 45.5 kg                              w 0.5 mg if patient weighs less than 45.5 kg   d.  Notify MD for further orders.  C.  Complete smart text progress note to document intervention and follow-up CBG   1.  In  [facility] patient chart, click on Notes (left side of screen)   2.  Create Progress Note   3.  Click on [employer].  In the Match box type "hypo" and enter    4.  Double click on [facility] IP HYPOGLYCEMIC EVENT and enter data   5.  MD must be notified if patient is NPO or experienced severe hypoglycemia

## 2015-11-26 DIAGNOSIS — F1014 Alcohol abuse with alcohol-induced mood disorder: Secondary | ICD-10-CM | POA: Diagnosis not present

## 2015-11-26 DIAGNOSIS — F1994 Other psychoactive substance use, unspecified with psychoactive substance-induced mood disorder: Secondary | ICD-10-CM | POA: Diagnosis not present

## 2015-11-26 LAB — GLUCOSE, CAPILLARY
GLUCOSE-CAPILLARY: 162 mg/dL — AB (ref 65–99)
GLUCOSE-CAPILLARY: 207 mg/dL — AB (ref 65–99)
GLUCOSE-CAPILLARY: 193 mg/dL — AB (ref 65–99)
Glucose-Capillary: 105 mg/dL — ABNORMAL HIGH (ref 65–99)

## 2015-11-26 MED ORDER — LORAZEPAM 0.5 MG PO TABS
0.5000 mg | ORAL_TABLET | Freq: Four times a day (QID) | ORAL | Status: DC | PRN
  Administered 2015-11-27: 0.5 mg via ORAL
  Filled 2015-11-26: qty 1

## 2015-11-26 MED ORDER — POTASSIUM CHLORIDE CRYS ER 20 MEQ PO TBCR
20.0000 meq | EXTENDED_RELEASE_TABLET | Freq: Two times a day (BID) | ORAL | Status: DC
  Administered 2015-11-26 – 2015-11-27 (×3): 20 meq via ORAL
  Filled 2015-11-26 (×4): qty 1

## 2015-11-26 NOTE — BHH Counselor (Signed)
This Clinical research associate recv'd phone call from [place], [place] [phone]), per nurse Dawn V., pt has been accepted by Dr. Shon Hough for 28-day treatment at the facility.  Pt will not be receiving any opiates/pain meds and this writer made pt aware of stipulation for treatment.  Pt is agreeable to terms. Dawn informed this Clinical research associate that facility will provide transportation for patient and they need to be contacted as soon as pt has been medically cleared by psychiatrist so arrangements can be made to pick up patient at [facility]. It is 3-4 hr transport. Dawn is requesting final MAR and d/c notes stating pt is cleared for release, pls fax information to [phone]. Pt's insurance has been verified by facility.

## 2015-11-26 NOTE — Progress Notes (Signed)
Patient has been continuously observed except during bathroom breaks  

## 2015-11-26 NOTE — Progress Notes (Signed)
Pt has large red, rash, skin compromise to both buttocks.  Pt states area is irritable. [place] 40% applied to both buttocks after pt took shower and dried off completely at 22:30. Pt states area feels much better.

## 2015-11-26 NOTE — BH Assessment (Addendum)
BHH Assessment Progress Note Marchelle Folks from [place] states that they still have not had a chance to review pt's referral, but will call Terri tonight if it is reviewed. Phone [phone])

## 2015-11-26 NOTE — BH Assessment (Signed)
[facility]HH Assessment Progress Note  Spoke with sister [name], who states that pt is a chronic alcoholic, "he drinks it until he blacks out, and can't get up out of the recliner and pee and poops on himself. He is a Educational psychologist and will say that he does not want to drink, but he is not capable of making that decision. 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 I called 911 to get help for him because some of his bodily functions are shutting down, and he cannot stop it. He has lost so much weight. he tells you he has been sober for 6 years, but he has been on some kind of Rx--Oxycodone or Hydrocodone, which he has been abusing, and when it ran out, he went back to alcohol".  I just got a call from him, and he says he does not want to drink and wants to go home when he feels stronger, but his ultimate goal is to get out and get to the alcohol. He needs long term treatment".  Sister states she is investigating getting a SA IVC, but mental health is closed today.  Notified Fransisca Kaufmann, NP.

## 2015-11-26 NOTE — Progress Notes (Signed)
[place] Unit Progress Note  11/26/2015 11:53 AM Aaron Santiago  MRN:  [id number] Subjective:    Patient states "I still have the quivers and shakes. I feel very weak. Like my legs want to give out. I can't care for myself right now at home. The skin on my bottom hurts. Yes I know that it came from drinking too much vodka. I would like help for my substance abuse. I was abusing the opiates then I turned back to drinking when I did not have any more of those."   Objective:   Patient is seen and chart is reviewed. Patient reports symptoms of depression today. He reports feeling weak from drinking heavily for the last month. Shuan describes feeling weak, having tremors, and having stomach discomfort. The excoriated area to his buttock area is healing with a barrier cream. Patient does not feel able to care for himself at home right now due to the negative effects of alcohol. He is requesting help finding a facility to help with substance abuse. His sister who patient identifies as his only support was contacted by the counselor today for collateral information. She expressed significant concerns about his alcohol use and the negative effects on his health. His sister feels that patient has minimized his substance abuse problems. Patient maintains that he only relapsed on alcohol because of running out of his opiates. His sister maintains that patient has a long addiction history with recent abuse of opiates only returning to alcohol because his MD stopped prescribing them. She is concerned about his state of health which is what prompted her to call 911 recently. The sister is also concerned that patient will return to drinking when he goes back home. Patient has been receiving prn doses of ativan while in the Observation Unit.   Principal Problem: Substance induced mood disorder ([place]) Diagnosis:   Patient Active Problem List   Diagnosis Date Noted  . Substance induced mood disorder ([place])  [F19.94] 11/25/2015  . Alcohol intoxication ([place]) [F10.129]   . PAD (peripheral artery disease) ([place]) [I73.9] 02/08/2012  . High cholesterol [E78.00]   . Hypertension [I10]   . 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 Diabetes mellitus [250] 11/05/2011  . CAD (coronary artery disease) [I25.10] 11/05/2011  . Tobacco abuse [Z72.0] 11/05/2011  . Chest pain [786.5] 11/05/2011  . Carotid artery stenosis [I65.29] 10/31/2011   Total Time spent with patient: 30 minutes  Past Psychiatric History:  Alcohol abuse   Past Medical History:  Past Medical History  Diagnosis Date  . S/P CABG (coronary artery bypass graft) 2002    LIMA to LAD  . Tobacco abuse current    1/2 ppd smoker  . History of substance abuse     Alcohol, quit 1999.  Cocaine, quit late 1980's.  . Peptic ulcer disease   . Diabetes mellitus   . Stomach ulcer 1972    h/o antrectomy  . GERD (gastroesophageal reflux disease)   . Arthritis   . Carotid artery stenosis 10/2011    60-79% Right ICA stenosis  . High cholesterol   . Hypertension   . Coronary artery disease   . Seasonal allergies     Past Surgical History  Procedure Laterality Date  . Antrectomy  [id number]  . Cholecystectomy  06/2010  . Fracture surgery    . Femur fracture surgery  ~ 1964    left  . Coronary artery bypass graft  2002    CABG X1  .  Coronary angioplasty with stent placement  11/06/11    2 DES to mid LCX and proximal RCA. 60% LAD stenosis after LIMA anastomosis  . Knee arthroscopy with medial menisectomy Right 06/06/2013    Procedure: RIGHT KNEE ARTHROSCOPY WITH PARTIAL MEDIAL MENISCECTOMY;  Surgeon: Rozanna Box, MD;  Location: [facility];  Service: Orthopedics;  Laterality: Right;  . Left heart catheterization with coronary angiogram N/A 11/06/2011    Procedure: LEFT HEART CATHETERIZATION WITH CORONARY ANGIOGRAM;  Surgeon: Wellington Hampshire, MD;  Location: [facility] CATH LAB;  Service: Cardiovascular;   Laterality: N/A;  . Percutaneous coronary stent intervention (pci-s) N/A 11/06/2011    Procedure: PERCUTANEOUS CORONARY STENT INTERVENTION (PCI-S);  Surgeon: Wellington Hampshire, MD;  Location: [facility] CATH LAB;  Service: Cardiovascular;  Laterality: N/A;   Family History:  Family History  Problem Relation Age of Onset  . Stroke      grandparent  . Hypertension Mother     alive age 39 with pacemaker  . Coronary artery disease      grandparent  . Alcohol abuse Father     died in DWI accident when patient was a child   Social History:  History  Alcohol Use  . Yes    Comment: "red & gray Darvon; anything I could get my hands on; burnt my stomach up w/them; stopped drugs ~ 1980's,  ETOH 04/3816,RNHAFBXUXY alcoholic"     History  Drug Use  . Yes  . Special: Hydrocodone, Marijuana, Cocaine    Comment: pt. denies any use except hydrocodone and alcohol at this time    Social History   Social History  . Marital Status: Single    Spouse Name: N/A  . Number of Children: N/A  . Years of Education: N/A   Social History Main Topics  . Smoking status: Current Every Day Smoker -- 0.50 packs/day for 55 years    Types: Cigarettes  . Smokeless tobacco: Never Used  . Alcohol Use: Yes     Comment: "red & gray Darvon; anything I could get my hands on; burnt my stomach up w/them; stopped drugs ~ 1980's,  ETOH 01/3382,ANVBTYOMAY alcoholic"  . Drug Use: Yes    Special: Hydrocodone, Marijuana, Cocaine     Comment: pt. denies any use except hydrocodone and alcohol at this time  . Sexual Activity: Not Currently   Other Topics Concern  . Not on file   Social History Narrative   Additional Social History:    Pain Medications: hydrocodone 10/325  (one tab) 2-3 times per day Longest period of sobriety (when/how long): sober for 6 years and then relapsed 2 days ago when hydrocodone ran out                    Sleep: Fair  Appetite:  Fair  Current Medications: Current Facility-Administered  Medications  Medication Dose Route Frequency Provider Last Rate Last Dose  . acetaminophen (TYLENOL) tablet 650 mg  650 mg Oral Q6H PRN Niel Hummer, NP      . alum & mag hydroxide-simeth (MAALOX/MYLANTA) 200-200-20 MG/5ML suspension 30 mL  30 mL Oral Q4H PRN Niel Hummer, NP      . escitalopram (LEXAPRO) tablet 10 mg  10 mg Oral Daily Niel Hummer, NP   10 mg at 11/26/15 0723  . insulin aspart (novoLOG) injection 0-15 Units  0-15 Units Subcutaneous TID WC Niel Hummer, NP   3 Units at 11/26/15 1151  . insulin detemir (LEVEMIR) injection 10 Units  10 Units Subcutaneous QHS Niel Hummer, NP   10 Units at 11/25/15 2310  . liver oil-zinc oxide (DESITIN) 40 % ointment   Topical PRN Niel Hummer, NP      . LORazepam (ATIVAN) tablet 0.5 mg  0.5 mg Oral Q4H PRN Niel Hummer, NP   0.5 mg at 11/26/15 1101  . magnesium hydroxide (MILK OF MAGNESIA) suspension 30 mL  30 mL Oral Daily PRN Niel Hummer, NP      . metFORMIN (GLUCOPHAGE) tablet 500 mg  500 mg Oral BID WC Niel Hummer, NP   500 mg at 11/26/15 4503  . pantoprazole (PROTONIX) EC tablet 40 mg  40 mg Oral Daily Niel Hummer, NP   40 mg at 11/26/15 8882  . potassium chloride SA (K-DUR,KLOR-CON) CR tablet 20 mEq  20 mEq Oral BID Niel Hummer, NP   20 mEq at 11/26/15 1152  . thiamine (VITAMIN B-1) tablet 100 mg  100 mg Oral Daily Niel Hummer, NP   100 mg at 11/26/15 8003  . traZODone (DESYREL) tablet 50 mg  50 mg Oral QHS PRN Niel Hummer, NP   50 mg at 11/25/15 2328    Lab Results:  Results for orders placed or performed during the hospital encounter of 11/25/15 (from the past 48 hour(s))  Glucose, capillary     Status: Abnormal   Collection Time: 11/25/15  5:03 PM  Result Value Ref Range   Glucose-Capillary 316 (H) 65 - 99 mg/dL   Comment 1 Notify RN   Basic metabolic panel     Status: Abnormal   Collection Time: 11/25/15  6:20 PM  Result Value Ref Range   Sodium 136 135 - 145 mmol/L   Potassium 3.3 (L) 3.5 - 5.1 mmol/L    Chloride 95 (L) 101 - 111 mmol/L   CO2 29 22 - 32 mmol/L   Glucose, Bld 225 (H) 65 - 99 mg/dL   BUN 26 (H) 6 - 20 mg/dL   Creatinine, Ser 0.87 0.61 - 1.24 mg/dL   Calcium 8.7 (L) 8.9 - 10.3 mg/dL   GFR calc non Af Amer >60 >60 mL/min   GFR calc Af Amer >60 >60 mL/min    Comment: (NOTE) The eGFR has been calculated using the CKD EPI equation. This calculation has not been validated in all clinical situations. eGFR's persistently <60 mL/min signify possible Chronic Kidney Disease.    Anion gap 12 5 - 15    Comment: Performed at [facility]  Glucose, capillary     Status: Abnormal   Collection Time: 11/25/15  8:44 PM  Result Value Ref Range   Glucose-Capillary 24 (LL) 65 - 99 mg/dL  Glucose, capillary     Status: Abnormal   Collection Time: 11/25/15  9:11 PM  Result Value Ref Range   Glucose-Capillary 42 (LL) 65 - 99 mg/dL  Glucose, capillary     Status: None   Collection Time: 11/25/15  9:22 PM  Result Value Ref Range   Glucose-Capillary 95 65 - 99 mg/dL  Glucose, capillary     Status: Abnormal   Collection Time: 11/25/15 10:01 PM  Result Value Ref Range   Glucose-Capillary 139 (H) 65 - 99 mg/dL  Glucose, capillary     Status: Abnormal   Collection Time: 11/25/15 11:04 PM  Result Value Ref Range   Glucose-Capillary 209 (H) 65 - 99 mg/dL  Glucose, capillary     Status: Abnormal   Collection Time: 11/26/15  6:11 AM  Result Value Ref Range   Glucose-Capillary 105 (H) 65 - 99 mg/dL  Glucose, capillary     Status: Abnormal   Collection Time: 11/26/15 11:35 AM  Result Value Ref Range   Glucose-Capillary 162 (H) 65 - 99 mg/dL    Physical Findings: AIMS: Facial and Oral Movements Muscles of Facial Expression: None, normal Lips and Perioral Area: None, normal Jaw: None, normal Tongue: None, normal,Extremity Movements Upper (arms, wrists, hands, fingers): None, normal Lower (legs, knees, ankles, toes): None, normal, Trunk Movements Neck, shoulders, hips:  None, normal, Overall Severity Severity of abnormal movements (highest score from questions above): None, normal Incapacitation due to abnormal movements: None, normal Patient's awareness of abnormal movements (rate only patient's report): No Awareness, Dental Status Current problems with teeth and/or dentures?: No Does patient usually wear dentures?: No  CIWA:  CIWA-Ar Total: 2 COWS:  COWS Total Score: 3  Musculoskeletal: Strength & Muscle Tone: decreased Gait & Station: unsteady Patient leans: N/A  Psychiatric Specialty Exam: Review of Systems  Constitutional: Positive for weight loss and malaise/fatigue.  Skin: Positive for rash.  Neurological: Positive for tremors and weakness.  Psychiatric/Behavioral: Positive for depression and substance abuse. The patient is nervous/anxious.     Blood pressure 144/78, pulse 84, temperature 98.9 F (37.2 C), temperature source Oral, resp. rate 18, height 5' 9.25" (1.759 m), weight 65.318 kg (144 lb), SpO2 97 %.Body mass index is 21.11 kg/(m^2).  General Appearance: Disheveled  Eye Sport and exercise psychologist::  Fair  Speech:  Clear and Coherent  Volume:  Decreased  Mood:  Depressed  Affect:  Flat  Thought Process:  Goal Directed and Intact  Orientation:  Full (Time, Place, and Person)  Thought Content:  Rumination  Suicidal Thoughts:  No  Homicidal Thoughts:  No  Memory:  Immediate;   Fair Recent;   Fair Remote;   Fair  Judgement:  Impaired  Insight:  Shallow  Psychomotor Activity:  Tremor  Concentration:  Fair  Recall:  [place] of Knowledge:Good  Language: Good  Akathisia:  No  Handed:  Right  AIMS (if indicated):     Assets:  Communication Skills Desire for Improvement Financial Resources/Insurance Housing Resilience Social Support  ADL's:  Intact  Cognition: WNL  Sleep:      Treatment Plan Summary: Daily contact with patient to assess and evaluate symptoms and progress in treatment and Medication management  -Continue Lexapro 10 mg  daily for depression -Start KDUR 20 meq times four doses for hypokalemia -Continue Ativan 0.5 mg every six hours prn withdrawal symptoms with CIWA -Counselor to explore treatment options for rehab to address opiate and alcohol addiction  -Discontinue Novolog meal coverage due to hypoglycemic event last night -Continue Novolog moderate SSI and Levemir 10 units at bedtime.   Elmarie Shiley, NP-C 11/26/2015, 11:53 AM Agree with NP progress note as above Neita Garnet, MD

## 2015-11-27 DIAGNOSIS — F1014 Alcohol abuse with alcohol-induced mood disorder: Secondary | ICD-10-CM | POA: Diagnosis not present

## 2015-11-27 LAB — GLUCOSE, CAPILLARY
GLUCOSE-CAPILLARY: 164 mg/dL — AB (ref 65–99)
Glucose-Capillary: 230 mg/dL — ABNORMAL HIGH (ref 65–99)

## 2015-11-27 MED ORDER — ESCITALOPRAM OXALATE 10 MG PO TABS: 10.0000 mg | ORAL_TABLET | Freq: Every day | ORAL | Status: DC

## 2015-11-27 MED ORDER — CARAFATE 1 GM/10ML PO SUSP: 1.0000 g | Freq: Four times a day (QID) | ORAL | Status: DC

## 2015-11-27 MED ORDER — CLOTRIMAZOLE-BETAMETHASONE 1-0.05 % EX CREA: 1.0000 "application " | TOPICAL_CREAM | Freq: Every day | CUTANEOUS | Status: DC | PRN

## 2015-11-27 MED ORDER — IRON (FERROUS GLUCONATE) 256 (28 FE) MG PO TABS: 65.0000 mg | ORAL_TABLET | Freq: Every day | ORAL | Status: DC

## 2015-11-27 MED ORDER — METOCLOPRAMIDE HCL 5 MG PO TABS: 5.0000 mg | ORAL_TABLET | Freq: Three times a day (TID) | ORAL | Status: DC

## 2015-11-27 MED ORDER — FLUTICASONE PROPIONATE 50 MCG/ACT NA SUSP: 2.0000 | Freq: Every day | NASAL | Status: DC

## 2015-11-27 MED ORDER — ADULT MULTIVITAMIN W/MINERALS CH: 1.0000 | ORAL_TABLET | Freq: Every day | ORAL | Status: AC

## 2015-11-27 MED ORDER — LOSARTAN POTASSIUM 50 MG PO TABS: 50.0000 mg | ORAL_TABLET | Freq: Every day | ORAL | Status: DC

## 2015-11-27 MED ORDER — ASPIRIN EC 81 MG PO TBEC: 81.0000 mg | DELAYED_RELEASE_TABLET | Freq: Every day | ORAL | Status: AC

## 2015-11-27 MED ORDER — METFORMIN HCL 500 MG PO TABS: 1000.0000 mg | ORAL_TABLET | Freq: Two times a day (BID) | ORAL | Status: DC

## 2015-11-27 MED ORDER — INSULIN DETEMIR 100 UNIT/ML ~~LOC~~ SOLN: 40.0000 [IU] | SUBCUTANEOUS | Status: DC

## 2015-11-27 MED ORDER — CARVEDILOL 12.5 MG PO TABS: 12.5000 mg | ORAL_TABLET | Freq: Two times a day (BID) | ORAL | Status: DC

## 2015-11-27 MED ORDER — AMLODIPINE BESYLATE 5 MG PO TABS: 5.0000 mg | ORAL_TABLET | Freq: Every day | ORAL | Status: DC

## 2015-11-27 MED ORDER — FISH OIL 1200 MG PO CAPS: 1200.0000 mg | ORAL_CAPSULE | Freq: Every day | ORAL | Status: AC

## 2015-11-27 MED ORDER — DEXILANT 60 MG PO CPDR: 60.0000 mg | DELAYED_RELEASE_CAPSULE | Freq: Every morning | ORAL | Status: AC

## 2015-11-27 MED ORDER — ACETAMINOPHEN 325 MG PO TABS: 650.0000 mg | ORAL_TABLET | Freq: Four times a day (QID) | ORAL | Status: DC | PRN

## 2015-11-27 NOTE — Progress Notes (Signed)
Patient ID: Aaron Santiago, male   DOB: 09/10/1946, 69 y.o.   MRN: [id number] Patient discharged per MD orders. Patient given education regarding follow-up appointments and medications. Patient denies any questions or concerns about these instructions. Patient was escorted to locker and given belongings before discharge to hospital lobby. Patient currently denies SI/HI and auditory and visual hallucinations on discharge.

## 2015-11-27 NOTE — BH Assessment (Addendum)
BHH Assessment Progress Note  Patient will be discharged this date and return to his residence where he will follow up with [facility] in [place] to address health concerns. Patient declined any inpatient/residential treatment at this time stating the treatment center he had been considered for "[facility]," was to far away but was given that contact information on discharge.   Patient stated that he would like to make his own appointments after he finds assistance with transportation. Patient was also given a list of outpatient providers to assist with follow up care for substance abuse issues. Patient stated he will start back into AA meetings on 11/28/15 and also obtain a sponsor. This Clinical research associate discussed relapse prevention and provided information on triggers and coping mechanisms. Collateral information was obtained from patient's sister who agreed to assist patient with follow up care.

## 2015-11-27 NOTE — Progress Notes (Signed)
Aaron Santiago 69 yo WM in bed at shift change.  Alert and conversant with another patient.  States his rash on his bottom is much better with no pain or discomfort.  Reminds this Clinical research associate that he wants his "sleep" meds as early as possible.  BS= 193 at 21:30 with 10 units given.  Pt is [place]. Snacks and diabetic drink given. Pt now asleep  Patient has been continuously observed, except during bathroom visits.

## 2015-11-27 NOTE — Discharge Instructions (Signed)
Patient will be discharged this date and return to his residence where he will follow up with [facility] in [place] to address health concerns. Patient stated that he would like to make his own appointments after he finds assistance with transportation. Patient was also given a list of outpatient providers to assist with follow up care for substance abuse issues. Patient stated he will start back into AA meetings on 11/28/15 and also obtain a sponsor. This Clinical research associate discussed relapse prevention and provided information on triggers and coping mechanisms.

## 2015-11-28 NOTE — Discharge Summary (Signed)
[facility]Observation Unit Discharge Summary Note  Patient:  Aaron Santiago is an 69 y.o., male MRN:  [id number] DOB:  Feb 27, 1946 Patient phone:  [phone] (home)  Patient address:   [address] [place] [address],  Total Time spent with patient: 30 minutes  Date of Admission:  11/25/2015 Date of Discharge: 11/27/2015  Reason for Admission:  Alcohol abuse  Principal Problem: Substance induced mood disorder [facility]) Discharge Diagnoses: Patient Active Problem List   Diagnosis Date Noted  . Substance induced mood disorder (HCC) [F19.94] 11/25/2015  . Alcohol intoxication (HCC) [F10.129]   . PAD (peripheral artery disease) (HCC) [I73.9] 02/08/2012  . High cholesterol [E78.00]   . Hypertension [I10]   . 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 Diabetes mellitus [250] 11/05/2011  . CAD (coronary artery disease) [I25.10] 11/05/2011  . Tobacco abuse [Z72.0] 11/05/2011  . Chest pain [786.5] 11/05/2011  . Carotid artery stenosis [I65.29] 10/31/2011    Past Psychiatric History: Long history of alcohol abuse   Past Medical History:  Past Medical History  Diagnosis Date  . S/P CABG (coronary artery bypass graft) 2002    LIMA to LAD  . Tobacco abuse current    1/2 ppd smoker  . History of substance abuse     Alcohol, quit 1999.  Cocaine, quit late 1980's.  . Peptic ulcer disease   . Diabetes mellitus   . Stomach ulcer 1972    h/o antrectomy  . GERD (gastroesophageal reflux disease)   . Arthritis   . Carotid artery stenosis 10/2011    60-79% Right ICA stenosis  . High cholesterol   . Hypertension   . Coronary artery disease   . Seasonal allergies     Past Surgical History  Procedure Laterality Date  . Antrectomy  [id number]  . Cholecystectomy  06/2010  . Fracture surgery    . Femur fracture surgery  ~ 1964    left  . Coronary artery bypass graft  2002    CABG X1  . Coronary angioplasty with stent placement  11/06/11    2  DES to mid LCX and proximal RCA. 60% LAD stenosis after LIMA anastomosis  . Knee arthroscopy with medial menisectomy Right 06/06/2013    Procedure: RIGHT KNEE ARTHROSCOPY WITH PARTIAL MEDIAL MENISCECTOMY;  Surgeon: Budd Palmer, MD;  Location: [facility] OR;  Service: Orthopedics;  Laterality: Right;  . Left heart catheterization with coronary angiogram N/A 11/06/2011    Procedure: LEFT HEART CATHETERIZATION WITH CORONARY ANGIOGRAM;  Surgeon: Iran Ouch, MD;  Location: [facility] CATH LAB;  Service: Cardiovascular;  Laterality: N/A;  . Percutaneous coronary stent intervention (pci-s) N/A 11/06/2011    Procedure: PERCUTANEOUS CORONARY STENT INTERVENTION (PCI-S);  Surgeon: Iran Ouch, MD;  Location[facility] CATH LAB;  Service: Cardiovascular;  Laterality: N/A;   Family History:  Family History  Problem Relation Age of Onset  . Stroke      grandparent  . Hypertension Mother     alive age 69 with pacemaker  . Coronary artery disease      grandparent  . Alcohol abuse Father     died in DWI accident when patient was a child   Social History:  History  Alcohol Use  . Yes    Comment: "red & gray Darvon; anything I could get my hands on; burnt my stomach up w/them; stopped drugs ~ 1980's,  ETOH 12/2008,recovering alcoholic"     History  Drug Use  . Yes  . Special:  Hydrocodone, Marijuana, Cocaine    Comment: pt. denies any use except hydrocodone and alcohol at this time    Social History   Social History  . Marital Status: Single    Spouse Name: N/A  . Number of Children: N/A  . Years of Education: N/A   Social History Main Topics  . Smoking status: Current Every Day Smoker -- 0.50 packs/day for 55 years    Types: Cigarettes  . Smokeless tobacco: Never Used  . 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 Drug Use: Yes    Special: Hydrocodone, Marijuana, Cocaine      Comment: pt. denies any use except hydrocodone and alcohol at this time  . Sexual Activity: Not Currently   Other Topics Concern  . Not on file   Social History Narrative    Hospital Course:     Aaron Santiago is a 69 year old male who presented voluntarily to the [facility] with his sister. He was disoriented on admission and was noted to fall asleep frequently. Patient had difficulty providing his history but there is collateral in the chart from his sister. His sister reported patient verbalized passive suicidal ideation but does not have a positive history for this. He has been at several inpatient facilities for alcohol abuse. He was last admitted to [facility] six years ago. The sister reported that the patient has been drinking more since his vicodin was stopped by Primary Care Provider due to misuse and drank 1.5 gallons of vodka yesterday. The patient admits to having abused the opiates and then using alcohol to cope with some chronic back/leg pain. Patient's sister would like patient to go to rehab again. During the assessment today the patient is extremely drowsy and slow to respond to questions. Review of his MAR shows that he received ativan this morning for withdrawal symptoms. His blood pressure has also been increasing since admission. Of note his alcohol level was 420 when the patient was brought to the ED yesterday. Patient has poor recollection of events that led to his family's concern. Patient continues to be a poor historian during the assessment and at times did not answer questions that were asked of him. He did report feeling depressed but denied suicidal ideation. Patient is currently on an ativan taper to address his recent alcohol use. It is unclear due to patient being a poor historian how long he has been drinking recently. His urine drug screen is negative. Patient is currently alert and oriented during assessment but appears to need further treatment at this time. His most recent CIWA  reading was six. The patient admits to some moderate depressive symptoms due to "dealing with the aging process." Review of recent notes in epic indicates that patient was seen just recently in the ED on 11/21/2015 for similar complaints having a rash on his buttocks due to poor self care while drinking excessively. According to notes the patient left AMA before a psych assessment could be completed at that time. Patient also reported having been started on Lexapro 10 mg daily one week ago by Primary Care but does not appear to be taking as prescribed.   Patient was admitted to the [facility] Unit for further assessment and detoxification from alcohol. The patient complained with withdrawal symptoms and weakness from abusing alcohol for at least a month. He was placed on  an ativan taper. Patient was given supplemental potassium to correct hypokalemia likely due to patient being malnourished. Aaron Santiago admitted to poor oral intake due to ingestion of excessive amounts of vodka. His sister was contacted for collateral information who reported patient had a long history of alcohol abuse. She believed that when patient recently ran out of vicodin that he resorted to drinking. Patient was noted to have an excoriated area to his buttocks. His sister reported that patient would often black out in his recliner, which resulted in patient soiling himself with resultant skin irritation. A skin barrier ointment was applied to this area during his stay in Observation unit with significant improvement. Patient will be discharged this date and return to his residence where he will follow up with [facility] in [place] to address health concerns. His Diabetes was managed while in the Observation Unit but blood glucose readings was noted to be very elevated. Patient experienced one hypoglycemic episode that was managed via protocol with no adverse outcomes. Patient declined any inpatient/residential treatment at  this time stating the treatment center he had been considered for "[facility]," was to far away but was given that contact information on discharge. Patient stated that he would like to make his own appointments after he finds assistance with transportation. Patient was also given a list of outpatient providers to assist with follow up care for substance abuse issues. Patient stated he will start back into AA meetings on 11/28/15 and also obtain a sponsor. Donaciano left [facility] in stable condition with all belongings returned to him and as picked up by his sister from the lobby.    Physical Findings: AIMS: Facial and Oral Movements Muscles of Facial Expression: None, normal Lips and Perioral Area: None, normal Jaw: None, normal Tongue: None, normal,Extremity Movements Upper (arms, wrists, hands, fingers): None, normal Lower (legs, knees, ankles, toes): None, normal, Trunk Movements Neck, shoulders, hips: None, normal, Overall Severity Severity of abnormal movements (highest score from questions above): None, normal Incapacitation due to abnormal movements: None, normal Patient's awareness of abnormal movements (rate only patient's report): No Awareness, Dental Status Current problems with teeth and/or dentures?: No Does patient usually wear dentures?: No  CIWA:  CIWA-Ar Total: 2 COWS:  COWS Total Score: 3  Musculoskeletal: Strength & Muscle Tone: within normal limits Gait & Station: normal Patient leans: N/A  Psychiatric Specialty Exam: Review of Systems  Constitutional: Negative.   HENT: Negative.   Eyes: Negative.   Respiratory: Negative.   Cardiovascular: Negative.   Gastrointestinal: Negative.   Genitourinary: Negative.   Musculoskeletal: Negative.   Skin: Negative.   Neurological: Negative.   Endo/Heme/Allergies: Negative.   Psychiatric/Behavioral: Positive for substance abuse. Negative for depression, suicidal ideas, hallucinations and memory loss. The patient is  nervous/anxious. The patient does not have insomnia.     Blood pressure 166/81, pulse 97, temperature 99.5 F (37.5 C), temperature source Oral, resp. rate 18, height 5' 9.25" (1.759 m), weight 65.318 kg (144 lb), SpO2 97 %.Body mass index is 21.11 kg/(m^2).  General Appearance: Casual  Eye Contact::  Good  Speech:  Clear and Coherent  Volume:  Normal  Mood:  Anxious  Affect:  Appropriate  Thought Process:  Goal Directed and Intact  Orientation:  Full (Time, Place, and Person)  Thought Content:  WDL  Suicidal Thoughts:  No  Homicidal Thoughts:  No  Memory:  Immediate;   Good Recent;   Fair Remote;   Fair  Judgement:  Impaired  Insight:  Shallow  Psychomotor Activity:  Normal  Concentration:  Good  Recall:  Fair  Fund of Knowledge:Good  Language: Good  Akathisia:  No  Handed:  Right  AIMS (if indicated):     Assets:  Communication Skills Desire for Improvement Financial Resources/Insurance Housing Resilience Social Support Transportation  ADL's:  Intact  Cognition: WNL  Sleep:         Has this patient used any form of tobacco in the last 30 days? (Cigarettes, Smokeless Tobacco, Cigars, and/or Pipes) Yes, No  Metabolic Disorder Labs:  Lab Results  Component Value Date   HGBA1C 7.9* 11/05/2011   MPG 180* 11/05/2011   No results found for: PROLACTIN Lab Results  Component Value Date   CHOL 103 11/06/2011   TRIG 55 11/06/2011   HDL 53 11/06/2011   CHOLHDL 1.9 11/06/2011   VLDL 11 11/06/2011   LDLCALC 39 11/06/2011   Discharge destination:  Home  Is patient on multiple antipsychotic therapies at discharge:  No   Has Patient had three or more failed trials of antipsychotic monotherapy by history:  No  Recommended Plan for Multiple Antipsychotic Therapies: NA  Discharge Instructions    Discharge instructions    Complete by:  As directed   Please make an appointment to follow up with [facility] identified as your Primary Care as soon as possible to  address your Diabetes, other chronic medical problems, and for skin rash to buttocks. During discussion the patient reported that his sister assisted him with making follow up appointments for medical care. Also please refrain from alcohol use after discharge as discussed for many reasons including poor diet related to excessive consumption with recent electrolyte abnormalities.            Medication List    TAKE these medications      Indication   acetaminophen 325 MG tablet  Commonly known as:  TYLENOL  Take 2 tablets (650 mg total) by mouth every 6 (six) hours as needed for mild pain.   Indication:  Fever, Reaction due to an Allergy, Pain     amLODipine 5 MG tablet  Commonly known as:  NORVASC  Take 1 tablet (5 mg total) by mouth daily.   Indication:  High Blood Pressure     aspirin EC 81 MG tablet  Take 1 tablet (81 mg total) by mouth daily.   Indication:  Inflammation, Mild to Moderate Pain, Heart Attack     CARAFATE 1 GM/10ML suspension  Generic drug:  sucralfate  Take 10 mLs (1 g total) by mouth 4 (four) times daily. Take 10 mls by mouth 4 times daily one hour before meals   Indication:  Gastroesophageal Reflux Disease, Ulcer due to Stress     carvedilol 12.5 MG tablet  Commonly known as:  COREG  Take 1 tablet (12.5 mg total) by mouth 2 (two) times daily.   Indication:  High Blood Pressure of Unknown Cause     clotrimazole-betamethasone cream  Commonly known as:  LOTRISONE  Apply 1 application topically daily as needed.   Indication:  Skin Infection due to Candida Yeast     DEXILANT 60 MG capsule  Generic drug:  dexlansoprazole  Take 1 capsule (60 mg total) by mouth every morning.   Indication:  Gastroesophageal Reflux Disease     escitalopram 10 MG tablet  Commonly known as:  LEXAPRO  Take 1 tablet (10 mg total) by mouth daily.   Indication:  Major Depressive Disorder     Fish Oil  1200 MG Caps  Take 1 capsule (1,200 mg total) by mouth daily.   Indication:   Disease involving Cholesterol Deposits in the Arteries, Inflammation     fluticasone 50 MCG/ACT nasal spray  Commonly known as:  FLONASE  Place 2 sprays into both nostrils daily.   Indication:  Allergic Rhinitis     hydroxypropyl methylcellulose / hypromellose 2.5 % ophthalmic solution  Commonly known as:  ISOPTO TEARS / GONIOVISC  Place 1 drop into both eyes 4 (four) times daily as needed for dry eyes.      insulin detemir 100 UNIT/ML injection  Commonly known as:  LEVEMIR  Inject 0.4 mLs (40 Units total) into the skin 1 day or 1 dose. Before breakfast   Indication:  Type 2 Diabetes     Iron (Ferrous Gluconate) 256 (28 Fe) MG Tabs  Take 65 mg by mouth daily.   Indication:  Iron Deficiency     loratadine 10 MG tablet  Commonly known as:  CLARITIN  Take 10 mg by mouth daily as needed for allergies or itching.      losartan 50 MG tablet  Commonly known as:  COZAAR  Take 1 tablet (50 mg total) by mouth daily.   Indication:  High Blood Pressure     metFORMIN 500 MG tablet  Commonly known as:  GLUCOPHAGE  Take 2 tablets (1,000 mg total) by mouth 2 (two) times daily with a meal.   Indication:  Type 2 Diabetes     metoCLOPramide 5 MG tablet  Commonly known as:  REGLAN  Take 1 tablet (5 mg total) by mouth 3 (three) times daily before meals.   Indication:  Diabetic Gastroparesis     multivitamin with minerals Tabs tablet  Take 1 tablet by mouth daily.   Indication:  Vitamin Supplementation     naproxen 500 MG tablet  Commonly known as:  NAPROSYN  Take 500 mg by mouth 2 (two) times daily as needed for moderate pain.      ondansetron 4 MG tablet  Commonly known as:  ZOFRAN  Take 4 mg by mouth every 8 (eight) hours as needed for nausea or vomiting.            Follow-up Information    Follow up with [facility]. Call today.   Why:  Follow up care   Contact information:   [address], [place] [phone]      Follow-up recommendations:   As above    Comments:   Take all your medications as prescribed by your mental healthcare provider.  Report any adverse effects and or reactions from your medicines to your outpatient provider promptly.  Patient is instructed and cautioned to not engage in alcohol and or illegal drug use while on prescription medicines.  In the event of worsening symptoms, patient is instructed to call the crisis hotline, 911 and or go to the nearest ED for appropriate evaluation and treatment of symptoms.  Follow-up with your primary care provider for your other medical issues, concerns and or health care needs.   SignedFransisca Kaufmann, NP-C 11/28/2015, 10:47 AM   Agree with NP Note as above

## 2015-12-02 ENCOUNTER — Inpatient Hospital Stay (HOSPITAL_COMMUNITY)
Admission: EM | Admit: 2015-12-02 | Discharge: 2015-12-05 | DRG: 305 | Disposition: A | Discharge status: Home / Self Care | Payer: Medicare Other | Attending: Internal Medicine | Admitting: Internal Medicine

## 2015-12-02 ENCOUNTER — Encounter (HOSPITAL_COMMUNITY): Payer: Self-pay | Admitting: Emergency Medicine

## 2015-12-02 ENCOUNTER — Emergency Department (HOSPITAL_COMMUNITY): Payer: Medicare Other

## 2015-12-02 DIAGNOSIS — K219 Gastro-esophageal reflux disease without esophagitis: Secondary | ICD-10-CM | POA: Diagnosis present

## 2015-12-02 DIAGNOSIS — I2510 Atherosclerotic heart disease of native coronary artery without angina pectoris: Secondary | ICD-10-CM | POA: Diagnosis present

## 2015-12-02 DIAGNOSIS — E1165 Type 2 diabetes mellitus with hyperglycemia: Secondary | ICD-10-CM | POA: Diagnosis present

## 2015-12-02 DIAGNOSIS — Z7982 Long term (current) use of aspirin: Secondary | ICD-10-CM

## 2015-12-02 DIAGNOSIS — F10239 Alcohol dependence with withdrawal, unspecified: Secondary | ICD-10-CM | POA: Diagnosis present

## 2015-12-02 DIAGNOSIS — Z720 Tobacco use: Secondary | ICD-10-CM | POA: Diagnosis present

## 2015-12-02 DIAGNOSIS — Z7984 Long term (current) use of oral hypoglycemic drugs: Secondary | ICD-10-CM | POA: Diagnosis not present

## 2015-12-02 DIAGNOSIS — N179 Acute kidney failure, unspecified: Secondary | ICD-10-CM

## 2015-12-02 DIAGNOSIS — E860 Dehydration: Secondary | ICD-10-CM | POA: Diagnosis present

## 2015-12-02 DIAGNOSIS — Z794 Long term (current) use of insulin: Secondary | ICD-10-CM | POA: Diagnosis not present

## 2015-12-02 DIAGNOSIS — I119 Hypertensive heart disease without heart failure: Secondary | ICD-10-CM | POA: Diagnosis present

## 2015-12-02 DIAGNOSIS — R079 Chest pain, unspecified: Secondary | ICD-10-CM | POA: Diagnosis present

## 2015-12-02 DIAGNOSIS — F17210 Nicotine dependence, cigarettes, uncomplicated: Secondary | ICD-10-CM | POA: Diagnosis present

## 2015-12-02 DIAGNOSIS — E785 Hyperlipidemia, unspecified: Secondary | ICD-10-CM | POA: Diagnosis present

## 2015-12-02 DIAGNOSIS — Z823 Family history of stroke: Secondary | ICD-10-CM

## 2015-12-02 DIAGNOSIS — I700 Atherosclerosis of aorta: Secondary | ICD-10-CM | POA: Diagnosis present

## 2015-12-02 DIAGNOSIS — Z8249 Family history of ischemic heart disease and other diseases of the circulatory system: Secondary | ICD-10-CM

## 2015-12-02 DIAGNOSIS — Z951 Presence of aortocoronary bypass graft: Secondary | ICD-10-CM

## 2015-12-02 DIAGNOSIS — Z811 Family history of alcohol abuse and dependence: Secondary | ICD-10-CM

## 2015-12-02 DIAGNOSIS — I6521 Occlusion and stenosis of right carotid artery: Secondary | ICD-10-CM | POA: Diagnosis present

## 2015-12-02 DIAGNOSIS — E1111 Type 2 diabetes mellitus with ketoacidosis with coma: Secondary | ICD-10-CM

## 2015-12-02 DIAGNOSIS — I10 Essential (primary) hypertension: Secondary | ICD-10-CM | POA: Diagnosis not present

## 2015-12-02 DIAGNOSIS — Z955 Presence of coronary angioplasty implant and graft: Secondary | ICD-10-CM | POA: Diagnosis not present

## 2015-12-02 DIAGNOSIS — I25110 Atherosclerotic heart disease of native coronary artery with unstable angina pectoris: Secondary | ICD-10-CM | POA: Diagnosis present

## 2015-12-02 DIAGNOSIS — I252 Old myocardial infarction: Secondary | ICD-10-CM | POA: Diagnosis not present

## 2015-12-02 DIAGNOSIS — I25119 Atherosclerotic heart disease of native coronary artery with unspecified angina pectoris: Secondary | ICD-10-CM | POA: Diagnosis not present

## 2015-12-02 DIAGNOSIS — F1910 Other psychoactive substance abuse, uncomplicated: Secondary | ICD-10-CM | POA: Diagnosis not present

## 2015-12-02 DIAGNOSIS — K2970 Gastritis, unspecified, without bleeding: Secondary | ICD-10-CM | POA: Diagnosis present

## 2015-12-02 DIAGNOSIS — R072 Precordial pain: Secondary | ICD-10-CM | POA: Diagnosis not present

## 2015-12-02 DIAGNOSIS — E876 Hypokalemia: Secondary | ICD-10-CM | POA: Diagnosis present

## 2015-12-02 DIAGNOSIS — I779 Disorder of arteries and arterioles, unspecified: Secondary | ICD-10-CM | POA: Diagnosis not present

## 2015-12-02 HISTORY — DX: Essential (primary) hypertension: I10

## 2015-12-02 HISTORY — DX: Hyperlipidemia, unspecified: E78.5

## 2015-12-02 HISTORY — DX: Heart failure, unspecified: I50.9

## 2015-12-02 HISTORY — DX: Type 2 diabetes mellitus without complications: E11.9

## 2015-12-02 HISTORY — DX: Atherosclerotic heart disease of native coronary artery without angina pectoris: I25.10

## 2015-12-02 LAB — BASIC METABOLIC PANEL
ANION GAP: 12 (ref 5–15)
BUN: 10 mg/dL (ref 6–20)
CALCIUM: 8.1 mg/dL — AB (ref 8.9–10.3)
CO2: 28 mmol/L (ref 22–32)
Chloride: 90 mmol/L — ABNORMAL LOW (ref 101–111)
Creatinine, Ser: 1.37 mg/dL — ABNORMAL HIGH (ref 0.61–1.24)
GFR calc non Af Amer: 51 mL/min — ABNORMAL LOW (ref >60)
GFR, EST AFRICAN AMERICAN: 59 mL/min — AB (ref >60)
GLUCOSE: 351 mg/dL — AB (ref 65–99)
POTASSIUM: 3.1 mmol/L — AB (ref 3.5–5.1)
Sodium: 130 mmol/L — ABNORMAL LOW (ref 135–145)

## 2015-12-02 LAB — PROTIME-INR
INR: 0.98 (ref 0.00–1.49)
Prothrombin Time: 13.2 seconds (ref 11.6–15.2)

## 2015-12-02 LAB — GLUCOSE, CAPILLARY
Glucose-Capillary: 242 mg/dL — ABNORMAL HIGH (ref 65–99)
Glucose-Capillary: 121 mg/dL — ABNORMAL HIGH (ref 65–99)

## 2015-12-02 LAB — CBC
HCT: 35.1 % — ABNORMAL LOW (ref 39.0–52.0)
HEMOGLOBIN: 12.3 g/dL — AB (ref 13.0–17.0)
MCH: 28.4 pg (ref 26.0–34.0)
MCHC: 35 g/dL (ref 30.0–36.0)
MCV: 81.1 fL (ref 78.0–100.0)
Platelets: 204 10*3/uL (ref 150–400)
RBC: 4.33 MIL/uL (ref 4.22–5.81)
RDW: 15.8 % — AB (ref 11.5–15.5)
WBC: 7.9 10*3/uL (ref 4.0–10.5)

## 2015-12-02 LAB — HEPATIC FUNCTION PANEL
ALBUMIN: 3 g/dL — AB (ref 3.5–5.0)
ALT: 14 U/L — AB (ref 17–63)
AST: 20 U/L (ref 15–41)
Alkaline Phosphatase: 134 U/L — ABNORMAL HIGH (ref 38–126)
BILIRUBIN DIRECT: 0.1 mg/dL (ref 0.1–0.5)
BILIRUBIN TOTAL: 0.7 mg/dL (ref 0.3–1.2)
Indirect Bilirubin: 0.6 mg/dL (ref 0.3–0.9)
Total Protein: 5.5 g/dL — ABNORMAL LOW (ref 6.5–8.1)

## 2015-12-02 LAB — APTT: aPTT: 26 seconds (ref 24–37)

## 2015-12-02 LAB — TROPONIN I
TROPONIN I: 0.04 ng/mL — AB (ref <0.031)
TROPONIN I: 0.04 ng/mL — AB (ref <0.031)
Troponin I: 0.04 ng/mL — ABNORMAL HIGH (ref <0.031)

## 2015-12-02 LAB — LIPASE, BLOOD: Lipase: 13 U/L (ref 11–51)

## 2015-12-02 MED ORDER — SIMVASTATIN 20 MG PO TABS
20.0000 mg | ORAL_TABLET | Freq: Every day | ORAL | Status: DC
  Administered 2015-12-02 – 2015-12-04 (×3): 20 mg via ORAL
  Filled 2015-12-02 (×3): qty 1

## 2015-12-02 MED ORDER — ENOXAPARIN SODIUM 40 MG/0.4ML ~~LOC~~ SOLN
40.0000 mg | SUBCUTANEOUS | Status: DC
  Administered 2015-12-02 – 2015-12-04 (×3): 40 mg via SUBCUTANEOUS
  Filled 2015-12-02 (×4): qty 0.4

## 2015-12-02 MED ORDER — MORPHINE SULFATE (PF) 2 MG/ML IV SOLN
4.0000 mg | Freq: Once | INTRAVENOUS | Status: AC
Start: 2015-12-02 — End: 2015-12-02
  Administered 2015-12-02: 4 mg via INTRAVENOUS
  Filled 2015-12-02: qty 2

## 2015-12-02 MED ORDER — ASPIRIN 325 MG PO TABS
325.0000 mg | ORAL_TABLET | Freq: Every day | ORAL | Status: DC
  Administered 2015-12-03 – 2015-12-05 (×3): 325 mg via ORAL
  Filled 2015-12-02 (×3): qty 1

## 2015-12-02 MED ORDER — PANTOPRAZOLE SODIUM 40 MG PO TBEC
40.0000 mg | DELAYED_RELEASE_TABLET | Freq: Two times a day (BID) | ORAL | Status: DC
  Administered 2015-12-02 – 2015-12-05 (×6): 40 mg via ORAL
  Filled 2015-12-02 (×6): qty 1

## 2015-12-02 MED ORDER — ESCITALOPRAM OXALATE 10 MG PO TABS
10.0000 mg | ORAL_TABLET | Freq: Every day | ORAL | Status: DC
  Administered 2015-12-02 – 2015-12-05 (×4): 10 mg via ORAL
  Filled 2015-12-02 (×4): qty 1

## 2015-12-02 MED ORDER — ASPIRIN 81 MG PO CHEW
324.0000 mg | CHEWABLE_TABLET | Freq: Once | ORAL | Status: AC
  Administered 2015-12-02: 324 mg via ORAL
  Filled 2015-12-02: qty 4

## 2015-12-02 MED ORDER — MORPHINE SULFATE (PF) 4 MG/ML IV SOLN
4.0000 mg | Freq: Once | INTRAVENOUS | Status: AC
  Administered 2015-12-02: 4 mg via INTRAVENOUS
  Filled 2015-12-02: qty 1

## 2015-12-02 MED ORDER — ADULT MULTIVITAMIN W/MINERALS CH
1.0000 | ORAL_TABLET | Freq: Every day | ORAL | Status: DC
  Administered 2015-12-02 – 2015-12-05 (×4): 1 via ORAL
  Filled 2015-12-02 (×4): qty 1

## 2015-12-02 MED ORDER — NITROGLYCERIN 0.4 MG SL SUBL: 0.4000 mg | SUBLINGUAL_TABLET | SUBLINGUAL | Status: DC | PRN

## 2015-12-02 MED ORDER — LORAZEPAM 1 MG PO TABS: 0.0000 mg | ORAL_TABLET | Freq: Two times a day (BID) | ORAL | Status: DC

## 2015-12-02 MED ORDER — LORAZEPAM 1 MG PO TABS
1.0000 mg | ORAL_TABLET | Freq: Four times a day (QID) | ORAL | Status: DC | PRN
  Administered 2015-12-04: 1 mg via ORAL
  Filled 2015-12-02: qty 1

## 2015-12-02 MED ORDER — SODIUM CHLORIDE 0.9 % IV SOLN
INTRAVENOUS | Status: DC
  Administered 2015-12-02 – 2015-12-03 (×2): 1000 mL via INTRAVENOUS
  Administered 2015-12-03 – 2015-12-04 (×2): via INTRAVENOUS

## 2015-12-02 MED ORDER — ACETAMINOPHEN 325 MG PO TABS: 650.0000 mg | ORAL_TABLET | Freq: Four times a day (QID) | ORAL | Status: DC | PRN

## 2015-12-02 MED ORDER — METOCLOPRAMIDE HCL 10 MG PO TABS
5.0000 mg | ORAL_TABLET | Freq: Three times a day (TID) | ORAL | Status: DC
  Administered 2015-12-02 – 2015-12-05 (×8): 5 mg via ORAL
  Filled 2015-12-02 (×8): qty 1

## 2015-12-02 MED ORDER — INSULIN ASPART 100 UNIT/ML ~~LOC~~ SOLN
0.0000 [IU] | SUBCUTANEOUS | Status: DC
  Administered 2015-12-02: 1 [IU] via SUBCUTANEOUS
  Administered 2015-12-02: 3 [IU] via SUBCUTANEOUS
  Administered 2015-12-03: 2 [IU] via SUBCUTANEOUS
  Administered 2015-12-03: 3 [IU] via SUBCUTANEOUS
  Administered 2015-12-04: 2 [IU] via SUBCUTANEOUS
  Administered 2015-12-04: 1 [IU] via SUBCUTANEOUS
  Administered 2015-12-04: 2 [IU] via SUBCUTANEOUS
  Administered 2015-12-04 – 2015-12-05 (×2): 3 [IU] via SUBCUTANEOUS

## 2015-12-02 MED ORDER — CARVEDILOL 12.5 MG PO TABS
12.5000 mg | ORAL_TABLET | Freq: Two times a day (BID) | ORAL | Status: DC
  Administered 2015-12-02 – 2015-12-05 (×6): 12.5 mg via ORAL
  Filled 2015-12-02 (×6): qty 1

## 2015-12-02 MED ORDER — THIAMINE HCL 100 MG/ML IJ SOLN
100.0000 mg | Freq: Every day | INTRAMUSCULAR | Status: DC
  Administered 2015-12-04: 100 mg via INTRAVENOUS
  Filled 2015-12-02 (×2): qty 2

## 2015-12-02 MED ORDER — POLYVINYL ALCOHOL 1.4 % OP SOLN: 1.0000 [drp] | Freq: Four times a day (QID) | OPHTHALMIC | Status: DC | PRN

## 2015-12-02 MED ORDER — LORAZEPAM 1 MG PO TABS
0.0000 mg | ORAL_TABLET | Freq: Four times a day (QID) | ORAL | Status: AC
  Administered 2015-12-02: 1 mg via ORAL
  Filled 2015-12-02: qty 1

## 2015-12-02 MED ORDER — POTASSIUM CHLORIDE CRYS ER 20 MEQ PO TBCR
40.0000 meq | EXTENDED_RELEASE_TABLET | Freq: Two times a day (BID) | ORAL | Status: DC
  Administered 2015-12-02 – 2015-12-05 (×6): 40 meq via ORAL
  Filled 2015-12-02 (×6): qty 2

## 2015-12-02 MED ORDER — FOLIC ACID 1 MG PO TABS
1.0000 mg | ORAL_TABLET | Freq: Every day | ORAL | Status: DC
  Administered 2015-12-02 – 2015-12-05 (×4): 1 mg via ORAL
  Filled 2015-12-02 (×4): qty 1

## 2015-12-02 MED ORDER — LORAZEPAM 2 MG/ML IJ SOLN: 1.0000 mg | Freq: Four times a day (QID) | INTRAMUSCULAR | Status: DC | PRN

## 2015-12-02 MED ORDER — AMLODIPINE BESYLATE 5 MG PO TABS
5.0000 mg | ORAL_TABLET | Freq: Every day | ORAL | Status: DC
  Administered 2015-12-02 – 2015-12-05 (×4): 5 mg via ORAL
  Filled 2015-12-02 (×5): qty 1

## 2015-12-02 MED ORDER — SODIUM CHLORIDE 0.9 % IJ SOLN
3.0000 mL | Freq: Two times a day (BID) | INTRAMUSCULAR | Status: DC
  Administered 2015-12-03 – 2015-12-04 (×3): 3 mL via INTRAVENOUS

## 2015-12-02 MED ORDER — SUCRALFATE 1 GM/10ML PO SUSP
1.0000 g | Freq: Four times a day (QID) | ORAL | Status: DC
  Administered 2015-12-02 – 2015-12-05 (×11): 1 g via ORAL
  Filled 2015-12-02 (×11): qty 10

## 2015-12-02 MED ORDER — MORPHINE SULFATE (PF) 2 MG/ML IV SOLN
2.0000 mg | INTRAVENOUS | Status: DC | PRN
  Administered 2015-12-02 – 2015-12-05 (×12): 2 mg via INTRAVENOUS
  Filled 2015-12-02 (×12): qty 1

## 2015-12-02 MED ORDER — ACETAMINOPHEN 650 MG RE SUPP: 650.0000 mg | Freq: Four times a day (QID) | RECTAL | Status: DC | PRN

## 2015-12-02 MED ORDER — NITROGLYCERIN 0.4 MG SL SUBL
0.4000 mg | SUBLINGUAL_TABLET | SUBLINGUAL | Status: DC | PRN
Start: 2015-12-02 — End: 2015-12-05
  Administered 2015-12-03 – 2015-12-04 (×3): 0.4 mg via SUBLINGUAL
  Filled 2015-12-02 (×2): qty 1

## 2015-12-02 MED ORDER — LOSARTAN POTASSIUM 50 MG PO TABS
50.0000 mg | ORAL_TABLET | Freq: Every day | ORAL | Status: DC
  Administered 2015-12-02 – 2015-12-05 (×4): 50 mg via ORAL
  Filled 2015-12-02 (×4): qty 1

## 2015-12-02 MED ORDER — SODIUM CHLORIDE 0.9 % IV SOLN
1000.0000 mL | INTRAVENOUS | Status: DC
  Administered 2015-12-02 – 2015-12-04 (×2): 1000 mL via INTRAVENOUS

## 2015-12-02 MED ORDER — ADULT MULTIVITAMIN W/MINERALS CH: 1.0000 | ORAL_TABLET | Freq: Every day | ORAL | Status: DC

## 2015-12-02 MED ORDER — VITAMIN B-1 100 MG PO TABS
100.0000 mg | ORAL_TABLET | Freq: Every day | ORAL | Status: DC
  Administered 2015-12-02 – 2015-12-05 (×3): 100 mg via ORAL
  Filled 2015-12-02 (×4): qty 1

## 2015-12-02 NOTE — H&P (Signed)
[facility] History and Physical  Aaron Santiago:[id number] DOB: 1946-11-25 DOA: 12/02/2015  Referring physician:  PCP: Aaron Hector, MD  Specialists:   Chief Complaint: chest pains   HPI: Aaron Santiago is a 70 y.o. male with PMH of HTN, DM, CAD h/op CABG, s/p PTCA, Substance abuse, who recently discharged from behavioral health  Presented with substernal intermittent chest pains for 2-3 days. He denies exertional pains, he tried NTG without significant relief. He also had 2 episode of vomiting. He denies SOB, no cough. He report previous h/o similar symptoms, but not severe.   Review of Systems: The patient denies anorexia, fever, weight loss,, vision loss, decreased hearing, hoarseness, chest pain, syncope, dyspnea on exertion, peripheral edema, balance deficits, hemoptysis, abdominal pain, melena, hematochezia, severe indigestion/heartburn, hematuria, incontinence, genital sores, muscle weakness, suspicious skin lesions, transient blindness, difficulty walking, depression, unusual weight change, abnormal bleeding, enlarged lymph nodes, angioedema, and breast masses.    Past Medical History  Diagnosis Date  . S/P CABG (coronary artery bypass graft) 2002    LIMA to LAD  . Tobacco abuse current    1/2 ppd smoker  . History of substance abuse     Alcohol, quit 1999.  Cocaine, quit late 1980's.  . Peptic ulcer disease   . Diabetes mellitus   . Stomach ulcer 1972    h/o antrectomy  . GERD (gastroesophageal reflux disease)   . Arthritis   . Carotid artery stenosis 10/2011    60-79% Right ICA stenosis  . High cholesterol   . Hypertension   . Coronary artery disease   . Seasonal allergies    Past Surgical History  Procedure Laterality Date  . Antrectomy  [id number]  . Cholecystectomy  06/2010  . Fracture surgery    . Femur fracture surgery  ~ 1964    left  . Coronary artery bypass graft  2002    CABG X1  . Coronary angioplasty with stent placement  11/06/11    2  DES to mid LCX and proximal RCA. 60% LAD stenosis after LIMA anastomosis  . Knee arthroscopy with medial menisectomy Right 06/06/2013    Procedure: RIGHT KNEE ARTHROSCOPY WITH PARTIAL MEDIAL MENISCECTOMY;  Surgeon: Budd Palmer, MD;  Location: [facility] OR;  Service: Orthopedics;  Laterality: Right;  . Left heart catheterization with coronary angiogram N/A 11/06/2011    Procedure: LEFT HEART CATHETERIZATION WITH CORONARY ANGIOGRAM;  Surgeon: Iran Ouch, MD;  Location: [facility] CATH LAB;  Service: Cardiovascular;  Laterality: N/A;  . Percutaneous coronary stent intervention (pci-s) N/A 11/06/2011    Procedure: PERCUTANEOUS CORONARY STENT INTERVENTION (PCI-S);  Surgeon: Iran Ouch, MD;  Location: [facility] CATH LAB;  Service: Cardiovascular;  Laterality: N/A;   Social History:  reports that he has been smoking Cigarettes.  He has a 27.5 pack-year smoking history. He has never used smokeless tobacco. He reports that he drinks alcohol. He reports that he uses illicit drugs (Hydrocodone, Marijuana, and Cocaine). Home;  where does patient live--home, ALF, SNF? and with whom if at home? Yes;  Can patient participate in ADLs?  No Known Allergies  Family History  Problem Relation Age of Onset  . Stroke      grandparent  . Hypertension Mother     alive age 5 with pacemaker  . Coronary artery disease      grandparent  . Alcohol abuse Father     died in DWI accident when patient was a child    (be sure to complete)  Prior  to Admission medications   Medication Sig Start Date End Date Taking? Authorizing Provider  acetaminophen (TYLENOL) 325 MG tablet Take 2 tablets (650 mg total) by mouth every 6 (six) hours as needed for mild pain. 11/27/15  Yes Thermon Leyland, NP  amLODipine (NORVASC) 5 MG tablet Take 1 tablet (5 mg total) by mouth daily. 11/27/15  Yes Thermon Leyland, NP  aspirin EC 81 MG tablet Take 1 tablet (81 mg total) by mouth daily. 11/27/15  Yes Thermon Leyland, NP  CARAFATE 1 GM/10ML suspension Take  10 mLs (1 g total) by mouth 4 (four) times daily. Take 10 mls by mouth 4 times daily one hour before meals 11/27/15  Yes Thermon Leyland, NP  carvedilol (COREG) 12.5 MG tablet Take 1 tablet (12.5 mg total) by mouth 2 (two) times daily. 11/27/15  Yes Thermon Leyland, NP  DEXILANT 60 MG capsule Take 1 capsule (60 mg total) by mouth every morning. 11/27/15  Yes Thermon Leyland, NP  escitalopram (LEXAPRO) 10 MG tablet Take 1 tablet (10 mg total) by mouth daily. 11/27/15  Yes Thermon Leyland, NP  hydroxypropyl methylcellulose (ISOPTO TEARS) 2.5 % ophthalmic solution Place 1 drop into both eyes 4 (four) times daily as needed for dry eyes.    Yes Historical Provider, MD  insulin detemir (LEVEMIR) 100 UNIT/ML injection Inject 0.4 mLs (40 Units total) into the skin 1 day or 1 dose. Before breakfast 11/27/15  Yes Thermon Leyland, NP  Iron, Ferrous Gluconate, 256 (28 Fe) MG TABS Take 65 mg by mouth daily. 11/27/15  Yes Thermon Leyland, NP  loratadine (CLARITIN) 10 MG tablet Take 10 mg by mouth daily as needed for allergies or itching.    Yes Historical Provider, MD  losartan (COZAAR) 50 MG tablet Take 1 tablet (50 mg total) by mouth daily. 11/27/15  Yes Thermon Leyland, NP  metFORMIN (GLUCOPHAGE) 500 MG tablet Take 2 tablets (1,000 mg total) by mouth 2 (two) times daily with a meal. 11/27/15  Yes Thermon Leyland, NP  metoCLOPramide (REGLAN) 5 MG tablet Take 1 tablet (5 mg total) by mouth 3 (three) times daily before meals. 11/27/15  Yes Thermon Leyland, NP  Multiple Vitamin (MULTIVITAMIN WITH MINERALS) TABS tablet Take 1 tablet by mouth daily. 11/27/15  Yes Thermon Leyland, NP  naproxen (NAPROSYN) 500 MG tablet Take 500 mg by mouth 2 (two) times daily as needed for moderate pain.   Yes Historical Provider, MD  Omega-3 Fatty Acids (FISH OIL) 1200 MG CAPS Take 1 capsule (1,200 mg total) by mouth daily. 11/27/15  Yes Thermon Leyland, NP  ondansetron (ZOFRAN) 4 MG tablet Take 4 mg by mouth every 8 (eight) hours as needed for nausea or  vomiting.   Yes Historical Provider, MD   Physical Exam: Filed Vitals:   12/02/15 1330 12/02/15 1400  BP: 140/79 122/87  Pulse: 114 116  Temp:    Resp: 15 12     General:  Alert. Oriented   Eyes: eom-i, perrla   ENT: no oral ulcers   Neck: supple, no JVD  Cardiovascular: s1,s2 tachycardia   Respiratory: CTA BL  Abdomen: soft, mild epigastric tender   Skin: no rash   Musculoskeletal: no leg edema   Psychiatric: no hallucinations   Neurologic: CN 2-12 intact. Motor 5/5 BL  Labs on Admission:  Basic Metabolic Panel:  Recent Labs Lab 11/25/15 1820 12/02/15 1207  NA 136 130*  K 3.3* 3.1*  CL 95* 90*  CO2 29 28  GLUCOSE 225* 351*  BUN 26* 10  CREATININE 0.87 1.37*  CALCIUM 8.7* 8.1*   Liver Function Tests:  Recent Labs Lab 12/02/15 1207  AST 20  ALT 14*  ALKPHOS 134*  BILITOT 0.7  PROT 5.5*  ALBUMIN 3.0*    Recent Labs Lab 12/02/15 1207  LIPASE 13   No results for input(s): AMMONIA in the last 168 hours. CBC:  Recent Labs Lab 12/02/15 1207  WBC 7.9  HGB 12.3*  HCT 35.1*  MCV 81.1  PLT 204   Cardiac Enzymes:  Recent Labs Lab 12/02/15 1207  TROPONINI 0.04*    BNP (last 3 results) No results for input(s): BNP in the last 8760 hours.  ProBNP (last 3 results) No results for input(s): PROBNP in the last 8760 hours.  CBG:  Recent Labs Lab 11/26/15 1135 11/26/15 1634 11/26/15 2122 11/27/15 0633 11/27/15 1109  GLUCAP 162* 207* 193* 230* 164*    Radiological Exams on Admission: Dg Chest 2 View  12/02/2015  CLINICAL DATA:  Chest pain. EXAM: CHEST  2 VIEW COMPARISON:  06/05/2013 FINDINGS: Heart size and pulmonary vascularity are normal and the lungs are clear. No effusions. Previous median sternotomy. Surgical clips at the GE junction, probably due to hiatal hernia repair. Calcification in the thoracic aorta. No significant osseous abnormality. IMPRESSION: No acute disease.  Aortic atherosclerosis. Electronically Signed   By:  Francene Boyers M.D.   On: 12/02/2015 12:02    EKG: Independently reviewed.   Assessment/Plan Active Problems:   Chest pain   CAD (coronary artery disease)  70 y.o. male with PMH of HTN, DM, CAD h/o CABG, s/p PTCA, Substance abuse, who recently discharged from behavioral health  Presented with substernal intermittent chest pains  1. Chest pains. R/o ACS. Initial trop 0.04. ECG sinus tachycardia. H/o CAD h/op CABG, s/p PTCA -we will start ASA, statin, NTG, BB.  Monitor serial trop, ECG. Consult cardiology. Check urine Tox   2. AKI, dehydration, hypokalemia. Start IVF, Replace electrolytes. monitor  3. Probable gastritis. Start PPI. May need endoscopy evaluation. No s/s of acute bleeding  4. DM. Uncontrolled. Patient is on insulin levemir 40U. Non adherance. Check ha1c. Cont iSS while NPO. Restart levemir when able to take PO 5. Substance abuse (h/o opioids, cocaine, etoh). Patient denies using drug recently. Cont monitor on CIWA for etoh withdrawals  6. HTN. Cont BB, amlodipine. Monitor    Cardiology;  if consultant consulted, please document name and whether formally or informally consulted  Code Status: full (must indicate code status--if unknown or must be presumed, indicate so) Family Communication: d/w patient, his sister, family friend  (indicate person spoken with, if applicable, with phone number if by telephone) Disposition Plan: home when ready  (indicate anticipated LOS)  Time spent: >45 minutes   Esperanza Sheets [facility] Pager [phone]  If 7PM-7AM, please contact night-coverage [email or web address] Password [email or web address] 12/02/2015, 2:35 PM

## 2015-12-02 NOTE — ED Notes (Signed)
Patient arrives via EMS from home with c/o central chest pain that started today. Patient seen last week for ETOH abuse that caused AMS. Patient arrives alert, states last drink was 5 days ago.
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
## 2015-12-02 NOTE — ED Notes (Signed)
Admitting MD at bedside.

## 2015-12-03 ENCOUNTER — Encounter (HOSPITAL_COMMUNITY): Payer: Self-pay | Admitting: Cardiology

## 2015-12-03 DIAGNOSIS — I779 Disorder of arteries and arterioles, unspecified: Secondary | ICD-10-CM

## 2015-12-03 DIAGNOSIS — I10 Essential (primary) hypertension: Secondary | ICD-10-CM

## 2015-12-03 DIAGNOSIS — I25119 Atherosclerotic heart disease of native coronary artery with unspecified angina pectoris: Secondary | ICD-10-CM

## 2015-12-03 DIAGNOSIS — F1910 Other psychoactive substance abuse, uncomplicated: Secondary | ICD-10-CM

## 2015-12-03 LAB — BASIC METABOLIC PANEL
Anion gap: 8 (ref 5–15)
BUN: 10 mg/dL (ref 6–20)
CO2: 29 mmol/L (ref 22–32)
Calcium: 8.1 mg/dL — ABNORMAL LOW (ref 8.9–10.3)
Chloride: 102 mmol/L (ref 101–111)
Creatinine, Ser: 1.15 mg/dL (ref 0.61–1.24)
GFR calc Af Amer: >60 mL/min (ref >60)
Glucose, Bld: 91 mg/dL (ref 65–99)
POTASSIUM: 3.4 mmol/L — AB (ref 3.5–5.1)
SODIUM: 139 mmol/L (ref 135–145)

## 2015-12-03 LAB — GLUCOSE, CAPILLARY
GLUCOSE-CAPILLARY: 88 mg/dL (ref 65–99)
GLUCOSE-CAPILLARY: 109 mg/dL — AB (ref 65–99)
GLUCOSE-CAPILLARY: 117 mg/dL — AB (ref 65–99)
Glucose-Capillary: 165 mg/dL — ABNORMAL HIGH (ref 65–99)
Glucose-Capillary: 197 mg/dL — ABNORMAL HIGH (ref 65–99)
Glucose-Capillary: 226 mg/dL — ABNORMAL HIGH (ref 65–99)

## 2015-12-03 LAB — RAPID URINE DRUG SCREEN, HOSP PERFORMED
Amphetamines: NOT DETECTED
BARBITURATES: NOT DETECTED
Benzodiazepines: POSITIVE — AB
COCAINE: NOT DETECTED
OPIATES: POSITIVE — AB
Tetrahydrocannabinol: NOT DETECTED

## 2015-12-03 LAB — HEMOGLOBIN A1C
Hgb A1c MFr Bld: 7.7 % — ABNORMAL HIGH (ref 4.8–5.6)
MEAN PLASMA GLUCOSE: 174 mg/dL

## 2015-12-03 LAB — TROPONIN I: Troponin I: 0.03 ng/mL (ref <0.031)

## 2015-12-03 MED ORDER — INSULIN GLARGINE 100 UNIT/ML ~~LOC~~ SOLN
20.0000 [IU] | Freq: Every day | SUBCUTANEOUS | Status: DC
  Administered 2015-12-03 – 2015-12-04 (×2): 20 [IU] via SUBCUTANEOUS
  Filled 2015-12-03 (×3): qty 0.2

## 2015-12-03 NOTE — Consult Note (Signed)
Requesting service: PTH Primary cardiologist: Dr. Rachel Moulds Consulting cardiologist: Dr. Jonelle Sidle  Reason for consultation: Chest pain  Clinical Summary Aaron Santiago is a 70 y.o.male with past medical history outlined below, currently admitted to the hospital with recent chest discomfort. Record review finds recent hospitalization with Behavioral Health for substance induced mood disorder. He states that over the last 3 weeks he has been experiencing intermittent "sharp" and also "pressure" discomfort in his chest, no specific precipitant, not necessarily worse with exertion.  Patient states that under alteration his chest discomfort has improved, still has mild symptoms at times. Troponin I levels have been overall reassuring from 0.04 down to 0.03. UDS positive for benzodiazepines and opiates, negative cocaine. 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  He follows with Dr. Kirke Corin in the [facility] office, also has bilateral carotid artery disease. Most recent evaluation from last year showed 60-79% RICA stenosis and 40-59% LICA stenosis. He had a negative [facility] [place] back in 2013.   No Known Allergies  Medications Scheduled Medications: . amLODipine  5 mg Oral Daily  . aspirin  325 mg Oral Daily  . carvedilol  12.5 mg Oral BID  . enoxaparin (LOVENOX) injection  40 mg Subcutaneous Q24H  . escitalopram  10 mg Oral Daily  . folic acid  1 mg Oral Daily  . insulin aspart  0-9 Units Subcutaneous 6 times per day  . insulin glargine  20 Units Subcutaneous QHS  . LORazepam  0-4 mg Oral Q6H   Followed by  . [START ON 12/04/2015] LORazepam  0-4 mg Oral Q12H  . losartan  50 mg Oral Daily  . metoCLOPramide  5 mg Oral TID AC  . multivitamin with minerals  1 tablet Oral Daily  . pantoprazole  40 mg Oral BID AC  . potassium chloride  40 mEq Oral BID  . simvastatin  20 mg Oral q1800  . sodium chloride  3 mL Intravenous  Q12H  . sucralfate  1 g Oral QID  . thiamine  100 mg Oral Daily   Or  . thiamine  100 mg Intravenous Daily     Infusions: . sodium chloride 1,000 mL (12/02/15 1310)  . sodium chloride 1,000 mL (12/03/15 1125)     PRN Medications:  acetaminophen **OR** acetaminophen, LORazepam **OR** LORazepam, morphine injection, nitroGLYCERIN, polyvinyl alcohol   Past Medical History  Diagnosis Date  . CAD (coronary artery disease)     LIMA to LAD 2002, DES to circumflex and RCA 2012  . History of substance abuse     Alcohol and cocaine  . Peptic ulcer disease   . Type 2 diabetes mellitus (HCC)   . Stomach ulcer 1972    Antrectomy  . GERD (gastroesophageal reflux disease)   . Arthritis   . Carotid artery stenosis     Moderate bilateral disease  . Hyperlipidemia   . Essential hypertension   . Seasonal allergies     Past Surgical History  Procedure Laterality Date  . Antrectomy  [id number]  . Cholecystectomy  06/2010  . Fracture surgery    . Femur fracture surgery  ~ 1964    left  . Coronary artery bypass graft  2002    CABG X1  . Coronary angioplasty with stent placement  11/06/11    2 DES to mid LCX and proximal RCA. 60% LAD stenosis after LIMA anastomosis  . Knee arthroscopy with medial menisectomy Right 06/06/2013  Procedure: RIGHT KNEE ARTHROSCOPY WITH PARTIAL MEDIAL MENISCECTOMY;  Surgeon: Budd Palmer, MD;  Location: [facility] OR;  Service: Orthopedics;  Laterality: Right;  . Left heart catheterization with coronary angiogram N/A 11/06/2011    Procedure: LEFT HEART CATHETERIZATION WITH CORONARY ANGIOGRAM;  Surgeon: Iran Ouch, MD;  Location: [facility] CATH LAB;  Service: Cardiovascular;  Laterality: N/A;  . Percutaneous coronary stent intervention (pci-s) N/A 11/06/2011    Procedure: PERCUTANEOUS CORONARY STENT INTERVENTION (PCI-S);  Surgeon: Iran Ouch, MD;  Location: [facility] CATH LAB;  Service: Cardiovascular;  Laterality: N/A;    Family History  Problem Relation Age of Onset  .  Hypertension Mother     Pacemaker  . Alcohol abuse Father     Died in DWI accident when patient was a child    Social History Aaron Santiago reports that he has been smoking Cigarettes.  He has a 27.5 pack-year smoking history. He has never used smokeless tobacco. Aaron Santiago reports that he drinks alcohol.  Review of Systems Complete review of systems negative except as otherwise outlined in the clinical summary and also the following. Generally feels fatigued. Has reflux symptoms.  Physical Examination Blood pressure 99/54, pulse 72, temperature 98.2 F (36.8 C), temperature source Oral, resp. rate 20, height 6' (1.829 m), weight 142 lb 14.4 oz (64.819 kg), SpO2 97 %.  Intake/Output Summary (Last 24 hours) at 12/03/15 1621 Last data filed at 12/03/15 1100  Gross per 24 hour  Intake    240 ml  Output    500 ml  Net   -260 ml   Telemetry: Sinus rhythm.  Gen.: Patient appears comfortable at rest, somewhat disheveled. HEENT: Conjunctiva and lids normal, oropharynx clear. Neck: Supple, no elevated JVP, right carotid bruit, no thyromegaly. Lungs: Clear to auscultation, nonlabored breathing at rest. Cardiac: Regular rate and rhythm, no S3 or significant systolic murmur, no pericardial rub. Abdomen: Soft, nontender,bowel sounds present, no guarding or rebound. Extremities: No pitting edema, distal pulses 2+. Skin: Warm and dry. Musculoskeletal: No kyphosis. Neuropsychiatric: Alert and oriented x3, affect grossly appropriate.  Lab Results  Basic Metabolic Panel:  Recent Labs Lab 12/02/15 1207 12/03/15 0431  NA 130* 139  K 3.1* 3.4*  CL 90* 102  CO2 28 29  GLUCOSE 351* 91  BUN 10 10  CREATININE 1.37* 1.15  CALCIUM 8.1* 8.1*    Liver Function Tests:  Recent Labs Lab 12/02/15 1207  AST 20  ALT 14*  ALKPHOS 134*  BILITOT 0.7  PROT 5.5*  ALBUMIN 3.0*    CBC:  Recent Labs Lab 12/02/15 1207  WBC 7.9  HGB 12.3*  HCT 35.1*  MCV 81.1  PLT 204    Cardiac  Enzymes:  Recent Labs Lab 12/02/15 1207 12/02/15 1623 12/02/15 2159 12/03/15 0431  TROPONINI 0.04* 0.04* 0.04* 0.03    Imaging Chest x-ray 12/02/2015: FINDINGS: Heart size and pulmonary vascularity are normal and the lungs are clear. No effusions. Previous median sternotomy. Surgical clips at the GE junction, probably due to hiatal hernia repair.  Calcification in the thoracic aorta. No significant osseous abnormality.  IMPRESSION: No acute disease. Aortic atherosclerosis.  Impression  1. 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 Symptoms have both typical and atypical features for angina.  2. Multivessel CAD status post single-vessel LIMA to LAD in 2002 with subsequent DES to the circumflex and RCA in 2012. Last formal ischemic testing was in  2013 based on record review.  3. Moderate bilateral carotid artery disease.  4. History of substance abuse as outlined above. Recent Behavioral Health admission due to substance related mood disorder. UDS positive for benzodiazepines and opioids.  5. Essential hypertension.  Recommendations  I reviewed his current cardiac regimen which includes aspirin, Coreg, Norvasc, Lovenox, Cozaar, and Zocor. Blood pressure recently low normal which limits further up titration of medical therapy, and his heart rate is in the 70s, he was in sinus tachycardia when he came in. With improvement in symptoms and no acute ST changes by ECG, he can likely be considered for a follow-up Myoview study as an outpatient. When he is ready for discharge, we will assist in getting him scheduled to follow-up with Dr. Kirke Corin in the [facility] office and have a Myoview proceeding that visit.  Jonelle Sidle, M.D., F.A.C.C.

## 2015-12-03 NOTE — Progress Notes (Signed)
[facility] PROGRESS NOTE  Aaron Santiago ZOX:[id number] DOB: [birth year] DOA: 12/02/2015 PCP: Josue Hector, MD  Assessment/Plan: 70 y.o. male with PMH of HTN, DM, CAD h/o CABG, s/p PTCA, Substance abuse, who recently discharged from behavioral health Presented with substernal intermittent chest pains  1. Chest pains.  ACS->ruled out. Trop-negative. ECG sinus tachycardia. Pt has h/o CAD h/o CABG, s/p PTCA -Patient reports significant improvement in chest pains overnight. cont ASA, statin, NTG, BB. awaiting cardiology evaluation   2. AKI, dehydration, hypokalemia. Renal function is improving on IVF, Replace electrolytes. monitor  3. Probable gastritis. Started PPI. No s/s of acute bleeding. May need GI follow up  4. DM. Uncontrolled on admission. Patient is on insulin levemir 40U. Non adherance.  -Restart luntus at 20U.+ISS. Monitor  5. Substance abuse (h/o opioids, cocaine, etoh). Patient denies using drug recently. Cont monitor on CIWA for etoh withdrawals  6. HTN. Cont BB, amlodipine. Monitor   Code Status: full Family Communication: d/w patient (indicate person spoken with, relationship, and if by phone, the number) Disposition Plan: home 24-48 hrs. awaiting cardiology evaluation    Consultants:  Cardiology   Procedures:  none  Antibiotics:  none (indicate start date, and stop date if known)  HPI/Subjective: Alert. No distress. Reports feeling better   Objective: Filed Vitals:   12/03/15 0358 12/03/15 0453  BP: 148/88 133/75  Pulse: 100 110  Temp:  98.1 F (36.7 C)  Resp:  20    Intake/Output Summary (Last 24 hours) at 12/03/15 1059 Last data filed at 12/02/15 2100  Gross per 24 hour  Intake      0 ml  Output    500 ml  Net   -500 ml   Filed Weights   12/02/15 1134 12/02/15 1555  Weight: 63.504 kg (140 lb) 64.819 kg (142 lb 14.4 oz)    Exam:   General:  No distress   Cardiovascular: s1,s2 rrr  Respiratory: CTA VL  Abdomen:  soft, nt,nd   Musculoskeletal: no leg edem a   Data Reviewed: Basic Metabolic Panel:  Recent Labs Lab 12/02/15 1207 12/03/15 0431  NA 130* 139  K 3.1* 3.4*  CL 90* 102  CO2 28 29  GLUCOSE 351* 91  BUN 10 10  CREATININE 1.37* 1.15  CALCIUM 8.1* 8.1*   Liver Function Tests:  Recent Labs Lab 12/02/15 1207  AST 20  ALT 14*  ALKPHOS 134*  BILITOT 0.7  PROT 5.5*  ALBUMIN 3.0*    Recent Labs Lab 12/02/15 1207  LIPASE 13   No results for input(s): AMMONIA in the last 168 hours. CBC:  Recent Labs Lab 12/02/15 1207  WBC 7.9  HGB 12.3*  HCT 35.1*  MCV 81.1  PLT 204   Cardiac Enzymes:  Recent Labs Lab 12/02/15 1207 12/02/15 1623 12/02/15 2159 12/03/15 0431  TROPONINI 0.04* 0.04* 0.04* 0.03   BNP (last 3 results) No results for input(s): BNP in the last 8760 hours.  ProBNP (last 3 results) No results for input(s): PROBNP in the last 8760 hours.  CBG:  Recent Labs Lab 12/02/15 1653 12/02/15 2059 12/03/15 0025 12/03/15 0453 12/03/15 0727  GLUCAP 242* 121* 165* 88 109*    No results found for this or any previous visit (from the past 240 hour(s)).   Studies: Dg Chest 2 View  12/02/2015  CLINICAL DATA:  Chest pain. EXAM: CHEST  2 VIEW COMPARISON:  06/05/2013 FINDINGS: Heart size and pulmonary vascularity are normal and the lungs are clear. No effusions. Previous median sternotomy.  Surgical clips at the GE junction, probably due to hiatal hernia repair. Calcification in the thoracic aorta. No significant osseous abnormality. IMPRESSION: No acute disease.  Aortic atherosclerosis. Electronically Signed   By: Francene Boyers M.D.   On: 12/02/2015 12:02    Scheduled Meds: . amLODipine  5 mg Oral Daily  . aspirin  325 mg Oral Daily  . carvedilol  12.5 mg Oral BID  . enoxaparin (LOVENOX) injection  40 mg Subcutaneous Q24H  . escitalopram  10 mg Oral Daily  . folic acid  1 mg Oral Daily  . insulin aspart  0-9 Units Subcutaneous 6 times per day  .  LORazepam  0-4 mg Oral Q6H   Followed by  . [START ON 12/04/2015] LORazepam  0-4 mg Oral Q12H  . losartan  50 mg Oral Daily  . metoCLOPramide  5 mg Oral TID AC  . multivitamin with minerals  1 tablet Oral Daily  . pantoprazole  40 mg Oral BID AC  . potassium chloride  40 mEq Oral BID  . simvastatin  20 mg Oral q1800  . sodium chloride  3 mL Intravenous Q12H  . sucralfate  1 g Oral QID  . thiamine  100 mg Oral Daily   Or  . thiamine  100 mg Intravenous Daily   Continuous Infusions: . sodium chloride 1,000 mL (12/02/15 1310)  . sodium chloride 1,000 mL (12/02/15 1641)    Active Problems:   Chest pain   CAD (coronary artery disease)    Time spent: >35 minutes     Esperanza Sheets  [facility] Pager [phone]. If 7PM-7AM, please contact night-coverage at [email or web address], password [email or web address] 12/03/2015, 10:59 AM  LOS: 1 day

## 2015-12-03 NOTE — Progress Notes (Signed)
12 Lead EKG completed on patient.  Paged Dr. York Spaniel and received no new orders.

## 2015-12-03 NOTE — Care Management Note (Signed)
Case Management Note  Patient Details  Name: BRODRICK FIELDHOUSE MRN: [id number] Date of Birth: [birth year]  Subjective/Objective:                  Pt admitted from home with CP. Pt lives alone and will return home at discharge. Pt is independent with ADL's.   Action/Plan: No CM needs noted.  Expected Discharge Date:                  Expected Discharge Plan:  Home/Self Care  In-House Referral:  NA  Discharge planning Services  CM Consult  Post Acute Care Choice:  NA Choice offered to:  NA  DME Arranged:    DME Agency:     HH Arranged:    HH Agency:     Status of Service:  Completed, signed off  Medicare Important Message Given:    Date Medicare IM Given:    Medicare IM give by:    Date Additional Medicare IM Given:    Additional Medicare Important Message give by:     If discussed at Long Length of Stay Meetings, dates discussed:    Additional Comments:  Cheryl Flash, RN 12/03/2015, 2:52 PM

## 2015-12-03 NOTE — Progress Notes (Signed)
Patient c/o chest pain, B/P 117/68, temp 98.2, hr 81,respiration 20 , room air saturation 96. Dr York Spaniel notified,will continue to monitor patient.Aaron Santiago

## 2015-12-04 DIAGNOSIS — R072 Precordial pain: Secondary | ICD-10-CM

## 2015-12-04 DIAGNOSIS — R079 Chest pain, unspecified: Secondary | ICD-10-CM

## 2015-12-04 LAB — BASIC METABOLIC PANEL
Anion gap: 3 — ABNORMAL LOW (ref 5–15)
BUN: 11 mg/dL (ref 6–20)
CHLORIDE: 104 mmol/L (ref 101–111)
CO2: 29 mmol/L (ref 22–32)
CREATININE: 0.97 mg/dL (ref 0.61–1.24)
Calcium: 8.2 mg/dL — ABNORMAL LOW (ref 8.9–10.3)
GFR calc Af Amer: >60 mL/min (ref >60)
GFR calc non Af Amer: >60 mL/min (ref >60)
GLUCOSE: 109 mg/dL — AB (ref 65–99)
Potassium: 3.9 mmol/L (ref 3.5–5.1)
SODIUM: 136 mmol/L (ref 135–145)

## 2015-12-04 LAB — GLUCOSE, CAPILLARY
GLUCOSE-CAPILLARY: 118 mg/dL — AB (ref 65–99)
GLUCOSE-CAPILLARY: 40 mg/dL — AB (ref 65–99)
Glucose-Capillary: 119 mg/dL — ABNORMAL HIGH (ref 65–99)
Glucose-Capillary: 139 mg/dL — ABNORMAL HIGH (ref 65–99)
Glucose-Capillary: 174 mg/dL — ABNORMAL HIGH (ref 65–99)
Glucose-Capillary: 182 mg/dL — ABNORMAL HIGH (ref 65–99)
Glucose-Capillary: 233 mg/dL — ABNORMAL HIGH (ref 65–99)
Glucose-Capillary: 94 mg/dL (ref 65–99)

## 2015-12-04 MED ORDER — REGADENOSON 0.4 MG/5ML IV SOLN
0.4000 mg | Freq: Once | INTRAVENOUS | Status: AC
  Administered 2015-12-05: 0.4 mg via INTRAVENOUS
  Filled 2015-12-04: qty 5

## 2015-12-04 NOTE — Progress Notes (Signed)
Hypoglycemic Event  CBG: 40  Treatment: 15 GM carbohydrate snack  Symptoms: Sweaty  Follow-up CBG: Time:2328 CBG Result:94  Possible Reasons for Event: Medication regimen: lantus  Comments/MD notified: no followed hypoglycemic protocol    Aaron Santiago

## 2015-12-04 NOTE — Progress Notes (Signed)
[facility] PROGRESS NOTE  Aaron Santiago AOZ:[id number] DOB: 10/07/1946 DOA: 12/02/2015 PCP: Josue Hector, MD   HPI/Subjective: Substernal chest pain, comes and goes.  Assessment/Plan: 70 y.o. male with PMH of HTN, DM, CAD h/o CABG, s/p PTCA, Substance abuse, who recently discharged from behavioral health Presented with substernal intermittent chest pains  1. Chest pains.  ACS->ruled out. Trop-negative. ECG sinus tachycardia. Pt has h/o CAD h/o CABG, s/p PTCA -Patient reports significant improvement in chest pains overnight. cont ASA, statin, NTG, BB.  -Cardiology evaluated the patient and recommended inpatient stress test to be done in the morning.  2. AKI, dehydration, hypokalemia. Renal function is improving on IVF, Replace electrolytes.. -This is resolved, patient presented with creatinine of 1.37, creatinine currently 0.9.  3. Probable gastritis. Started PPI. No s/s of acute bleeding. May need GI follow up  4. DM. Uncontrolled on admission. Patient is on insulin levemir 40U. Non adherance.  -Restart luntus at 20U.+ISS. Monitor  5. Substance abuse (h/o opioids, cocaine, etoh). Patient denies using drug recently. Cont monitor on CIWA for etoh withdrawals  6. HTN. Cont BB, amlodipine. Monitor   Code Status: full Family Communication: d/w patient (indicate person spoken with, relationship, and if by phone, the number) Disposition Plan: home 24-48 hrs. awaiting cardiology evaluation    Consultants:  Cardiology   Procedures:  none  Antibiotics:  none (indicate start date, and stop date if known)    Objective: Filed Vitals:   12/03/15 2021 12/04/15 0440  BP: 118/62 120/72  Pulse: 77 84  Temp: 98.5 F (36.9 C) 98.5 F (36.9 C)  Resp: 20 20    Intake/Output Summary (Last 24 hours) at 12/04/15 1253 Last data filed at 12/04/15 0900  Gross per 24 hour  Intake    600 ml  Output    900 ml  Net   -300 ml   Filed Weights   12/02/15 1134 12/02/15  1555  Weight: 63.504 kg (140 lb) 64.819 kg (142 lb 14.4 oz)    Exam:   General:  No distress   Cardiovascular: s1,s2 rrr  Respiratory: CTA VL  Abdomen: soft, nt,nd   Musculoskeletal: no leg edem a   Data Reviewed: Basic Metabolic Panel:  Recent Labs Lab 12/02/15 1207 12/03/15 0431 12/04/15 0626  NA 130* 139 136  K 3.1* 3.4* 3.9  CL 90* 102 104  CO2 GLUCOSE 351* 91 109*  BUN CREATININE 1.37* 1.15 0.97  CALCIUM 8.1* 8.1* 8.2*   Liver Function Tests:  Recent Labs Lab 12/02/15 1207  AST 20  ALT 14*  ALKPHOS 134*  BILITOT 0.7  PROT 5.5*  ALBUMIN 3.0*    Recent Labs Lab 12/02/15 1207  LIPASE 13   No results for input(s): AMMONIA in the last 168 hours. CBC:  Recent Labs Lab 12/02/15 1207  WBC 7.9  HGB 12.3*  HCT 35.1*  MCV 81.1  PLT 204   Cardiac Enzymes:  Recent Labs Lab 12/02/15 1207 12/02/15 1623 12/02/15 2159 12/03/15 0431  TROPONINI 0.04* 0.04* 0.04* 0.03   BNP (last 3 results) No results for input(s): BNP in the last 8760 hours.  ProBNP (last 3 results) No results for input(s): PROBNP in the last 8760 hours.  CBG:  Recent Labs Lab 12/03/15 2021 12/04/15 0028 12/04/15 0440 12/04/15 0734 12/04/15 1114  GLUCAP 197* 174* 139* 119* 118*    No results found for this or any previous visit (from the past 240 hour(s)).   Studies: No results  found.  Scheduled Meds: . amLODipine  5 mg Oral Daily  . aspirin  325 mg Oral Daily  . carvedilol  12.5 mg Oral BID  . enoxaparin (LOVENOX) injection  40 mg Subcutaneous Q24H  . escitalopram  10 mg Oral Daily  . folic acid  1 mg Oral Daily  . insulin aspart  0-9 Units Subcutaneous 6 times per day  . insulin glargine  20 Units Subcutaneous QHS  . LORazepam  0-4 mg Oral Q6H   Followed by  . LORazepam  0-4 mg Oral Q12H  . losartan  50 mg Oral Daily  . metoCLOPramide  5 mg Oral TID AC  . multivitamin with minerals  1 tablet Oral Daily  . pantoprazole  40 mg Oral  BID AC  . potassium chloride  40 mEq Oral BID  . [START ON 12/05/2015] regadenoson  0.4 mg Intravenous Once  . simvastatin  20 mg Oral q1800  . sodium chloride  3 mL Intravenous Q12H  . sucralfate  1 g Oral QID  . thiamine  100 mg Oral Daily   Or  . thiamine  100 mg Intravenous Daily   Continuous Infusions: . sodium chloride 1,000 mL (12/02/15 1310)  . sodium chloride 125 mL/hr at 12/04/15 4825    Active Problems:   CAD (coronary artery disease)   Chest pain    Time spent: >35 minutes     [facility]  [facility] Pager [phone]. If 7PM-7AM, please contact night-coverage at [email or web address], password [facility] 12/04/2015, 12:53 PM  LOS: 2 days

## 2015-12-04 NOTE — Progress Notes (Signed)
     Primary cardiologist: Dr. Rachel Moulds  Subjective:  Complaining of chest pressure this am "8" on scale of 1-10. No radiation or shortness of breath. Worse with getting up to BR. Already had jello and coffee. Same pain yesterday with normal EKG. RN says BP drops with NTG.  Objective:  Vital Signs in the last 24 hours: Temp:  [98.2 F (36.8 C)-98.8 F (37.1 C)] 98.5 F (36.9 C) (01/04 0440) Pulse Rate:  [72-94] 84 (01/04 0440) Resp:  [20] 20 (01/04 0440) BP: (87-154)/(52-83) 120/72 mmHg (01/04 0440) SpO2:  [90 %-100 %] 100 % (01/04 0440)  Intake/Output from previous day: 01/03 0701 - 01/04 0700 In: 600 [P.O.:600] Out: 600 [Urine:600]  Physical Exam: NECK: Without JVD, HJR, or bruit LUNGS: Clear anterior, posterior, lateral HEART: Regular rate and rhythm, no murmur, gallop, rub, bruit, thrill, or heave EXTREMITIES: Without cyanosis, clubbing, or edema  Lab Results:  Recent Labs  12/02/15 1207  WBC 7.9  HGB 12.3*  PLT 204    Recent Labs  12/03/15 0431 12/04/15 0626  NA 139 136  K 3.4* 3.9  CL 102 104  CO2 29 29  GLUCOSE 91 109*  BUN 10 11  CREATININE 1.15 0.97    Recent Labs  12/02/15 2159 12/03/15 0431  TROPONINI 0.04* 0.03   Hepatic Function Panel  Recent Labs  12/02/15 1207  PROT 5.5*  ALBUMIN 3.0*  AST 20  ALT 14*  ALKPHOS 134*  BILITOT 0.7  BILIDIR 0.1  IBILI 0.6    Cardiac Studies:  Assessment/Plan:   1. 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 Symptoms have both typical and atypical features for angina. Plan was to F/U myoview as OP and appt with Dr. Kirke Corin: Now with recurrent pain will probably have to do myoview prior to Discharge. Just drank coffee and probably needs lexiscan. Will order for am if patient still here. Nurse giving pain meds now.  2. Multivessel CAD status post single-vessel LIMA to LAD in  2002 with subsequent DES to the circumflex and RCA in 2012. Last formal ischemic testing was in 2013 based on record review.  3. Moderate bilateral carotid artery disease.  4. History of substance abuse as outlined above. Recent Behavioral Health admission due to substance related mood disorder. UDS positive for benzodiazepines and opioids.  5. Essential hypertension.   LOS: 2 days    Jacolyn Reedy 12/04/2015, 8:03 AM   Attending note:  Patient seen and examined. Agree with above assessment by Ms. Geni Bers PA-C. He reported recurrent chest pain earlier this morning of at least moderate intensity. ECGs have not shown acute ST segment change. We had discussed the possibility of arranging an outpatient Myoview with plan to see Dr. Kirke Corin, however with his recurring symptoms on medical therapy we will go ahead and arrange this as an inpatient prior to discharge. He already ate today and therefore we will schedule this for tomorrow morning. Otherwise, no signficant change on examination. Blood pressure and heart rate has been stable. Labwork reviewed.  Jonelle Sidle, M.D., F.A.C.C.

## 2015-12-05 ENCOUNTER — Inpatient Hospital Stay (HOSPITAL_COMMUNITY): Payer: Medicare Other

## 2015-12-05 ENCOUNTER — Encounter (HOSPITAL_COMMUNITY): Payer: Self-pay

## 2015-12-05 ENCOUNTER — Inpatient Hospital Stay (HOSPITAL_BASED_OUTPATIENT_CLINIC_OR_DEPARTMENT_OTHER): Payer: Medicare Other

## 2015-12-05 DIAGNOSIS — I119 Hypertensive heart disease without heart failure: Principal | ICD-10-CM

## 2015-12-05 DIAGNOSIS — I2510 Atherosclerotic heart disease of native coronary artery without angina pectoris: Secondary | ICD-10-CM

## 2015-12-05 DIAGNOSIS — Z720 Tobacco use: Secondary | ICD-10-CM

## 2015-12-05 DIAGNOSIS — R079 Chest pain, unspecified: Secondary | ICD-10-CM

## 2015-12-05 LAB — NM MYOCAR MULTI W/SPECT W/WALL MOTION / EF
CHL CUP MPHR: 151 {beats}/min
CHL CUP NUCLEAR SDS: 3
CHL CUP NUCLEAR SRS: 0
CHL CUP NUCLEAR SSS: 3
CHL CUP RESTING HR STRESS: 63 {beats}/min
CHL RATE OF PERCEIVED EXERTION: 0
CSEPHR: 72 %
Estimated workload: 1 METS
Exercise duration (min): 0 min
Exercise duration (sec): 0 s
LHR: 0.17
LV dias vol: 61 mL
LVSYSVOL: 17 mL
Peak HR: 110 {beats}/min
TID: 0.69

## 2015-12-05 LAB — GLUCOSE, CAPILLARY
GLUCOSE-CAPILLARY: 202 mg/dL — AB (ref 65–99)
Glucose-Capillary: 182 mg/dL — ABNORMAL HIGH (ref 65–99)

## 2015-12-05 MED ORDER — SODIUM CHLORIDE 0.9 % IJ SOLN
INTRAMUSCULAR | Status: AC
  Administered 2015-12-05: 10 mL via INTRAVENOUS
  Filled 2015-12-05: qty 3

## 2015-12-05 MED ORDER — REGADENOSON 0.4 MG/5ML IV SOLN
INTRAVENOUS | Status: AC
  Administered 2015-12-05: 0.4 mg via INTRAVENOUS
  Filled 2015-12-05: qty 5

## 2015-12-05 MED ORDER — TECHNETIUM TC 99M SESTAMIBI - CARDIOLITE
10.0000 | Freq: Once | INTRAVENOUS | Status: AC | PRN
  Administered 2015-12-05: 10.1 via INTRAVENOUS

## 2015-12-05 MED ORDER — INSULIN ASPART 100 UNIT/ML ~~LOC~~ SOLN
0.0000 [IU] | Freq: Three times a day (TID) | SUBCUTANEOUS | Status: DC
  Administered 2015-12-05: 2 [IU] via SUBCUTANEOUS

## 2015-12-05 MED ORDER — TECHNETIUM TC 99M SESTAMIBI GENERIC - CARDIOLITE
30.0000 | Freq: Once | INTRAVENOUS | Status: AC | PRN
  Administered 2015-12-05: 31 via INTRAVENOUS

## 2015-12-05 NOTE — Care Management Important Message (Signed)
Important Message  Patient Details  Name: Aaron Santiago MRN: [id number] Date of Birth: 09/16/1946   Medicare Important Message Given:  N/A - LOS <3 / Initial given by admissions    Cheryl Flash, RN 12/05/2015, 12:20 PM

## 2015-12-05 NOTE — Discharge Summary (Signed)
Physician Discharge Summary  Aaron Santiago BXU:[id number] DOB: 1946-03-08 DOA: 12/02/2015  PCP: Josue Hector, MD  Admit date: 12/02/2015 Discharge date: 12/05/2015  Time spent: 40 minutes  Recommendations for Outpatient Follow-up:  1. Follow-up with Dr. 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 male with PMH of HTN, DM, CAD h/op CABG, s/p PTCA, Substance abuse, who recently discharged from behavioral health Presented with substernal intermittent chest pains for 2-3 days. He denies exertional pains, he tried NTG without significant relief. He also had 2 episode of vomiting. He denies SOB, no cough. He report previous h/o similar symptoms, but not severe.   Hospital Course:   Chest pain -ACS->ruled out. Trop-negative. ECG sinus tachycardia. Pt has h/o CAD h/o CABG, s/p PTCA -Patient reports significant improvement in chest pains overnight.  -Cardiology consulted and their initial recommendation to do outpatient stress test. -Patient continues to have chest pain so inpatient stress testing was done and showed low risk test. -Continue aspirin, Coreg and losartan for CAD history  AKI, dehydration, hypokalemia -This is likely secondary to dehydration, hypokalemia treated with oral supplements. -This is resolved, patient presented with creatinine of 1.37, creatinine currently 0.9.  Probable gastritis -Treated while in the hospital with PPI, continue Dexilant and Carafate as outpatient.  DM. Uncontrolled on admission. Patient is on insulin levemir 40U. Non adherance.   -Restart luntus at 20U.+ISS. Monitor  -Hemoglobin A1c of 7.7, correlates with mean plasma glucose of 172, indicating uncontrolled diabetes mellitus. -Patient counseled extensively about carbohydrate modified diet and taking insulin as directed.  Substance abuse (h/o opioids, cocaine, etoh). Patient denies using drug recently. Cont monitor on CIWA for etoh withdrawals   HTN. Cont BB, amlodipine. Monitor   Procedures:  Nuclear stress test  Study Result      There was no ST segment deviation noted during stress.  The study is normal.  This is a low risk study.  The left ventricular ejection fraction is hyperdynamic (>65%).  Normal Lexiscan stress myovue No ischemia or infarction EF 72%     Consultations:  Cardiology  Discharge Exam: Filed Vitals:   12/04/15 2343 12/05/15 0620  BP: 140/76 190/87  Pulse: 82 77  Temp:  97.3 F (36.3 C)  Resp:  16   General: Alert and awake, oriented x3, not in any acute distress. HEENT: anicteric sclera, pupils reactive to light and accommodation, EOMI CVS: S1-S2 clear, no murmur rubs or gallops Chest: clear to auscultation bilaterally, no wheezing, rales or rhonchi Abdomen: soft nontender, nondistended, normal bowel sounds, no organomegaly Extremities: no cyanosis, clubbing or edema noted bilaterally Neuro: Cranial nerves II-XII intact, no focal neurological deficits  Discharge Instructions   Discharge Instructions    Diet - low sodium heart healthy    Complete by:  As directed      Increase activity slowly    Complete by:  As directed           Current Discharge Medication List    CONTINUE these medications which have NOT CHANGED   Details  acetaminophen (TYLENOL) 325 MG  tablet Take 2 tablets (650 mg total) by mouth every 6 (six) hours as needed for mild pain.    amLODipine (NORVASC) 5 MG tablet Take 1 tablet (5 mg total) by mouth daily. Qty: 30 tablet, Refills: 6    aspirin EC 81 MG tablet Take 1 tablet (81 mg  total) by mouth daily.    CARAFATE 1 GM/10ML suspension Take 10 mLs (1 g total) by mouth 4 (four) times daily. Take 10 mls by mouth 4 times daily one hour before meals Qty: 420 mL, Refills: 0    carvedilol (COREG) 12.5 MG tablet Take 1 tablet (12.5 mg total) by mouth 2 (two) times daily. Qty: 180 tablet, Refills: 1    DEXILANT 60 MG capsule Take 1 capsule (60 mg total) by mouth every morning. Qty: 30 capsule    escitalopram (LEXAPRO) 10 MG tablet Take 1 tablet (10 mg total) by mouth daily.    hydroxypropyl methylcellulose (ISOPTO TEARS) 2.5 % ophthalmic solution Place 1 drop into both eyes 4 (four) times daily as needed for dry eyes.     insulin detemir (LEVEMIR) 100 UNIT/ML injection Inject 0.4 mLs (40 Units total) into the skin 1 day or 1 dose. Before breakfast Qty: 10 mL, Refills: 11    Iron, Ferrous Gluconate, 256 (28 Fe) MG TABS Take 65 mg by mouth daily.    loratadine (CLARITIN) 10 MG tablet Take 10 mg by mouth daily as needed for allergies or itching.     losartan (COZAAR) 50 MG tablet Take 1 tablet (50 mg total) by mouth daily. Qty: 90 tablet, Refills: 3   Associated Diagnoses: Carotid stenosis, bilateral; Coronary artery disease involving native coronary artery of native heart without angina pectoris    metFORMIN (GLUCOPHAGE) 500 MG tablet Take 2 tablets (1,000 mg total) by mouth 2 (two) times daily with a meal.    metoCLOPramide (REGLAN) 5 MG tablet Take 1 tablet (5 mg total) by mouth 3 (three) times daily before meals.    Multiple Vitamin (MULTIVITAMIN WITH MINERALS) TABS tablet Take 1 tablet by mouth daily.    Omega-3 Fatty Acids (FISH OIL) 1200 MG CAPS Take 1 capsule (1,200 mg total) by mouth daily.    ondansetron (ZOFRAN) 4 MG tablet Take 4 mg by mouth every 8 (eight) hours as needed for nausea or vomiting.      STOP taking these medications     naproxen (NAPROSYN) 500 MG tablet        No Known Allergies Follow-up Information    Follow up with Lorine Bears, MD On 12/17/2015.   Specialty:  Cardiology   Why:  at 9:15 am   Contact information:   [address] [place] [address] [phone]        The results of significant diagnostics from this hospitalization (including imaging, microbiology, ancillary and laboratory) are listed below for reference.    Significant Diagnostic Studies: Dg Chest 2 View  12/02/2015  CLINICAL DATA:  Chest pain. EXAM: CHEST  2 VIEW COMPARISON:  06/05/2013 FINDINGS: Heart size and pulmonary vascularity are normal and the lungs are clear. No effusions. Previous median sternotomy. Surgical clips at the GE junction, probably due to hiatal hernia repair. Calcification in the thoracic aorta. No significant osseous abnormality. IMPRESSION: No acute disease.  Aortic atherosclerosis. Electronically Signed   By: Francene Boyers M.D.   On: 12/02/2015 12:02   Nm Myocar Multi W/spect W/wall Motion / Ef  12/05/2015   There was no ST segment deviation noted  during stress.  The study is normal.  This is a low risk study.  The left ventricular ejection fraction is hyperdynamic (>65%).  Normal Lexiscan stress myovue No ischemia or infarction EF 72%    Microbiology: No results found for this or any previous visit (from the past 240 hour(s)).   Labs: Basic Metabolic Panel:  Recent Labs Lab 12/02/15 1207 12/03/15 0431 12/04/15 0626  NA 130* 139 136  K 3.1* 3.4* 3.9  CL 90* 102 104  CO2 28 29 29   GLUCOSE 351* 91 109*  BUN 10 10 11   CREATININE 1.37* 1.15 0.97  CALCIUM 8.1* 8.1* 8.2*   Liver Function Tests:  Recent Labs Lab 12/02/15 1207  AST 20  ALT 14*  ALKPHOS 134*  BILITOT 0.7  PROT 5.5*  ALBUMIN 3.0*    Recent Labs Lab 12/02/15 1207  LIPASE 13   No results for input(s): AMMONIA in the last 168 hours. CBC:  Recent Labs Lab 12/02/15 1207  WBC 7.9  HGB 12.3*  HCT 35.1*  MCV 81.1  PLT 204   Cardiac Enzymes:  Recent Labs Lab 12/02/15 1207 12/02/15 1623 12/02/15 2159  12/03/15 0431  TROPONINI 0.04* 0.04* 0.04* 0.03   BNP: BNP (last 3 results) No results for input(s): BNP in the last 8760 hours.  ProBNP (last 3 results) No results for input(s): PROBNP in the last 8760 hours.  CBG:  Recent Labs Lab 12/04/15 2003 12/04/15 2309 12/04/15 2329 12/05/15 0325 12/05/15 1128  GLUCAP 233* 40* 94 202* 182*       Signed:  Clydia Llano A MD   [facility] 12/05/2015, 11:43 AM

## 2015-12-05 NOTE — Progress Notes (Signed)
lexiscan myoview completed, nuc results to follow.

## 2015-12-05 NOTE — Progress Notes (Signed)
Subjective: No pain during the night, + nightmares  Objective: Vital signs in last 24 hours: Temp:  [97.3 F (36.3 C)-98.2 F (36.8 C)] 97.3 F (36.3 C) (01/05 0620) Pulse Rate:  [58-82] 77 (01/05 0620) Resp:  [16-20] 16 (01/05 0620) BP: (102-190)/(71-93) 190/87 mmHg (01/05 0620) SpO2:  [96 %-100 %] 100 % (01/05 0620) Weight change:    Intake/Output from previous day: 01/04 0701 - 01/05 0700 In: 480 [P.O.:480] Out: 2400 [Urine:2400] Intake/Output this shift:    PE: General:Pleasant affect, NAD Skin:Warm and dry, brisk capillary refill HEENT:normocephalic, sclera clear, mucus membranes moist Heart:S1S2 RRR without murmur, gallup, rub or click Lungs:clear without rales, rhonchi, or wheezes EBX:IDHW, non tender, + BS, do not palpate liver spleen or masses Ext:no lower ext edema, 2+ pedal pulses, 2+ radial pulses Neuro:alert and oriented X3, MAE, follows commands, + facial symmetry   Lab Results:  Recent Labs  12/02/15 1207  WBC 7.9  HGB 12.3*  HCT 35.1*  PLT 204   BMET  Recent Labs  12/03/15 0431 12/04/15 0626  NA 139 136  K 3.4* 3.9  CL 102 104  CO2 29 29  GLUCOSE 91 109*  BUN 10 11  CREATININE 1.15 0.97  CALCIUM 8.1* 8.2*    Recent Labs  12/02/15 2159 12/03/15 0431  TROPONINI 0.04* 0.03    Lab Results  Component Value Date   CHOL 103 11/06/2011   HDL 53 11/06/2011   LDLCALC 39 11/06/2011   TRIG 55 11/06/2011   CHOLHDL 1.9 11/06/2011   Lab Results  Component Value Date   HGBA1C 7.7* 12/02/2015     Lab Results  Component Value Date   TSH 1.156 11/05/2011    Hepatic Function Panel  Recent Labs  12/02/15 1207  PROT 5.5*  ALBUMIN 3.0*  AST 20  ALT 14*  ALKPHOS 134*  BILITOT 0.7  BILIDIR 0.1  IBILI 0.6   No results for input(s): CHOL in the last 72 hours. No results for input(s): PROTIME in the last 72 hours.     Studies/Results: No results found.  Medications: I have reviewed the patient's current  medications. Scheduled Meds: . amLODipine  5 mg Oral Daily  . aspirin  325 mg Oral Daily  . carvedilol  12.5 mg Oral BID  . enoxaparin (LOVENOX) injection  40 mg Subcutaneous Q24H  . escitalopram  10 mg Oral Daily  . folic acid  1 mg Oral Daily  . insulin aspart  0-9 Units Subcutaneous TID WC  . insulin glargine  20 Units Subcutaneous QHS  . LORazepam  0-4 mg Oral Q12H  . losartan  50 mg Oral Daily  . metoCLOPramide  5 mg Oral TID AC  . multivitamin with minerals  1 tablet Oral Daily  . pantoprazole  40 mg Oral BID AC  . potassium chloride  40 mEq Oral BID  . simvastatin  20 mg Oral q1800  . sodium chloride  3 mL Intravenous Q12H  . sucralfate  1 g Oral QID  . thiamine  100 mg Oral Daily   Or  . thiamine  100 mg Intravenous Daily   Continuous Infusions: . sodium chloride 1,000 mL (12/04/15 1257)  . sodium chloride 125 mL/hr at 12/04/15 0509   PRN Meds:.acetaminophen **OR** acetaminophen, LORazepam **OR** LORazepam, morphine injection, nitroGLYCERIN, polyvinyl alcohol, technetium sestamibi generic  Assessment/Plan:  1. 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 Symptoms have both typical and atypical features for angina. Plan was to F/U myoview as OP and appt with Dr. Kirke Corin: Now with recurrent pain myoview for today.   2. Multivessel CAD status post single-vessel LIMA to LAD in 2002 with subsequent DES to the circumflex and RCA in 2012. Last formal ischemic testing was in 2013 based on record review.  3. Moderate bilateral carotid artery disease.  4. History of substance abuse. Recent Behavioral Health admission due to substance related mood disorder. UDS positive for benzodiazepines and opioids.  5. Essential hypertension  LOS: 3 days   Time spent with pt. :15 minutes. [facility] R  Nurse Practitioner Certified Pager [phone] or after 5pm and on weekends  call [phone] 12/05/2015, 8:41 AM   Pain free benign exam Reviewed myovue  Normal with no ischemia or infarction EF 72% Ok to d/c home

## 2015-12-05 NOTE — Care Management Note (Signed)
Case Management Note  Patient Details  Name: KELVIS GUILD MRN: [id number] Date of Birth: 08-Jun-1946  Subjective/Objective:                    Action/Plan:   Expected Discharge Date:                  Expected Discharge Plan:  Home/Self Care  In-House Referral:  NA  Discharge planning Services  CM Consult  Post Acute Care Choice:  NA Choice offered to:  NA  DME Arranged:    DME Agency:     HH Arranged:    HH Agency:     Status of Service:  Completed, signed off  Medicare Important Message Given:  N/A - LOS <3 / Initial given by admissions Date Medicare IM Given:    Medicare IM give by:    Date Additional Medicare IM Given:    Additional Medicare Important Message give by:     If discussed at Long Length of Stay Meetings, dates discussed:    Additional Comments: Pt discharged home today. No CM needs noted. Arlyss Queen Brooks, RN 12/05/2015, 12:20 PM

## 2015-12-06 ENCOUNTER — Other Ambulatory Visit: Payer: Self-pay | Admitting: Cardiovascular Disease

## 2015-12-06 NOTE — Telephone Encounter (Signed)
Requested Prescriptions   Pending Prescriptions Disp Refills  . carvedilol (COREG) 12.5 MG tablet [Pharmacy Med Name: CARVEDILOL 12.5MG    TAB] 180 tablet 3    Sig: TAKE ONE TABLET BY MOUTH TWICE DAILY

## 2015-12-17 ENCOUNTER — Ambulatory Visit (INDEPENDENT_AMBULATORY_CARE_PROVIDER_SITE_OTHER): Payer: Medicare Other | Admitting: Cardiovascular Disease

## 2015-12-17 ENCOUNTER — Encounter: Payer: Self-pay | Admitting: Cardiovascular Disease

## 2015-12-17 VITALS — BP 100/76 | HR 52 | Ht 72.0 in | Wt 149.8 lb

## 2015-12-17 DIAGNOSIS — E7800 Pure hypercholesterolemia, unspecified: Secondary | ICD-10-CM | POA: Diagnosis not present

## 2015-12-17 DIAGNOSIS — I2510 Atherosclerotic heart disease of native coronary artery without angina pectoris: Secondary | ICD-10-CM | POA: Diagnosis not present

## 2015-12-17 DIAGNOSIS — I6523 Occlusion and stenosis of bilateral carotid arteries: Secondary | ICD-10-CM | POA: Diagnosis not present

## 2015-12-17 DIAGNOSIS — I10 Essential (primary) hypertension: Secondary | ICD-10-CM | POA: Diagnosis not present

## 2015-12-17 MED ORDER — NITROGLYCERIN 0.4 MG SL SUBL: 0.4000 mg | SUBLINGUAL_TABLET | SUBLINGUAL | Status: AC | PRN
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
## 2015-12-17 NOTE — Patient Instructions (Addendum)
Medication Instructions: Continue same medications.   Labwork: None.   Procedures/Testing: None.   Follow-Up: 3 months with Dr. Kirke Corin  Any Additional Special Instructions Will Be Listed Below (If Applicable).  Refills on Nitroglycerin were sent to your [place] pharmacy.

## 2015-12-19 NOTE — Assessment & Plan Note (Signed)
Continue treatment with pravastatin with a target LDL of less than 70. 

## 2015-12-19 NOTE — Assessment & Plan Note (Signed)
The patient's current chest pain seems to be musculoskeletal. Recent nuclear stress test was normal. I recommend continuing medical therapy. The biggest issue  his for him to continue rehabilitation from a cardiac and alcohol use.

## 2015-12-19 NOTE — Assessment & Plan Note (Signed)
Blood pressure is well controlled on current medications. 

## 2015-12-19 NOTE — Assessment & Plan Note (Signed)
He has known moderate bilateral carotid disease worse on the right side. Continue treatment of risk factors and repeat carotid Doppler in May 2017.

## 2016-03-07 ENCOUNTER — Other Ambulatory Visit: Payer: Self-pay | Admitting: Cardiovascular Disease

## 2016-04-13 ENCOUNTER — Other Ambulatory Visit: Payer: Self-pay | Admitting: Cardiovascular Disease

## 2016-04-13 DIAGNOSIS — I6523 Occlusion and stenosis of bilateral carotid arteries: Secondary | ICD-10-CM

## 2016-05-04 ENCOUNTER — Ambulatory Visit (HOSPITAL_COMMUNITY)
Admission: RE | Admit: 2016-05-04 | Discharge: 2016-05-04 | Disposition: A | Discharge status: Home / Self Care | Payer: Medicare Other | Source: Ambulatory Visit | Attending: Cardiovascular Disease | Admitting: Cardiovascular Disease

## 2016-05-04 DIAGNOSIS — I2510 Atherosclerotic heart disease of native coronary artery without angina pectoris: Secondary | ICD-10-CM | POA: Diagnosis not present

## 2016-05-04 DIAGNOSIS — E785 Hyperlipidemia, unspecified: Secondary | ICD-10-CM | POA: Diagnosis not present

## 2016-05-04 DIAGNOSIS — E119 Type 2 diabetes mellitus without complications: Secondary | ICD-10-CM | POA: Insufficient documentation

## 2016-05-04 DIAGNOSIS — I110 Hypertensive heart disease with heart failure: Secondary | ICD-10-CM | POA: Diagnosis not present

## 2016-05-04 DIAGNOSIS — I6523 Occlusion and stenosis of bilateral carotid arteries: Secondary | ICD-10-CM | POA: Insufficient documentation

## 2016-05-04 DIAGNOSIS — I509 Heart failure, unspecified: Secondary | ICD-10-CM | POA: Diagnosis not present

## 2016-05-04 DIAGNOSIS — K219 Gastro-esophageal reflux disease without esophagitis: Secondary | ICD-10-CM | POA: Insufficient documentation

## 2016-05-12 ENCOUNTER — Ambulatory Visit (INDEPENDENT_AMBULATORY_CARE_PROVIDER_SITE_OTHER): Payer: Medicare Other | Admitting: Cardiovascular Disease

## 2016-05-12 ENCOUNTER — Encounter: Payer: Self-pay | Admitting: Cardiovascular Disease

## 2016-05-12 ENCOUNTER — Other Ambulatory Visit: Payer: Self-pay | Admitting: Cardiovascular Disease

## 2016-05-12 VITALS — BP 109/66 | HR 76 | Ht 70.0 in | Wt 166.8 lb

## 2016-05-12 DIAGNOSIS — I739 Peripheral vascular disease, unspecified: Secondary | ICD-10-CM

## 2016-05-12 DIAGNOSIS — I6523 Occlusion and stenosis of bilateral carotid arteries: Secondary | ICD-10-CM | POA: Diagnosis not present

## 2016-05-12 DIAGNOSIS — I10 Essential (primary) hypertension: Secondary | ICD-10-CM | POA: Diagnosis not present

## 2016-05-12 NOTE — Patient Instructions (Signed)

## 2016-05-12 NOTE — Telephone Encounter (Signed)
Please review for refill. Thanks!  

## 2016-05-12 NOTE — Progress Notes (Signed)
Cardiology Office Note   Date:  05/12/2016   ID:  [name], DOB 1946/02/16, MRN [id number]  PCP:  Josue Hector, MD  Cardiologist:   Lorine Bears, MD   Chief Complaint  Patient presents with  . Chest Pain    pt c/o pain in right side of chest      History of Present Illness: Aaron Santiago is a 70 y.o. male who presents for  a followup visit. He has known history of coronary artery disease status post one-vessel CABG in 2002 with LIMA to LAD. He presented in December 2012 with non-ST elevation myocardial infarction. Cardiac catheterization showed patent LIMA to LAD with significant native vessel disease in the RCA and left circumflex. He underwent angioplasty and drug-eluting stent placement to both.  He does have bilateral carotid disease Which has been stable on duplex.Marland Kitchen Unfortunately, he started taking oxycodone for chronic back pain and became addicted. When he ran out, he started drinking heavily. He went to the emergency room on December 22 and the 25th with alcohol intoxication. He was hospitalized to behavior medicine for 2 days on December 26. He was prescribed Ativan for detox. He was then hospitalized at [facility] on January 2 with atypical chest pain and vomiting. He ruled out for myocardial infarction. He underwent a pharmacologic nuclear stress test which showed no evidence of ischemia with normal ejection fraction. He has been doing well overall and quit alcohol completely. He is attending AA meetings. He is not using any narcotic pain medications. He continues to have intermittent atypical chest pain. This is described as left sided aching and dull discomfort which sometimes can be continuous for few days. It happens at rest and not with physical activities.   Past Medical History  Diagnosis Date  . CAD (coronary artery disease)     LIMA to LAD 2002, DES to circumflex and RCA 2012  . History of substance abuse     Alcohol and cocaine  .  Peptic ulcer disease   . Type 2 diabetes mellitus (HCC)   . Stomach ulcer 1972    Antrectomy  . GERD (gastroesophageal reflux disease)   . Arthritis   . Carotid artery stenosis     Moderate bilateral disease  . Hyperlipidemia   . Essential hypertension   . Seasonal allergies   . CHF (congestive heart failure) [facility])     Past Surgical History  Procedure Laterality Date  . Antrectomy  [id number]  . Cholecystectomy  06/2010  . Fracture surgery    . Femur fracture surgery  ~ 1964    left  . Coronary artery bypass graft  2002    CABG X1  . Coronary angioplasty with stent placement  11/06/11    2 DES to mid LCX and proximal RCA. 60% LAD stenosis after LIMA anastomosis  . Knee arthroscopy with medial menisectomy Right 06/06/2013    Procedure: RIGHT KNEE ARTHROSCOPY WITH PARTIAL MEDIAL MENISCECTOMY;  Surgeon: Budd Palmer, MD;  [facility]: [facility] OR;  Service: Orthopedics;  Laterality: Right;  . Left heart catheterization with coronary angiogram N/A 11/06/2011    Procedure: LEFT HEART CATHETERIZATION WITH CORONARY ANGIOGRAM;  Surgeon: Iran Ouch, MD;  Location: [facility] CATH LAB;  Service: Cardiovascular;  Laterality: N/A;  . Percutaneous coronary stent intervention (pci-s) N/A 11/06/2011    Procedure: PERCUTANEOUS CORONARY STENT INTERVENTION (PCI-S);  Surgeon: Iran Ouch, MD;  Location: [facility] CATH LAB;  Service: Cardiovascular;  Laterality: N/A;     Current  Outpatient Prescriptions  Medication Sig Dispense Refill  . acetaminophen (TYLENOL) 325 MG tablet Take 2 tablets (650 mg total) by mouth every 6 (six) hours as needed for mild pain.    Marland Kitchen amLODipine (NORVASC) 5 MG tablet Take 1 tablet (5 mg total) by mouth daily. 30 tablet 6  . aspirin EC 81 MG tablet Take 1 tablet (81 mg total) by mouth daily.    Marland Kitchen CARAFATE 1 GM/10ML suspension Take 10 mLs (1 g total) by mouth 4 (four) times daily. Take 10 mls by mouth 4 times daily one hour before meals 420 mL 0  . carvedilol (COREG) 12.5 MG tablet TAKE ONE  TABLET BY MOUTH TWICE DAILY 180 tablet 3  . DEXILANT 60 MG capsule Take 1 capsule (60 mg total) by mouth every morning. 30 capsule   . escitalopram (LEXAPRO) 10 MG tablet Take 1 tablet (10 mg total) by mouth daily.    Marland Kitchen escitalopram (LEXAPRO) 20 MG tablet Take 20 mg by mouth daily. Take daily in the evening    . fluticasone (FLONASE) 50 MCG/ACT nasal spray Place 1-2 sprays into both nostrils daily.    . hydroxypropyl methylcellulose (ISOPTO TEARS) 2.5 % ophthalmic solution Place 1 drop into both eyes 4 (four) times daily as needed for dry eyes.     . insulin detemir (LEVEMIR) 100 UNIT/ML injection Inject 0.4 mLs (40 Units total) into the skin 1 day or 1 dose. Before breakfast 10 mL 11  . Iron, Ferrous Gluconate, 256 (28 Fe) MG TABS Take 65 mg by mouth daily.    Marland Kitchen loratadine (CLARITIN) 10 MG tablet Take 10 mg by mouth daily as needed for allergies or itching.     Marland Kitchen LORazepam (ATIVAN) 1 MG tablet Take 1 mg by mouth daily. Take at bedtime    . losartan (COZAAR) 50 MG tablet Take 1 tablet (50 mg total) by mouth daily. 90 tablet 3  . metoCLOPramide (REGLAN) 5 MG tablet Take 1 tablet (5 mg total) by mouth 3 (three) times daily before meals.    . Multiple Vitamin (MULTIVITAMIN WITH MINERALS) TABS tablet Take 1 tablet by mouth daily.    . naproxen (NAPROSYN) 500 MG tablet Take 500 mg by mouth daily as needed.    . nitroGLYCERIN (NITROSTAT) 0.4 MG SL tablet Place 1 tablet (0.4 mg total) under the tongue every 5 (five) minutes as needed for chest pain. (up to 3 doses) 25 tablet 6  . Omega-3 Fatty Acids (FISH OIL) 1200 MG CAPS Take 1 capsule (1,200 mg total) by mouth daily.    . ondansetron (ZOFRAN) 4 MG tablet Take 4 mg by mouth every 8 (eight) hours as needed for nausea or vomiting.    . pravastatin (PRAVACHOL) 20 MG tablet Take 20 mg by mouth daily.    . SitaGLIPtin-MetFORMIN HCl (JANUMET XR) 50-1000 MG TB24 Take 1 tablet by mouth 2 (two) times daily.    . vitamin B-12 (CYANOCOBALAMIN) 500 MCG tablet Take  1 tablet by mouth daily.     No current facility-administered medications for this visit.    Allergies:   Review of patient's allergies indicates no known allergies.    Social History:  The patient  reports that he has been smoking Cigarettes.  He has a 27.5 pack-year smoking history. He has never used smokeless tobacco. He reports that he drinks alcohol. He reports that he uses illicit drugs (Hydrocodone, Marijuana, and Cocaine).   Family History:  The patient's family history includes Alcohol abuse in his father; Hypertension  in his mother.    ROS:  Please see the history of present illness.   Otherwise, review of systems are positive for none.   All other systems are reviewed and negative.    PHYSICAL EXAM: VS:  BP 109/66 mmHg  Pulse 76  Ht 5\' 10"  (1.778 m)  Wt 166 lb 12.8 oz (75.66 kg)  BMI 23.93 kg/m2 , BMI Body mass index is 23.93 kg/(m^2). GEN: Well nourished, well developed, in no acute distress HEENT: normal Neck: no JVD, carotid bruits, or masses Cardiac: RRR; no murmurs, rubs, or gallops,no edema  Respiratory:  clear to auscultation bilaterally, normal work of breathing GI: soft, nontender, nondistended, + BS MS: no deformity or atrophy Skin: warm and dry, no rash Neuro:  Strength and sensation are intact Psych: euthymic mood, full affect   EKG:  EKG is ordered today. The ekg ordered today demonstrates normal sinus rhythm with possible anterior infarct with no significant ST or T wave changes. The EKG is not different from his prior one.   Recent Labs: 12/02/2015: ALT 14*; Hemoglobin 12.3*; Platelets 204 12/04/2015: BUN 11; Creatinine, Ser 0.97; Potassium 3.9; Sodium 136    Lipid Panel    Component Value Date/Time   CHOL 103 11/06/2011 0520   TRIG 55 11/06/2011 0520   HDL 53 11/06/2011 0520   CHOLHDL 1.9 11/06/2011 0520   VLDL 11 11/06/2011 0520   LDLCALC 39 11/06/2011 0520      Wt Readings from Last 3 Encounters:  05/12/16 166 lb 12.8 oz (75.66 kg)    12/17/15 149 lb 12.8 oz (67.949 kg)  12/02/15 142 lb 14.4 oz (64.819 kg)        ASSESSMENT AND PLAN:  1.  Coronary artery disease involving native coronary arteries without angina: Chest pain is atypical and seems to be musculoskeletal as it was last time. EKG does not show any acute changes and recent stress test was unremarkable. Continue medical therapy.  2. Bilateral carotid stenosis: This has been stable by carotid Doppler. Repeat study in one year.  3. Essential hypertension: Blood pressure is under excellent control on current medications.  4. Hyperlipidemia: I reviewed his lipid profile from May which showed a triglyceride of 105, HDL of 59 and LDL of 48. Continue treatment with pravastatin.    Disposition:   FU with me in 6 months  Signed,  Lorine Bears, MD  05/12/2016 8:46 AM    [facility]

## 2016-05-13 ENCOUNTER — Other Ambulatory Visit: Payer: Self-pay | Admitting: *Deleted

## 2016-05-13 DIAGNOSIS — I6523 Occlusion and stenosis of bilateral carotid arteries: Secondary | ICD-10-CM

## 2016-05-13 DIAGNOSIS — I2510 Atherosclerotic heart disease of native coronary artery without angina pectoris: Secondary | ICD-10-CM

## 2016-05-13 MED ORDER — LOSARTAN POTASSIUM 50 MG PO TABS: 50.0000 mg | ORAL_TABLET | Freq: Every day | ORAL | Status: DC

## 2016-05-13 NOTE — Telephone Encounter (Signed)
losartan (COZAAR) 50 MG tablet  Medication   Date: 11/27/2015  Department: BEHAVIORAL HEALTH OBSERVATION UNIT  Ordering/Authorizing: Thermon Leyland, NP     Needs to go back to [facility]

## 2016-05-13 NOTE — Telephone Encounter (Signed)
Please advise 

## 2016-05-15 ENCOUNTER — Telehealth: Payer: Self-pay | Admitting: Cardiovascular Disease

## 2016-05-15 NOTE — Telephone Encounter (Signed)
losartan (COZAAR) 50 MG tablet  Medication   Date: 05/13/2016  Department: [facility] [address] Office  Ordering/Authorizing: Iran Ouch, MD      Order Providers    Prescribing Provider Encounter Provider   Iran Ouch, MD Valrie Hart, CMA    Medication Detail      Disp Refills Start End     losartan (COZAAR) 50 MG tablet 90 tablet 3 05/13/2016     Sig - Route: Take 1 tablet (50 mg total) by mouth daily. - Oral    E-Prescribing Status: Receipt confirmed by pharmacy (05/13/2016 12:55 PM EDT)     Associated Diagnoses    Carotid stenosis, bilateral - Primary      Coronary artery disease involving native coronary artery of native heart without angina pectoris       Pharmacy    [id number] - [place], [place] - [address]   done

## 2016-05-15 NOTE — Telephone Encounter (Signed)
*  STAT* If patient is at the pharmacy, call can be transferred to refill team.   1. Which medications need to be refilled? (please list name of each medication and dose if known) Losartan 50 mg  2. Which pharmacy/location (including street and city if local pharmacy) is medication to be sent to?[facility] [place]   3. Do they need a 30 day or 90 day supply? 90

## 2016-07-09 ENCOUNTER — Other Ambulatory Visit (HOSPITAL_COMMUNITY): Payer: Self-pay | Admitting: Family Medicine

## 2016-07-09 ENCOUNTER — Ambulatory Visit (HOSPITAL_COMMUNITY)
Admission: RE | Admit: 2016-07-09 | Discharge: 2016-07-09 | Disposition: A | Discharge status: Home / Self Care | Payer: Medicare Other | Source: Ambulatory Visit | Attending: Family Medicine | Admitting: Family Medicine

## 2016-07-09 DIAGNOSIS — M545 Low back pain: Secondary | ICD-10-CM | POA: Diagnosis present

## 2016-07-09 DIAGNOSIS — I700 Atherosclerosis of aorta: Secondary | ICD-10-CM | POA: Insufficient documentation

## 2016-07-09 DIAGNOSIS — G8929 Other chronic pain: Secondary | ICD-10-CM | POA: Diagnosis present

## 2016-08-14 ENCOUNTER — Emergency Department (HOSPITAL_COMMUNITY): Payer: Medicare Other

## 2016-08-14 ENCOUNTER — Encounter (HOSPITAL_COMMUNITY): Payer: Self-pay | Admitting: Emergency Medicine

## 2016-08-14 ENCOUNTER — Emergency Department (HOSPITAL_COMMUNITY)
Admission: EM | Admit: 2016-08-14 | Discharge: 2016-08-14 | Disposition: A | Discharge status: Home / Self Care | Payer: Medicare Other | Attending: Emergency Medicine | Admitting: Emergency Medicine

## 2016-08-14 DIAGNOSIS — I110 Hypertensive heart disease with heart failure: Secondary | ICD-10-CM | POA: Diagnosis not present

## 2016-08-14 DIAGNOSIS — F17210 Nicotine dependence, cigarettes, uncomplicated: Secondary | ICD-10-CM | POA: Insufficient documentation

## 2016-08-14 DIAGNOSIS — Z7982 Long term (current) use of aspirin: Secondary | ICD-10-CM | POA: Insufficient documentation

## 2016-08-14 DIAGNOSIS — Z79899 Other long term (current) drug therapy: Secondary | ICD-10-CM | POA: Diagnosis not present

## 2016-08-14 DIAGNOSIS — F10120 Alcohol abuse with intoxication, uncomplicated: Secondary | ICD-10-CM | POA: Diagnosis not present

## 2016-08-14 DIAGNOSIS — Z794 Long term (current) use of insulin: Secondary | ICD-10-CM | POA: Diagnosis not present

## 2016-08-14 DIAGNOSIS — E119 Type 2 diabetes mellitus without complications: Secondary | ICD-10-CM | POA: Diagnosis not present

## 2016-08-14 DIAGNOSIS — I509 Heart failure, unspecified: Secondary | ICD-10-CM | POA: Insufficient documentation

## 2016-08-14 DIAGNOSIS — R4182 Altered mental status, unspecified: Secondary | ICD-10-CM | POA: Diagnosis present

## 2016-08-14 DIAGNOSIS — F10920 Alcohol use, unspecified with intoxication, uncomplicated: Secondary | ICD-10-CM

## 2016-08-14 DIAGNOSIS — I2510 Atherosclerotic heart disease of native coronary artery without angina pectoris: Secondary | ICD-10-CM | POA: Diagnosis not present

## 2016-08-14 LAB — RAPID URINE DRUG SCREEN, HOSP PERFORMED
AMPHETAMINES: NOT DETECTED
BARBITURATES: NOT DETECTED
BENZODIAZEPINES: NOT DETECTED
Cocaine: NOT DETECTED
Opiates: NOT DETECTED
TETRAHYDROCANNABINOL: NOT DETECTED

## 2016-08-14 LAB — CBC WITH DIFFERENTIAL/PLATELET
BASOS PCT: 1 %
Basophils Absolute: 0 10*3/uL (ref 0.0–0.1)
EOS ABS: 0.1 10*3/uL (ref 0.0–0.7)
EOS PCT: 3 %
HCT: 43.6 % (ref 39.0–52.0)
Hemoglobin: 15.5 g/dL (ref 13.0–17.0)
LYMPHS ABS: 1.3 10*3/uL (ref 0.7–4.0)
Lymphocytes Relative: 28 %
MCH: 28.9 pg (ref 26.0–34.0)
MCHC: 35.6 g/dL (ref 30.0–36.0)
MCV: 81.2 fL (ref 78.0–100.0)
MONOS PCT: 10 %
Monocytes Absolute: 0.5 10*3/uL (ref 0.1–1.0)
NEUTROS PCT: 58 %
Neutro Abs: 2.7 10*3/uL (ref 1.7–7.7)
PLATELETS: 283 10*3/uL (ref 150–400)
RBC: 5.37 MIL/uL (ref 4.22–5.81)
RDW: 13.7 % (ref 11.5–15.5)
WBC: 4.6 10*3/uL (ref 4.0–10.5)

## 2016-08-14 LAB — COMPREHENSIVE METABOLIC PANEL
ALK PHOS: 90 U/L (ref 38–126)
ALT: 19 U/L (ref 17–63)
AST: 18 U/L (ref 15–41)
Albumin: 3.7 g/dL (ref 3.5–5.0)
Anion gap: 15 (ref 5–15)
BUN: 6 mg/dL (ref 6–20)
CALCIUM: 8.8 mg/dL — AB (ref 8.9–10.3)
CHLORIDE: 95 mmol/L — AB (ref 101–111)
CO2: 22 mmol/L (ref 22–32)
CREATININE: 0.9 mg/dL (ref 0.61–1.24)
Glucose, Bld: 276 mg/dL — ABNORMAL HIGH (ref 65–99)
Potassium: 3.7 mmol/L (ref 3.5–5.1)
SODIUM: 132 mmol/L — AB (ref 135–145)
Total Bilirubin: 0.2 mg/dL — ABNORMAL LOW (ref 0.3–1.2)
Total Protein: 7.2 g/dL (ref 6.5–8.1)

## 2016-08-14 LAB — URINALYSIS, ROUTINE W REFLEX MICROSCOPIC
Bilirubin Urine: NEGATIVE
GLUCOSE, UA: 500 mg/dL — AB
Hgb urine dipstick: NEGATIVE
Ketones, ur: NEGATIVE mg/dL
Leukocytes, UA: NEGATIVE
NITRITE: NEGATIVE
PH: 5.5 (ref 5.0–8.0)
Protein, ur: NEGATIVE mg/dL

## 2016-08-14 LAB — TROPONIN I

## 2016-08-14 LAB — LACTIC ACID, PLASMA
Lactic Acid, Venous: 2.8 mmol/L (ref 0.5–1.9)
Lactic Acid, Venous: 3 mmol/L (ref 0.5–1.9)

## 2016-08-14 LAB — AMMONIA: Ammonia: 17 umol/L (ref 9–35)

## 2016-08-14 LAB — ACETAMINOPHEN LEVEL: Acetaminophen (Tylenol), Serum: <10 ug/mL — ABNORMAL LOW (ref 10–30)

## 2016-08-14 LAB — SALICYLATE LEVEL

## 2016-08-14 LAB — ETHANOL: ALCOHOL ETHYL (B): 211 mg/dL — AB (ref <5)

## 2016-08-14 MED ORDER — SODIUM CHLORIDE 0.9 % IV BOLUS (SEPSIS)
1000.0000 mL | Freq: Once | INTRAVENOUS | Status: AC
  Administered 2016-08-14: 1000 mL via INTRAVENOUS

## 2016-08-14 NOTE — ED Notes (Signed)
Critical Lab Lactic Acid given to Dr Hyacinth Meeker.  No further action needed.
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
## 2016-08-14 NOTE — ED Notes (Signed)
CRITICAL VALUE ALERT  Critical value received:  Lactic Acid = 2.8  Date of notification:  08/14/16  Time of notification:  1632  Critical value read back:Yes.    Nurse who received alert:  Antony Odea  MD notified (1st page):  Dr. Hyacinth Meeker  Time of first page:  1634  MD notified (2nd page):  Time of second page:  Responding MD:  Dr. Hyacinth Meeker  Time MD responded:  [phone]

## 2016-08-14 NOTE — ED Triage Notes (Addendum)
[facility] doctor. No smell of etoh. [facility] called [facility] for altered mental status. Pt alert upon ED arrival oriented to person and place. VVS. [id number]-[id number]. Pt reports he drank "too much" alcohol today. Reports he drank 2 bottles of red wine.

## 2016-08-14 NOTE — ED Notes (Signed)
CRITICAL VALUE ALERT  Critical value received:  Lactic Acid 3.0  Date of notification:  08/14/16  Time of notification:  1940  Critical value read back:Yes.    Nurse who received alert:  yw  MD notified (1st page):  B. Hyacinth Meeker   Time of first page:    MD notified (2nd page):  Time of second page:  Responding MD:    Time MD responded:  [id number]

## 2016-08-17 LAB — CBG MONITORING, ED: Glucose-Capillary: 268 mg/dL — ABNORMAL HIGH (ref 65–99)

## 2016-09-03 ENCOUNTER — Emergency Department (HOSPITAL_COMMUNITY): Payer: Medicare Other

## 2016-09-03 ENCOUNTER — Encounter (HOSPITAL_COMMUNITY): Payer: Self-pay

## 2016-09-03 ENCOUNTER — Inpatient Hospital Stay (HOSPITAL_COMMUNITY)
Admission: EM | Admit: 2016-09-03 | Discharge: 2016-09-14 | DRG: 637 | Disposition: A | Discharge status: Skilled Nursing Facility | Payer: Medicare Other | Attending: Internal Medicine | Admitting: Internal Medicine

## 2016-09-03 DIAGNOSIS — Z9114 Patient's other noncompliance with medication regimen: Secondary | ICD-10-CM | POA: Diagnosis not present

## 2016-09-03 DIAGNOSIS — I5032 Chronic diastolic (congestive) heart failure: Secondary | ICD-10-CM | POA: Diagnosis not present

## 2016-09-03 DIAGNOSIS — R778 Other specified abnormalities of plasma proteins: Secondary | ICD-10-CM

## 2016-09-03 DIAGNOSIS — Z8249 Family history of ischemic heart disease and other diseases of the circulatory system: Secondary | ICD-10-CM

## 2016-09-03 DIAGNOSIS — Z951 Presence of aortocoronary bypass graft: Secondary | ICD-10-CM

## 2016-09-03 DIAGNOSIS — E1111 Type 2 diabetes mellitus with ketoacidosis with coma: Principal | ICD-10-CM | POA: Diagnosis present

## 2016-09-03 DIAGNOSIS — Z7982 Long term (current) use of aspirin: Secondary | ICD-10-CM

## 2016-09-03 DIAGNOSIS — I472 Ventricular tachycardia: Secondary | ICD-10-CM | POA: Diagnosis present

## 2016-09-03 DIAGNOSIS — E8809 Other disorders of plasma-protein metabolism, not elsewhere classified: Secondary | ICD-10-CM | POA: Diagnosis present

## 2016-09-03 DIAGNOSIS — E0810 Diabetes mellitus due to underlying condition with ketoacidosis without coma: Secondary | ICD-10-CM | POA: Diagnosis not present

## 2016-09-03 DIAGNOSIS — K5900 Constipation, unspecified: Secondary | ICD-10-CM | POA: Diagnosis not present

## 2016-09-03 DIAGNOSIS — F17210 Nicotine dependence, cigarettes, uncomplicated: Secondary | ICD-10-CM | POA: Diagnosis not present

## 2016-09-03 DIAGNOSIS — E876 Hypokalemia: Secondary | ICD-10-CM | POA: Diagnosis not present

## 2016-09-03 DIAGNOSIS — Z955 Presence of coronary angioplasty implant and graft: Secondary | ICD-10-CM

## 2016-09-03 DIAGNOSIS — D649 Anemia, unspecified: Secondary | ICD-10-CM | POA: Diagnosis present

## 2016-09-03 DIAGNOSIS — I248 Other forms of acute ischemic heart disease: Secondary | ICD-10-CM | POA: Diagnosis present

## 2016-09-03 DIAGNOSIS — E861 Hypovolemia: Secondary | ICD-10-CM | POA: Diagnosis present

## 2016-09-03 DIAGNOSIS — R680 Hypothermia, not associated with low environmental temperature: Secondary | ICD-10-CM | POA: Diagnosis present

## 2016-09-03 DIAGNOSIS — I509 Heart failure, unspecified: Secondary | ICD-10-CM | POA: Diagnosis not present

## 2016-09-03 DIAGNOSIS — I2510 Atherosclerotic heart disease of native coronary artery without angina pectoris: Secondary | ICD-10-CM | POA: Diagnosis present

## 2016-09-03 DIAGNOSIS — I513 Intracardiac thrombosis, not elsewhere classified: Secondary | ICD-10-CM | POA: Diagnosis not present

## 2016-09-03 DIAGNOSIS — I255 Ischemic cardiomyopathy: Secondary | ICD-10-CM | POA: Diagnosis not present

## 2016-09-03 DIAGNOSIS — K219 Gastro-esophageal reflux disease without esophagitis: Secondary | ICD-10-CM | POA: Diagnosis present

## 2016-09-03 DIAGNOSIS — R7989 Other specified abnormal findings of blood chemistry: Secondary | ICD-10-CM

## 2016-09-03 DIAGNOSIS — E1310 Other specified diabetes mellitus with ketoacidosis without coma: Secondary | ICD-10-CM

## 2016-09-03 DIAGNOSIS — I2129 ST elevation (STEMI) myocardial infarction involving other sites: Secondary | ICD-10-CM | POA: Diagnosis not present

## 2016-09-03 DIAGNOSIS — I5042 Chronic combined systolic (congestive) and diastolic (congestive) heart failure: Secondary | ICD-10-CM | POA: Diagnosis present

## 2016-09-03 DIAGNOSIS — I10 Essential (primary) hypertension: Secondary | ICD-10-CM

## 2016-09-03 DIAGNOSIS — I5021 Acute systolic (congestive) heart failure: Secondary | ICD-10-CM | POA: Diagnosis not present

## 2016-09-03 DIAGNOSIS — I2582 Chronic total occlusion of coronary artery: Secondary | ICD-10-CM | POA: Diagnosis present

## 2016-09-03 DIAGNOSIS — N179 Acute kidney failure, unspecified: Secondary | ICD-10-CM

## 2016-09-03 DIAGNOSIS — J90 Pleural effusion, not elsewhere classified: Secondary | ICD-10-CM

## 2016-09-03 DIAGNOSIS — I25110 Atherosclerotic heart disease of native coronary artery with unstable angina pectoris: Secondary | ICD-10-CM | POA: Diagnosis not present

## 2016-09-03 DIAGNOSIS — R748 Abnormal levels of other serum enzymes: Secondary | ICD-10-CM | POA: Diagnosis not present

## 2016-09-03 DIAGNOSIS — Z789 Other specified health status: Secondary | ICD-10-CM | POA: Diagnosis not present

## 2016-09-03 DIAGNOSIS — Z811 Family history of alcohol abuse and dependence: Secondary | ICD-10-CM | POA: Diagnosis not present

## 2016-09-03 DIAGNOSIS — I110 Hypertensive heart disease with heart failure: Secondary | ICD-10-CM | POA: Diagnosis present

## 2016-09-03 DIAGNOSIS — E1110 Type 2 diabetes mellitus with ketoacidosis without coma: Secondary | ICD-10-CM | POA: Diagnosis present

## 2016-09-03 DIAGNOSIS — I219 Acute myocardial infarction, unspecified: Secondary | ICD-10-CM | POA: Diagnosis not present

## 2016-09-03 DIAGNOSIS — I959 Hypotension, unspecified: Secondary | ICD-10-CM | POA: Diagnosis present

## 2016-09-03 DIAGNOSIS — I471 Supraventricular tachycardia: Secondary | ICD-10-CM | POA: Diagnosis present

## 2016-09-03 DIAGNOSIS — I214 Non-ST elevation (NSTEMI) myocardial infarction: Secondary | ICD-10-CM

## 2016-09-03 DIAGNOSIS — E11649 Type 2 diabetes mellitus with hypoglycemia without coma: Secondary | ICD-10-CM | POA: Diagnosis present

## 2016-09-03 DIAGNOSIS — R9431 Abnormal electrocardiogram [ECG] [EKG]: Secondary | ICD-10-CM | POA: Diagnosis not present

## 2016-09-03 DIAGNOSIS — G4710 Hypersomnia, unspecified: Secondary | ICD-10-CM | POA: Diagnosis present

## 2016-09-03 DIAGNOSIS — F1010 Alcohol abuse, uncomplicated: Secondary | ICD-10-CM | POA: Diagnosis not present

## 2016-09-03 DIAGNOSIS — I236 Thrombosis of atrium, auricular appendage, and ventricle as current complications following acute myocardial infarction: Secondary | ICD-10-CM | POA: Clinically undetermined

## 2016-09-03 DIAGNOSIS — Z9049 Acquired absence of other specified parts of digestive tract: Secondary | ICD-10-CM

## 2016-09-03 DIAGNOSIS — R079 Chest pain, unspecified: Secondary | ICD-10-CM | POA: Diagnosis not present

## 2016-09-03 DIAGNOSIS — E860 Dehydration: Secondary | ICD-10-CM

## 2016-09-03 DIAGNOSIS — Z79899 Other long term (current) drug therapy: Secondary | ICD-10-CM

## 2016-09-03 DIAGNOSIS — E1311 Other specified diabetes mellitus with ketoacidosis with coma: Secondary | ICD-10-CM | POA: Diagnosis not present

## 2016-09-03 DIAGNOSIS — R0781 Pleurodynia: Secondary | ICD-10-CM

## 2016-09-03 DIAGNOSIS — Z794 Long term (current) use of insulin: Secondary | ICD-10-CM | POA: Diagnosis not present

## 2016-09-03 DIAGNOSIS — T68XXXA Hypothermia, initial encounter: Secondary | ICD-10-CM

## 2016-09-03 DIAGNOSIS — R0602 Shortness of breath: Secondary | ICD-10-CM

## 2016-09-03 DIAGNOSIS — G9340 Encephalopathy, unspecified: Secondary | ICD-10-CM | POA: Diagnosis present

## 2016-09-03 DIAGNOSIS — I420 Dilated cardiomyopathy: Secondary | ICD-10-CM | POA: Diagnosis present

## 2016-09-03 DIAGNOSIS — Z8711 Personal history of peptic ulcer disease: Secondary | ICD-10-CM

## 2016-09-03 DIAGNOSIS — R1031 Right lower quadrant pain: Secondary | ICD-10-CM

## 2016-09-03 DIAGNOSIS — N170 Acute kidney failure with tubular necrosis: Secondary | ICD-10-CM | POA: Diagnosis not present

## 2016-09-03 DIAGNOSIS — R4182 Altered mental status, unspecified: Secondary | ICD-10-CM | POA: Diagnosis present

## 2016-09-03 DIAGNOSIS — I4729 Other ventricular tachycardia: Secondary | ICD-10-CM

## 2016-09-03 DIAGNOSIS — E783 Hyperchylomicronemia: Secondary | ICD-10-CM

## 2016-09-03 DIAGNOSIS — J9819 Other pulmonary collapse: Secondary | ICD-10-CM

## 2016-09-03 LAB — GLUCOSE, CAPILLARY
GLUCOSE-CAPILLARY: 566 mg/dL — AB (ref 65–99)
GLUCOSE-CAPILLARY: 491 mg/dL — AB (ref 65–99)
Glucose-Capillary: >600 mg/dL (ref 65–99)
Glucose-Capillary: >600 mg/dL (ref 65–99)
Glucose-Capillary: >600 mg/dL (ref 65–99)
Glucose-Capillary: >600 mg/dL (ref 65–99)

## 2016-09-03 LAB — BLOOD GAS, ARTERIAL
Drawn by: 277331
O2 Content: 3 L/min
O2 Saturation: 97.2 %
Patient temperature: 37
pH, Arterial: 7.066 — CL (ref 7.350–7.450)
pO2, Arterial: 165 mmHg — ABNORMAL HIGH (ref 83.0–108.0)

## 2016-09-03 LAB — URINALYSIS, ROUTINE W REFLEX MICROSCOPIC
Glucose, UA: >1000 mg/dL — AB
Leukocytes, UA: NEGATIVE
Nitrite: NEGATIVE
Protein, ur: NEGATIVE mg/dL
Specific Gravity, Urine: 1.015 (ref 1.005–1.030)
pH: 5 (ref 5.0–8.0)

## 2016-09-03 LAB — COMPREHENSIVE METABOLIC PANEL
ALT: 24 U/L (ref 17–63)
AST: 18 U/L (ref 15–41)
Albumin: 2.3 g/dL — ABNORMAL LOW (ref 3.5–5.0)
Alkaline Phosphatase: 121 U/L (ref 38–126)
BUN: 103 mg/dL — ABNORMAL HIGH (ref 6–20)
CO2: <7 mmol/L — ABNORMAL LOW (ref 22–32)
Calcium: 8.1 mg/dL — ABNORMAL LOW (ref 8.9–10.3)
Chloride: 72 mmol/L — ABNORMAL LOW (ref 101–111)
Creatinine, Ser: 4.82 mg/dL — ABNORMAL HIGH (ref 0.61–1.24)
GFR calc Af Amer: 13 mL/min — ABNORMAL LOW (ref >60)
GFR calc non Af Amer: 11 mL/min — ABNORMAL LOW (ref >60)
Glucose, Bld: 1352 mg/dL (ref 65–99)
Potassium: 4.5 mmol/L (ref 3.5–5.1)
Sodium: 117 mmol/L — CL (ref 135–145)
Total Bilirubin: 1.9 mg/dL — ABNORMAL HIGH (ref 0.3–1.2)
Total Protein: 5.1 g/dL — ABNORMAL LOW (ref 6.5–8.1)

## 2016-09-03 LAB — BASIC METABOLIC PANEL
Anion gap: 22 — ABNORMAL HIGH (ref 5–15)
Anion gap: 17 — ABNORMAL HIGH (ref 5–15)
Anion gap: 10 (ref 5–15)
BUN: 92 mg/dL — AB (ref 6–20)
BUN: 90 mg/dL — AB (ref 6–20)
BUN: 86 mg/dL — AB (ref 6–20)
CHLORIDE: 88 mmol/L — AB (ref 101–111)
CHLORIDE: 89 mmol/L — AB (ref 101–111)
CHLORIDE: 94 mmol/L — AB (ref 101–111)
CO2: 15 mmol/L — ABNORMAL LOW (ref 22–32)
CO2: 21 mmol/L — AB (ref 22–32)
CO2: 25 mmol/L (ref 22–32)
CREATININE: 4.72 mg/dL — AB (ref 0.61–1.24)
CREATININE: 4.36 mg/dL — AB (ref 0.61–1.24)
CREATININE: 4.14 mg/dL — AB (ref 0.61–1.24)
Calcium: 7.2 mg/dL — ABNORMAL LOW (ref 8.9–10.3)
Calcium: 7.4 mg/dL — ABNORMAL LOW (ref 8.9–10.3)
Calcium: 7.6 mg/dL — ABNORMAL LOW (ref 8.9–10.3)
GFR calc Af Amer: 13 mL/min — ABNORMAL LOW (ref >60)
GFR calc Af Amer: 15 mL/min — ABNORMAL LOW (ref >60)
GFR calc Af Amer: 16 mL/min — ABNORMAL LOW (ref >60)
GFR calc non Af Amer: 11 mL/min — ABNORMAL LOW (ref >60)
GFR calc non Af Amer: 13 mL/min — ABNORMAL LOW (ref >60)
GFR calc non Af Amer: 13 mL/min — ABNORMAL LOW (ref >60)
GLUCOSE: 506 mg/dL — AB (ref 65–99)
Glucose, Bld: 960 mg/dL (ref 65–99)
Glucose, Bld: 781 mg/dL (ref 65–99)
Potassium: 3.9 mmol/L (ref 3.5–5.1)
Potassium: 3.5 mmol/L (ref 3.5–5.1)
Potassium: 3.4 mmol/L — ABNORMAL LOW (ref 3.5–5.1)
SODIUM: 125 mmol/L — AB (ref 135–145)
SODIUM: 127 mmol/L — AB (ref 135–145)
SODIUM: 129 mmol/L — AB (ref 135–145)

## 2016-09-03 LAB — BETA-HYDROXYBUTYRIC ACID: Beta-Hydroxybutyric Acid: 7.56 mmol/L — ABNORMAL HIGH (ref 0.05–0.27)

## 2016-09-03 LAB — RAPID URINE DRUG SCREEN, HOSP PERFORMED
AMPHETAMINES: NOT DETECTED
Amphetamines: NOT DETECTED
BENZODIAZEPINES: NOT DETECTED
Barbiturates: NOT DETECTED
Barbiturates: NOT DETECTED
Benzodiazepines: NOT DETECTED
COCAINE: NOT DETECTED
Cocaine: NOT DETECTED
OPIATES: NOT DETECTED
Opiates: NOT DETECTED
TETRAHYDROCANNABINOL: NOT DETECTED
Tetrahydrocannabinol: NOT DETECTED

## 2016-09-03 LAB — LACTIC ACID, PLASMA
Lactic Acid, Venous: 3.7 mmol/L (ref 0.5–1.9)
Lactic Acid, Venous: 2.9 mmol/L (ref 0.5–1.9)

## 2016-09-03 LAB — CBC WITH DIFFERENTIAL/PLATELET
Basophils Absolute: 0 10*3/uL (ref 0.0–0.1)
Basophils Relative: 0 %
Eosinophils Absolute: 0 10*3/uL (ref 0.0–0.7)
Eosinophils Relative: 0 %
HCT: 37.5 % — ABNORMAL LOW (ref 39.0–52.0)
Hemoglobin: 12.8 g/dL — ABNORMAL LOW (ref 13.0–17.0)
Lymphocytes Relative: 5 %
Lymphs Abs: 0.5 10*3/uL — ABNORMAL LOW (ref 0.7–4.0)
MCH: 28.4 pg (ref 26.0–34.0)
MCHC: 30.7 g/dL (ref 30.0–36.0)
MCV: 93.3 fL (ref 78.0–100.0)
Monocytes Absolute: 0.7 10*3/uL (ref 0.1–1.0)
Monocytes Relative: 7 %
Neutro Abs: 8.5 10*3/uL — ABNORMAL HIGH (ref 1.7–7.7)
Neutrophils Relative %: 88 %
Platelets: 245 10*3/uL (ref 150–400)
RBC: 4.5 MIL/uL (ref 4.22–5.81)
RDW: 15.2 % (ref 11.5–15.5)
WBC: 9.7 10*3/uL (ref 4.0–10.5)

## 2016-09-03 LAB — MRSA PCR SCREENING: MRSA by PCR: NEGATIVE

## 2016-09-03 LAB — TROPONIN I
Troponin I: 0.07 ng/mL (ref <0.03)
Troponin I: 0.52 ng/mL (ref <0.03)
Troponin I: 1.14 ng/mL (ref <0.03)

## 2016-09-03 LAB — ETHANOL: Alcohol, Ethyl (B): <5 mg/dL (ref <5)

## 2016-09-03 LAB — POCT I-STAT 3, ART BLOOD GAS (G3+)
ACID-BASE DEFICIT: 17 mmol/L — AB (ref 0.0–2.0)
Bicarbonate: 9.2 mmol/L — ABNORMAL LOW (ref 20.0–28.0)
O2 Saturation: 98 %
PCO2 ART: 23.4 mmHg — AB (ref 32.0–48.0)
PH ART: 7.203 — AB (ref 7.350–7.450)
Patient temperature: 98.6
TCO2: 10 mmol/L (ref 0–100)
pO2, Arterial: 127 mmHg — ABNORMAL HIGH (ref 83.0–108.0)

## 2016-09-03 LAB — URINE MICROSCOPIC-ADD ON
Squamous Epithelial / LPF: NONE SEEN
WBC, UA: NONE SEEN WBC/hpf (ref 0–5)

## 2016-09-03 LAB — CBG MONITORING, ED: Glucose-Capillary: >600 mg/dL (ref 65–99)

## 2016-09-03 LAB — MAGNESIUM: Magnesium: 2.8 mg/dL — ABNORMAL HIGH (ref 1.7–2.4)

## 2016-09-03 MED ORDER — FOLIC ACID 5 MG/ML IJ SOLN
1.0000 mg | Freq: Every day | INTRAMUSCULAR | Status: DC
  Administered 2016-09-03 – 2016-09-04 (×2): 1 mg via INTRAVENOUS
  Filled 2016-09-03 (×3): qty 0.2

## 2016-09-03 MED ORDER — SODIUM BICARBONATE 8.4 % IV SOLN
50.0000 meq | Freq: Once | INTRAVENOUS | Status: AC
  Administered 2016-09-03: 50 meq via INTRAVENOUS

## 2016-09-03 MED ORDER — SODIUM CHLORIDE 0.9 % IV SOLN
INTRAVENOUS | Status: DC
  Administered 2016-09-04: 01:00:00 via INTRAVENOUS
  Filled 2016-09-03 (×2): qty 2.5

## 2016-09-03 MED ORDER — SODIUM CHLORIDE 0.9 % IV SOLN: INTRAVENOUS | Status: AC

## 2016-09-03 MED ORDER — THIAMINE HCL 100 MG/ML IJ SOLN
100.0000 mg | Freq: Every day | INTRAMUSCULAR | Status: DC
  Administered 2016-09-03 – 2016-09-04 (×2): 100 mg via INTRAVENOUS
  Filled 2016-09-03 (×2): qty 1

## 2016-09-03 MED ORDER — POTASSIUM CHLORIDE 10 MEQ/100ML IV SOLN
10.0000 meq | INTRAVENOUS | Status: AC
  Administered 2016-09-03 (×2): 10 meq via INTRAVENOUS
  Filled 2016-09-03 (×2): qty 100

## 2016-09-03 MED ORDER — METOPROLOL TARTRATE 5 MG/5ML IV SOLN
2.5000 mg | Freq: Four times a day (QID) | INTRAVENOUS | Status: DC
  Administered 2016-09-03 – 2016-09-07 (×16): 2.5 mg via INTRAVENOUS
  Filled 2016-09-03 (×20): qty 5

## 2016-09-03 MED ORDER — SODIUM CHLORIDE 0.9 % IV SOLN: INTRAVENOUS | Status: DC

## 2016-09-03 MED ORDER — DEXTROSE 50 % IV SOLN: 25.0000 mL | INTRAVENOUS | Status: DC | PRN

## 2016-09-03 MED ORDER — SODIUM CHLORIDE 0.9 % IV SOLN
1.0000 g | Freq: Once | INTRAVENOUS | Status: AC
  Administered 2016-09-03: 1 g via INTRAVENOUS
  Filled 2016-09-03: qty 10

## 2016-09-03 MED ORDER — PIPERACILLIN-TAZOBACTAM 3.375 G IVPB 30 MIN
3.3750 g | Freq: Once | INTRAVENOUS | Status: AC
  Administered 2016-09-03: 3.375 g via INTRAVENOUS
  Filled 2016-09-03: qty 50

## 2016-09-03 MED ORDER — SODIUM BICARBONATE 8.4 % IV SOLN
INTRAVENOUS | Status: AC
  Filled 2016-09-03: qty 50

## 2016-09-03 MED ORDER — VANCOMYCIN HCL IN DEXTROSE 1-5 GM/200ML-% IV SOLN
1000.0000 mg | Freq: Once | INTRAVENOUS | Status: AC
  Administered 2016-09-03: 1000 mg via INTRAVENOUS
  Filled 2016-09-03: qty 200

## 2016-09-03 MED ORDER — INSULIN ASPART 100 UNIT/ML ~~LOC~~ SOLN
SUBCUTANEOUS | Status: AC
  Filled 2016-09-03: qty 1

## 2016-09-03 MED ORDER — DEXTROSE-NACL 5-0.45 % IV SOLN
INTRAVENOUS | Status: DC
  Administered 2016-09-04 – 2016-09-05 (×4): via INTRAVENOUS
  Administered 2016-09-06: 75 mL/h via INTRAVENOUS
  Administered 2016-09-06 – 2016-09-08 (×3): via INTRAVENOUS

## 2016-09-03 MED ORDER — SODIUM CHLORIDE 0.9 % IV SOLN
INTRAVENOUS | Status: DC
  Administered 2016-09-03: 5.4 [IU]/h via INTRAVENOUS
  Filled 2016-09-03: qty 2.5

## 2016-09-03 MED ORDER — CALCIUM CHLORIDE 10 % IV SOLN
INTRAVENOUS | Status: AC
  Filled 2016-09-03: qty 10

## 2016-09-03 MED ORDER — HEPARIN (PORCINE) IN NACL 100-0.45 UNIT/ML-% IJ SOLN
850.0000 [IU]/h | INTRAMUSCULAR | Status: DC
  Administered 2016-09-03: 1000 [IU]/h via INTRAVENOUS
  Filled 2016-09-03 (×4): qty 250

## 2016-09-03 MED ORDER — ASPIRIN 300 MG RE SUPP
150.0000 mg | Freq: Every day | RECTAL | Status: DC
  Administered 2016-09-03 – 2016-09-05 (×3): 150 mg via RECTAL
  Filled 2016-09-03 (×4): qty 1

## 2016-09-03 MED ORDER — SODIUM CHLORIDE 0.9 % IV BOLUS (SEPSIS)
2000.0000 mL | Freq: Once | INTRAVENOUS | Status: AC
  Administered 2016-09-03: 200 mL via INTRAVENOUS

## 2016-09-03 MED ORDER — HEPARIN SODIUM (PORCINE) 5000 UNIT/ML IJ SOLN
5000.0000 [IU] | Freq: Three times a day (TID) | INTRAMUSCULAR | Status: DC
  Administered 2016-09-03: 5000 [IU] via SUBCUTANEOUS
  Filled 2016-09-03 (×2): qty 1

## 2016-09-03 MED ORDER — SODIUM CHLORIDE 0.9 % IV BOLUS (SEPSIS)
3000.0000 mL | Freq: Once | INTRAVENOUS | Status: AC
  Administered 2016-09-03: 3000 mL via INTRAVENOUS

## 2016-09-03 MED ORDER — DEXTROSE-NACL 5-0.45 % IV SOLN: INTRAVENOUS | Status: DC

## 2016-09-03 MED ORDER — ASPIRIN EC 81 MG PO TBEC
81.0000 mg | DELAYED_RELEASE_TABLET | Freq: Every day | ORAL | Status: DC
  Filled 2016-09-03: qty 1

## 2016-09-03 MED ORDER — INSULIN ASPART 100 UNIT/ML IV SOLN
10.0000 [IU] | Freq: Once | INTRAVENOUS | Status: AC
  Administered 2016-09-03: 10 [IU] via INTRAVENOUS

## 2016-09-03 MED ORDER — SODIUM CHLORIDE 0.9 % IV SOLN
INTRAVENOUS | Status: DC
  Administered 2016-09-03 (×2): via INTRAVENOUS

## 2016-09-03 NOTE — Progress Notes (Signed)
CRITICAL VALUE ALERT  Critical value received:  Glucose 781  Date of notification:  09/03/16  Time of notification:  2027  Critical value read back:Yes.    Nurse who received alert:  Judeth Cornfield, RN  MD notified (1st page):  Sommers  Time of first page:  2027  MD notified (2nd page):  Time of second page:  Responding MD:  Dellie Catholic  Time MD responded:  2027

## 2016-09-03 NOTE — Progress Notes (Addendum)
ANTICOAGULATION CONSULT NOTE - Initial Consult  Pharmacy Consult for heparin Indication: chest pain/ACS  No Known Allergies  Patient Measurements: Height: 5\' 10"  (177.8 cm) Weight: 171 lb 15.3 oz (78 kg) IBW/kg (Calculated) : 73 Heparin Dosing Weight: 78 kg  Vital Signs: Temp: 97.9 F (36.6 C) (10/05 1602) Temp Source: Core (Comment) (10/05 1602) BP: 136/57 (10/05 1500) Pulse Rate: 108 (10/05 1500)  Labs:  Recent Labs  09/03/16 0926 09/03/16 1445  HGB 12.8*  --   HCT 37.5*  --   PLT 245  --   CREATININE 4.82* 4.72*  TROPONINI 0.07* 0.52*    Estimated Creatinine Clearance: 15.3 mL/min (by C-G formula based on SCr of 4.72 mg/dL (H)).   Medical History: Past Medical History:  Diagnosis Date  . Arthritis   . CAD (coronary artery disease)    LIMA to LAD 2002, DES to circumflex and RCA 2012  . Carotid artery stenosis    Moderate bilateral disease  . CHF (congestive heart failure) (HCC)   . Essential hypertension   . GERD (gastroesophageal reflux disease)   . History of substance abuse    Alcohol and cocaine  . Hyperlipidemia   . Peptic ulcer disease   . Seasonal allergies   . Stomach ulcer 1972   Antrectomy  . Type 2 diabetes mellitus (HCC)     Medications:  Scheduled:  . aspirin  150 mg Rectal Daily  . calcium chloride      . folic acid  1 mg Intravenous Daily  . insulin aspart      . metoprolol  2.5 mg Intravenous Q6H  . sodium bicarbonate      . thiamine  100 mg Intravenous Daily   Infusions:  . sodium chloride 150 mL/hr at 09/03/16 1436  . dextrose 5 % and 0.45% NaCl    . insulin (NOVOLIN-R) infusion 21.6 Units/hr (09/03/16 1603)    Assessment: 70 yo male with ACS will be transitioned from SQ heparin to IV heparin.  Last dose of SQ heparin was at 1453 today.  Goal of Therapy:  Heparin level 0.3-0.7 units/ml Monitor platelets by anticoagulation protocol: Yes   Plan:  - d/c SQ heparin - Start heparin drip at 1000 units/hr. No bolus - 8 hr  heparin level  - daily heparin level and CBC  Eliyahu Bille, Tsz-Yin 09/03/2016,4:40 PM
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
## 2016-09-03 NOTE — H&P (Signed)
PULMONARY / CRITICAL CARE MEDICINE   Name: Aaron Santiago MRN: [id number] DOB: Jul 31, 1946    ADMISSION DATE:  09/03/2016 CONSULTATION DATE:  09/03/16  REFERRING MD:  [facility]  CHIEF COMPLAINT:  AMS  HISTORY OF PRESENT ILLNESS:  Pt is encephelopathic; therefore, this HPI is obtained from chart review. Aaron Santiago is a 70 y.o. male with PMH as outlined below including but not limited to DM for which he has been non-compliant with meds (reportedly stopped taking 1 month ago).  He presented to [facility] ED on 10/5 via EMS due to AMS.  He apparently lives with his sister who was the one who noticed his decreased / worsening level of consciousness.  He apparently has heavy ETOH hx but stopped drinking 3 days prior to presentation.  In ED, labs were suggestive of DKA vs HONK.  He was felt to be too unstable; therefore, was transferred to [facility] ICU for further evaluation and management.  PAST MEDICAL HISTORY :  He  has a past medical history of Arthritis; CAD (coronary artery disease); Carotid artery stenosis; CHF (congestive heart failure) (HCC); Essential hypertension; GERD (gastroesophageal reflux disease); History of substance abuse; Hyperlipidemia; Peptic ulcer disease; Seasonal allergies; Stomach ulcer (1972); and Type 2 diabetes mellitus (HCC).  PAST SURGICAL HISTORY: He  has a past surgical history that includes Antrectomy (~1972); Cholecystectomy (06/2010); Fracture surgery; Femur fracture surgery (~ 1964); Coronary artery bypass graft (2002); Coronary angioplasty with stent (11/06/11); Knee arthroscopy with medial menisectomy (Right, 06/06/2013); left heart catheterization with coronary angiogram (N/A, 11/06/2011); and percutaneous coronary stent intervention (pci-s) (N/A, 11/06/2011).  No Known Allergies  No current facility-administered medications on file prior to encounter.    Current Outpatient Prescriptions on File Prior to Encounter  Medication Sig  . amLODipine (NORVASC) 5 MG tablet Take 1  tablet (5 mg total) by mouth daily.  Marland Kitchen aspirin EC 81 MG tablet Take 1 tablet (81 mg total) by mouth daily.  . carvedilol (COREG) 12.5 MG tablet TAKE ONE TABLET BY MOUTH TWICE DAILY  . celecoxib (CELEBREX) 200 MG capsule Take 200 mg by mouth daily.  Marland Kitchen DEXILANT 60 MG capsule Take 1 capsule (60 mg total) by mouth every morning.  . escitalopram (LEXAPRO) 20 MG tablet Take 20 mg by mouth daily. Take daily in the evening  . fluticasone (FLONASE) 50 MCG/ACT nasal spray Place 2 sprays into both nostrils at bedtime.   . gabapentin (NEURONTIN) 400 MG capsule Take 400-800 mg by mouth 3 (three) times daily. 1 in the morning and in the evening and 2 at bedtime  . hydroxypropyl methylcellulose (ISOPTO TEARS) 2.5 % ophthalmic solution Place 1 drop into both eyes 4 (four) times daily as needed for dry eyes.   . insulin detemir (LEVEMIR) 100 UNIT/ML injection Inject 0.4 mLs (40 Units total) into the skin 1 day or 1 dose. Before breakfast (Patient taking differently: Inject 25 Units into the skin every morning. Before breakfast)  . LORazepam (ATIVAN) 1 MG tablet Take 1 mg by mouth every 6 (six) hours as needed for anxiety. Take at bedtime  . losartan (COZAAR) 50 MG tablet Take 1 tablet (50 mg total) by mouth daily.  . meloxicam (MOBIC) 7.5 MG tablet Take 7.5 mg by mouth 2 (two) times daily.  . Multiple Vitamin (MULTIVITAMIN WITH MINERALS) TABS tablet Take 1 tablet by mouth daily.  . nitroGLYCERIN (NITROSTAT) 0.4 MG SL tablet Place 1 tablet (0.4 mg total) under the tongue every 5 (five) minutes as needed for chest pain. (up  to 3 doses)  . Omega-3 Fatty Acids (FISH OIL) 1200 MG CAPS Take 1 capsule (1,200 mg total) by mouth daily.  . SitaGLIPtin-MetFORMIN HCl (JANUMET XR) 50-1000 MG TB24 Take 1 tablet by mouth 2 (two) times daily.    FAMILY HISTORY:  His indicated that his mother is alive. He indicated that his father is deceased. He indicated that his sister is alive. He indicated that his brother is deceased.     SOCIAL HISTORY: He  reports that he has been smoking Cigarettes.  He has a 27.50 pack-year smoking history. He has never used smokeless tobacco. He reports that he drinks alcohol. He reports that he uses drugs, including Hydrocodone, Marijuana, and Cocaine.  REVIEW OF SYSTEMS:   Unable to obtain as pt is encephalopathic.  SUBJECTIVE:  Hypersomnolent, but does awaken to noxious stimuli and able to tell me his name and year.  VITAL SIGNS: BP 103/58   Pulse 99   Temp 98 F (36.7 C) (Oral)   Resp 15   Ht 5\' 10"  (1.778 m)   Wt 78 kg (171 lb 15.3 oz)   SpO2 99%   BMI 24.67 kg/m   HEMODYNAMICS:    VENTILATOR SETTINGS:    INTAKE / OUTPUT: No intake/output data recorded.   PHYSICAL EXAMINATION: General: Adult male, resting in bed, in NAD. Neuro: Hypersomnolent but able to be aroused by noxious stimuli.  MAE's.  Able to tell me his name and the year. HEENT: [place]/AT. PERRL, sclerae anicteric. MM very dry. Cardiovascular: RRR, no M/R/G.  Lungs: Respirations even and unlabored.  CTA bilaterally, No W/R/R. Abdomen: BS x 4, soft, NT/ND.  Musculoskeletal: No gross deformities, no edema.  Skin: Intact, warm, no rashes.    LABS:  BMET  Recent Labs Lab 09/03/16 0926  NA 117*  K 4.5  CL 72*  CO2 <7*  BUN 103*  CREATININE 4.82*  GLUCOSE 1,352*    Electrolytes  Recent Labs Lab 09/03/16 0926  CALCIUM 8.1*  MG 2.8*    CBC  Recent Labs Lab 09/03/16 0926  WBC 9.7  HGB 12.8*  HCT 37.5*  PLT 245    Coag's No results for input(s): APTT, INR in the last 168 hours.  Sepsis Markers  Recent Labs Lab 09/03/16 0927  LATICACIDVEN 3.7*    ABG  Recent Labs Lab 09/03/16 0853 09/03/16 1324  PHART 7.066* 7.203*  PCO2ART CRITICAL RESULT CALLED TO, READ BACK BY AND VERIFIED WITH: 23.4*  PO2ART 165* 127.0*    Liver Enzymes  Recent Labs Lab 09/03/16 0926  AST 18  ALT 24  ALKPHOS 121  BILITOT 1.9*  ALBUMIN 2.3*    Cardiac Enzymes  Recent  Labs Lab 09/03/16 0926  TROPONINI 0.07*    Glucose  Recent Labs Lab 09/03/16 0843 09/03/16 1040 09/03/16 1148 09/03/16 1323  GLUCAP >600* >600* >600* >600*    Imaging Dg Chest Portable 1 View  Result Date: 09/03/2016 CLINICAL DATA:  AMS/cad/chf/htn/smoker/hx heart cath/by pass surgery EXAM: PORTABLE CHEST 1 VIEW COMPARISON:  12/02/2015 FINDINGS: Shallow lung inflation. Heart size is probably normal. No focal consolidations or pleural effusions. Remote rib fractures. IMPRESSION: No evidence for acute  abnormality. Electronically Signed   By: Norva Pavlov M.D.   On: 09/03/2016 11:26     STUDIES:  CXR 10/5 >  CULTURES: Blood 10/5 > Urine 10/5 >  ANTIBIOTICS: None.  SIGNIFICANT EVENTS: 10/5 > admitted with DKA vs HONK.  LINES/TUBES: R femoral CVL 10/5 >  DISCUSSION: 71 y.o. male with hx DM, non-compliant  with outpatient meds x 1 month, presented to [facility] ED 10/5 with AMS. Found to have DKA vs HONK; [facility] for further evaluation and management.  ASSESSMENT / PLAN:  ENDOCRINE A:   DKA vs HONK. M - non-compliant with meds x 1 month per report.   P:   Fluids / insulin per DKA protocol. Needs education regarding medication compliance as outpatient. Hold preadmission levemir, janumet.  PULMONARY A: At risk for intubation if mental status worsens. Tobacco dependence. P:   Monitor respiratory status closely; low threshold for intubation. Pulmonary hygiene. Tobacco cessation counseling once mental status improves.  CARDIOVASCULAR A:  Troponin leak - suspect due to demand. Hx HTN, HLD, CHF (echo from 2012 with EF 60%, G1DD), CAD. P:  Monitor hemodynamics. Trend troponin / lactate. Continue preadmission ASA. Hold preadmission amlodipine, carvedilol, losartan, nitro.  RENAL A:   Pseudohyponatremia - due to hyperglycemia.  Corrects to 137. AGMA - due to DKA + uremia. AKI - presumed pre-renal due to hypovolemia. Pseudohypocalcemia - corrects  to 9.4. P:   Fluids / insulin per DKA protocol. Assess ionized calcium. BMP in AM.  GASTROINTESTINAL A:   Nutrition. HX PUD. P:   NPO.  HEMATOLOGIC A:   VTE Prophylaxis. P:  SCD's / heparin. CBC in AM.  INFECTIOUS A:   No indication of infection. P:   Defer abx for now. Follow cultures as above. Assess PCT - if high; then will start empiric abx.  NEUROLOGIC A:   Acute encephalopathy - due to DKA. Hx substance abuse (ETOH and cocaine). P:   Avoid sedating meds. Thiamine / Folate. Assess UDS. Substance abuse counseling. Hold preadmssion escitalopram, gabapentin, lorazepam.  Family updated: None available.  Interdisciplinary Family Meeting v Palliative Care Meeting:  Due by: 09/10/16.  CC time: 30 minutes.   Rutherford Guysahul Desai, [place] - [facility] [place] [facility] Pager: [phone]  or [phone] 09/03/2016, 2:31 PM   Attending Note:  I have examined patient, reviewed labs, studies and notes. I have discussed the case with Ihor Dow Desai, and I agree with the data and plans as amended above. 70 yo man with DM and EtOH abuse, off meds for about 1 month, presented to [facility] with severe encephalopathy. Found to have metabolic disarray most consistent with HONC and resultant acute renal failure, was started on IVF and insulin. [facility]. On my eval he is very sleepy, will rouse when stimulated, briefly interacts and then back to sleep. Comfortable resp pattern without crackles. Hemodynamically stable. Follow up ABG shows improvement in pH to 7.20. He appears to be protecting his airway. we will plan to continue aggressive IVF, insulin, correct lytes as needed. Follow renal fxn and BP - suspect both will improve with treatment underlying HONC. Anticipate MS will improve as well although note that he is at high risk for EtOH withdrawal, will need surveillance for this.  Independent critical care time is 45 minutes.   Levy Pupaobert Francia Verry, MD,  PhD 09/03/2016, 2:31 PM [place] [facility] [phone] or if no answer [phone]

## 2016-09-03 NOTE — ED Notes (Signed)
[employer] placed on pt.

## 2016-09-03 NOTE — Consult Note (Signed)
Referring Physician:  DHRUVA ORNDOFF is an 70 y.o. male.                       Chief Complaint: Abnormal Troponin-I in patient with DKA and known CAD, CABG and s/p stent in RCA and LCX  HPI: Patient brought to ICU with h/o of decreased level of consciousness as witnessed by his sister. Patient unable to give any history. Blood work showed minimal elevation of troponin-I, markedly elevated blood sugars and BUN of 90 to 92 and creatinine of 4.36 to 4.72.  Past Medical History:  Diagnosis Date  . Arthritis   . CAD (coronary artery disease)    LIMA to LAD 2002, DES to circumflex and RCA 2012  . Carotid artery stenosis    Moderate bilateral disease  . CHF (congestive heart failure) ([place])   . Essential hypertension   . GERD (gastroesophageal reflux disease)   . History of substance abuse    Alcohol and cocaine  . Hyperlipidemia   . Peptic ulcer disease   . Seasonal allergies   . Stomach ulcer 1972   Antrectomy  . Type 2 diabetes mellitus ([place])       Past Surgical History:  Procedure Laterality Date  . ANTRECTOMY  [id number]  . CHOLECYSTECTOMY  06/2010  . CORONARY ANGIOPLASTY WITH STENT PLACEMENT  11/06/11   2 DES to mid LCX and proximal RCA. 60% LAD stenosis after LIMA anastomosis  . CORONARY ARTERY BYPASS GRAFT  2002   CABG X1  . FEMUR FRACTURE SURGERY  ~ 1964   left  . FRACTURE SURGERY    . KNEE ARTHROSCOPY WITH MEDIAL MENISECTOMY Right 06/06/2013   Procedure: RIGHT KNEE ARTHROSCOPY WITH PARTIAL MEDIAL MENISCECTOMY;  Surgeon: Rozanna Box, MD;  Location: [facility];  Service: Orthopedics;  Laterality: Right;  . LEFT HEART CATHETERIZATION WITH CORONARY ANGIOGRAM N/A 11/06/2011   Procedure: LEFT HEART CATHETERIZATION WITH CORONARY ANGIOGRAM;  Surgeon: Wellington Hampshire, MD;  Location: [facility] CATH LAB;  Service: Cardiovascular;  Laterality: N/A;  . PERCUTANEOUS CORONARY STENT INTERVENTION (PCI-S) N/A 11/06/2011   Procedure: PERCUTANEOUS CORONARY STENT INTERVENTION (PCI-S);  Surgeon: Wellington Hampshire, MD;  Location: [facility] CATH LAB;  Service: Cardiovascular;  Laterality: N/A;    Family History  Problem Relation Age of Onset  . Hypertension Mother     Pacemaker  . Alcohol abuse Father     Died in [place] accident when patient was a child   Social History:  reports that he has been smoking Cigarettes.  He has a 27.50 pack-year smoking history. He has never used smokeless tobacco. He reports that he drinks alcohol. He reports that he uses drugs, including Hydrocodone, Marijuana, and Cocaine.  Allergies: No Known Allergies  Medications Prior to Admission  Medication Sig Dispense Refill  . amLODipine (NORVASC) 5 MG tablet Take 1 tablet (5 mg total) by mouth daily. 30 tablet 6  . carvedilol (COREG) 12.5 MG tablet TAKE ONE TABLET BY MOUTH TWICE DAILY (Patient taking differently: Take 12.5 mg by mouth two times a day) 180 tablet 3  . celecoxib (CELEBREX) 200 MG capsule Take 200 mg by mouth daily.    Marland Kitchen DEXILANT 60 MG capsule Take 1 capsule (60 mg total) by mouth every morning. 30 capsule   . escitalopram (LEXAPRO) 20 MG tablet Take 20 mg by mouth daily.     . fluticasone (FLONASE) 50 MCG/ACT nasal spray Place 2 sprays into both nostrils at bedtime.     Marland Kitchen  insulin detemir (LEVEMIR) 100 UNIT/ML injection Inject 0.4 mLs (40 Units total) into the skin 1 day or 1 dose. Before breakfast (Patient taking differently: Inject 25 Units into the skin every morning. ) 10 mL 11  . losartan (COZAAR) 50 MG tablet Take 1 tablet (50 mg total) by mouth daily. 90 tablet 3  . meloxicam (MOBIC) 7.5 MG tablet Take 7.5 mg by mouth 2 (two) times daily.    . metoCLOPramide (REGLAN) 5 MG tablet Take 5 mg by mouth 3 (three) times daily before meals.    . naproxen (NAPROSYN) 500 MG tablet Take 500 mg by mouth 2 (two) times daily with a meal.    . ondansetron (ZOFRAN) 4 MG tablet Take 4 mg by mouth every 8 (eight) hours as needed for nausea.    . pravastatin (PRAVACHOL) 20 MG tablet Take 20 mg by mouth every evening.    .  SitaGLIPtin-MetFORMIN HCl (JANUMET XR) 50-1000 MG TB24 Take 1 tablet by mouth 2 (two) times daily.    . sucralfate (CARAFATE) 1 GM/10ML suspension Take 1 g by mouth 4 (four) times daily. ON AN EMPTY STOMACH    . aspirin EC 81 MG tablet Take 1 tablet (81 mg total) by mouth daily.    Marland Kitchen gabapentin (NEURONTIN) 400 MG capsule Take 400-800 mg by mouth See admin instructions. 400 mg in the morning then 400 mg in the evening then 800 mg at bedtime    . hydroxypropyl methylcellulose (ISOPTO TEARS) 2.5 % ophthalmic solution Place 1 drop into both eyes 4 (four) times daily as needed for dry eyes.     Marland Kitchen LORazepam (ATIVAN) 1 MG tablet Take 1 mg by mouth every 6 (six) hours as needed for anxiety.     . Multiple Vitamin (MULTIVITAMIN WITH MINERALS) TABS tablet Take 1 tablet by mouth daily.    . nitroGLYCERIN (NITROSTAT) 0.4 MG SL tablet Place 1 tablet (0.4 mg total) under the tongue every 5 (five) minutes as needed for chest pain. (up to 3 doses) 25 tablet 6  . Omega-3 Fatty Acids (FISH OIL) 1200 MG CAPS Take 1 capsule (1,200 mg total) by mouth daily.      Results for orders placed or performed during the hospital encounter of 09/03/16 (from the past 48 hour(s))  CBG monitoring, ED     Status: Abnormal   Collection Time: 09/03/16  8:43 AM  Result Value Ref Range   Glucose-Capillary >600 (HH) 65 - 99 mg/dL  Blood gas, arterial ([facility] & [facility] ONLY)     Status: Abnormal   Collection Time: 09/03/16  8:53 AM  Result Value Ref Range   O2 Content 3.0 L/min   Delivery systems NASAL CANNULA    pH, Arterial 7.066 (LL) 7.350 - 7.450    Comment: CRITICAL RESULT CALLED TO, READ BACK BY AND VERIFIED WITH: SANDRA BETHEL,RN AT 0938 BY WENDY VIA RRT,RCP ON 09/03/2016    pCO2 arterial  32.0 - 48.0 mmHg    CRITICAL RESULT CALLED TO, READ BACK BY AND VERIFIED WITH:    Comment: BELOW REPORTABLE RANGE TO SANDRA BETHEL,RN AT 0938 BY WENDY VIA RRT,RCP ON 09/03/2016   pO2, Arterial 165 (H) 83.0 - 108.0 mmHg   Bicarbonate  20.0 -  28.0 mmol/L    CRITICAL RESULT CALLED TO, READ BACK BY AND VERIFIED WITH:    Comment: BELOW REPORTABLE RANGE TO SANDRA BETHEL,RN AT 0938 BY WENDY VIA RRT,RCP ON 09/03/2016   O2 Saturation 97.2 %   Patient temperature 37.0    Collection  site REVIEWED BY    Drawn by [id number]    Sample type ARTERIAL DRAW    Allens test (pass/fail) PASS PASS  Urinalysis, Routine w reflex microscopic (not at [facility])     Status: Abnormal   Collection Time: 09/03/16  8:53 AM  Result Value Ref Range   Color, Urine YELLOW YELLOW   APPearance HAZY (A) CLEAR   Specific Gravity, Urine 1.015 1.005 - 1.030   pH 5.0 5.0 - 8.0   Glucose, UA >1000 (A) NEGATIVE mg/dL   Hgb urine dipstick SMALL (A) NEGATIVE   Bilirubin Urine SMALL (A) NEGATIVE   Ketones, ur TRACE (A) NEGATIVE mg/dL   Protein, ur NEGATIVE NEGATIVE mg/dL   Nitrite NEGATIVE NEGATIVE   Leukocytes, UA NEGATIVE NEGATIVE  Rapid urine drug screen (hospital performed)     Status: None   Collection Time: 09/03/16  8:53 AM  Result Value Ref Range   Opiates NONE DETECTED NONE DETECTED   Cocaine NONE DETECTED NONE DETECTED   Benzodiazepines NONE DETECTED NONE DETECTED   Amphetamines NONE DETECTED NONE DETECTED   Tetrahydrocannabinol NONE DETECTED NONE DETECTED   Barbiturates NONE DETECTED NONE DETECTED    Comment:        DRUG SCREEN FOR MEDICAL PURPOSES ONLY.  IF CONFIRMATION IS NEEDED FOR ANY PURPOSE, NOTIFY LAB WITHIN 5 DAYS.        LOWEST DETECTABLE LIMITS FOR URINE DRUG SCREEN Drug Class       Cutoff (ng/mL) Amphetamine      1000 Barbiturate      200 Benzodiazepine   458 Tricyclics       592 Opiates          300 Cocaine          300 THC              50   Urine microscopic-add on     Status: Abnormal   Collection Time: 09/03/16  8:53 AM  Result Value Ref Range   Squamous Epithelial / LPF NONE SEEN NONE SEEN   WBC, UA NONE SEEN 0 - 5 WBC/hpf   RBC / HPF 0-5 0 - 5 RBC/hpf   Bacteria, UA MANY (A) NONE SEEN  Magnesium     Status: Abnormal    Collection Time: 09/03/16  9:26 AM  Result Value Ref Range   Magnesium 2.8 (H) 1.7 - 2.4 mg/dL  Troponin I     Status: Abnormal   Collection Time: 09/03/16  9:26 AM  Result Value Ref Range   Troponin I 0.07 (HH) <0.03 ng/mL    Comment: CRITICAL RESULT CALLED TO, READ BACK BY AND VERIFIED WITH: WHITE,M AT 10:10AM ON 09/03/16 BY FESTERMAN,C   CBC with Differential     Status: Abnormal   Collection Time: 09/03/16  9:26 AM  Result Value Ref Range   WBC 9.7 4.0 - 10.5 K/uL   RBC 4.50 4.22 - 5.81 MIL/uL   Hemoglobin 12.8 (L) 13.0 - 17.0 g/dL   HCT 37.5 (L) 39.0 - 52.0 %   MCV 93.3 78.0 - 100.0 fL   MCH 28.4 26.0 - 34.0 pg   MCHC 30.7 30.0 - 36.0 g/dL   RDW 15.2 11.5 - 15.5 %   Platelets 245 150 - 400 K/uL    Comment: SPECIMEN CHECKED FOR CLOTS PLATELET COUNT CONFIRMED BY SMEAR    Neutrophils Relative % 88 %   Lymphocytes Relative 5 %   Monocytes Relative 7 %   Eosinophils Relative 0 %   Basophils Relative 0 %  Neutro Abs 8.5 (H) 1.7 - 7.7 K/uL   Lymphs Abs 0.5 (L) 0.7 - 4.0 K/uL   Monocytes Absolute 0.7 0.1 - 1.0 K/uL   Eosinophils Absolute 0.0 0.0 - 0.7 K/uL   Basophils Absolute 0.0 0.0 - 0.1 K/uL   WBC Morphology WHITE COUNT CONFIRMED ON SMEAR     Comment: INCREASED BANDS (>20% BANDS)  Comprehensive metabolic panel     Status: Abnormal   Collection Time: 09/03/16  9:26 AM  Result Value Ref Range   Sodium 117 (LL) 135 - 145 mmol/L    Comment: CRITICAL RESULT CALLED TO, READ BACK BY AND VERIFIED WITH: WHITE,M AT 10:10AM ON 09/03/16 BY FESTERMAN,C    Potassium 4.5 3.5 - 5.1 mmol/L   Chloride 72 (L) 101 - 111 mmol/L   CO2 <7 (L) 22 - 32 mmol/L   Glucose, Bld 1,352 (HH) 65 - 99 mg/dL    Comment: RESULTS CONFIRMED BY MANUAL DILUTION CRITICAL RESULT CALLED TO, READ BACK BY AND VERIFIED WITH: WHITE,M AT 10:10AM ON 09/03/16 BY FESTERMAN,C    BUN 103 (H) 6 - 20 mg/dL   Creatinine, Ser 4.82 (H) 0.61 - 1.24 mg/dL   Calcium 8.1 (L) 8.9 - 10.3 mg/dL   Total Protein 5.1 (L) 6.5 -  8.1 g/dL   Albumin 2.3 (L) 3.5 - 5.0 g/dL   AST 18 15 - 41 U/L   ALT 24 17 - 63 U/L   Alkaline Phosphatase 121 38 - 126 U/L   Total Bilirubin 1.9 (H) 0.3 - 1.2 mg/dL   GFR calc non Af Amer 11 (L) >60 mL/min   GFR calc Af Amer 13 (L) >60 mL/min    Comment: (NOTE) The eGFR has been calculated using the CKD EPI equation. This calculation has not been validated in all clinical situations. eGFR's persistently <60 mL/min signify possible Chronic Kidney Disease.   Ethanol     Status: None   Collection Time: 09/03/16  9:27 AM  Result Value Ref Range   Alcohol, Ethyl (B) <5 <5 mg/dL    Comment:        LOWEST DETECTABLE LIMIT FOR SERUM ALCOHOL IS 5 mg/dL FOR MEDICAL PURPOSES ONLY   Lactic acid, plasma     Status: Abnormal   Collection Time: 09/03/16  9:27 AM  Result Value Ref Range   Lactic Acid, Venous 3.7 (HH) 0.5 - 1.9 mmol/L    Comment: CRITICAL RESULT CALLED TO, READ BACK BY AND VERIFIED WITH: CRUISE,J AT 10:00AM ON 09/03/16 BY FESTERMAN,C   CBG monitoring, ED     Status: Abnormal   Collection Time: 09/03/16 10:40 AM  Result Value Ref Range   Glucose-Capillary >600 (HH) 65 - 99 mg/dL  CBG monitoring, ED     Status: Abnormal   Collection Time: 09/03/16 11:48 AM  Result Value Ref Range   Glucose-Capillary >600 (HH) 65 - 99 mg/dL  Glucose, capillary     Status: Abnormal   Collection Time: 09/03/16  1:23 PM  Result Value Ref Range   Glucose-Capillary >600 (HH) 65 - 99 mg/dL   Comment 1 Document in Chart   I-STAT 3, arterial blood gas (G3+)     Status: Abnormal   Collection Time: 09/03/16  1:24 PM  Result Value Ref Range   pH, Arterial 7.203 (L) 7.350 - 7.450   pCO2 arterial 23.4 (L) 32.0 - 48.0 mmHg   pO2, Arterial 127.0 (H) 83.0 - 108.0 mmHg   Bicarbonate 9.2 (L) 20.0 - 28.0 mmol/L   TCO2 10 0 -  100 mmol/L   O2 Saturation 98.0 %   Acid-base deficit 17.0 (H) 0.0 - 2.0 mmol/L   Patient temperature 98.6 F    Collection site RADIAL, ALLEN'S TEST ACCEPTABLE    Drawn by  Operator    Sample type ARTERIAL   MRSA PCR Screening     Status: None   Collection Time: 09/03/16  1:31 PM  Result Value Ref Range   MRSA by PCR NEGATIVE NEGATIVE    Comment:        The GeneXpert MRSA Assay (FDA approved for NASAL specimens only), is one component of a comprehensive MRSA colonization surveillance program. It is not intended to diagnose MRSA infection nor to guide or monitor treatment for MRSA infections.   Glucose, capillary     Status: Abnormal   Collection Time: 09/03/16  2:42 PM  Result Value Ref Range   Glucose-Capillary >600 (HH) 65 - 99 mg/dL   Comment 1 Document in Chart   Basic metabolic panel     Status: Abnormal   Collection Time: 09/03/16  2:45 PM  Result Value Ref Range   Sodium 125 (L) 135 - 145 mmol/L   Potassium 3.9 3.5 - 5.1 mmol/L   Chloride 88 (L) 101 - 111 mmol/L   CO2 15 (L) 22 - 32 mmol/L   Glucose, Bld 960 (HH) 65 - 99 mg/dL    Comment: CRITICAL RESULT CALLED TO, READ BACK BY AND VERIFIED WITH: MINOR,A RN '@[id number]'  BY GRINSTEAD,C 10.5.17    BUN 92 (H) 6 - 20 mg/dL   Creatinine, Ser 4.72 (H) 0.61 - 1.24 mg/dL   Calcium 7.2 (L) 8.9 - 10.3 mg/dL   GFR calc non Af Amer 11 (L) >60 mL/min   GFR calc Af Amer 13 (L) >60 mL/min    Comment: (NOTE) The eGFR has been calculated using the CKD EPI equation. This calculation has not been validated in all clinical situations. eGFR's persistently <60 mL/min signify possible Chronic Kidney Disease.    Anion gap 22 (H) 5 - 15  Beta-hydroxybutyric acid     Status: Abnormal   Collection Time: 09/03/16  2:45 PM  Result Value Ref Range   Beta-Hydroxybutyric Acid 7.56 (H) 0.05 - 0.27 mmol/L    Comment: RESULTS CONFIRMED BY MANUAL DILUTION  Troponin I     Status: Abnormal   Collection Time: 09/03/16  2:45 PM  Result Value Ref Range   Troponin I 0.52 (HH) <0.03 ng/mL    Comment: CRITICAL RESULT CALLED TO, READ BACK BY AND VERIFIED WITH: MINOR,A RN '@[id number]'  BY GRINSTEAD,C 10.5.17   Lactic acid, plasma      Status: Abnormal   Collection Time: 09/03/16  2:45 PM  Result Value Ref Range   Lactic Acid, Venous 2.9 (HH) 0.5 - 1.9 mmol/L    Comment: CRITICAL RESULT CALLED TO, READ BACK BY AND VERIFIED WITH: FLOOD,L RN @ 1103 09/03/16 LEONARD,A   Urine rapid drug screen (hosp performed)     Status: None   Collection Time: 09/03/16  3:58 PM  Result Value Ref Range   Opiates NONE DETECTED NONE DETECTED   Cocaine NONE DETECTED NONE DETECTED   Benzodiazepines NONE DETECTED NONE DETECTED   Amphetamines NONE DETECTED NONE DETECTED   Tetrahydrocannabinol NONE DETECTED NONE DETECTED   Barbiturates NONE DETECTED NONE DETECTED    Comment:        DRUG SCREEN FOR MEDICAL PURPOSES ONLY.  IF CONFIRMATION IS NEEDED FOR ANY PURPOSE, NOTIFY LAB WITHIN 5 DAYS.  LOWEST DETECTABLE LIMITS FOR URINE DRUG SCREEN Drug Class       Cutoff (ng/mL) Amphetamine      1000 Barbiturate      200 Benzodiazepine   101 Tricyclics       751 Opiates          300 Cocaine          300 THC              50   Glucose, capillary     Status: Abnormal   Collection Time: 09/03/16  3:59 PM  Result Value Ref Range   Glucose-Capillary >600 (HH) 65 - 99 mg/dL   Comment 1 Notify RN    Comment 2 Document in Chart   Glucose, capillary     Status: Abnormal   Collection Time: 09/03/16  5:05 PM  Result Value Ref Range   Glucose-Capillary >600 (HH) 65 - 99 mg/dL   Comment 1 Notify RN    Comment 2 Document in Chart   Basic metabolic panel     Status: Abnormal   Collection Time: 09/03/16  5:48 PM  Result Value Ref Range   Sodium 127 (L) 135 - 145 mmol/L   Potassium 3.5 3.5 - 5.1 mmol/L   Chloride 89 (L) 101 - 111 mmol/L   CO2 21 (L) 22 - 32 mmol/L   Glucose, Bld 781 (HH) 65 - 99 mg/dL    Comment: CRITICAL RESULT CALLED TO, READ BACK BY AND VERIFIED WITH: TUTTLE,S RN 09/03/2016 2007 JORDANS    BUN 90 (H) 6 - 20 mg/dL   Creatinine, Ser 4.36 (H) 0.61 - 1.24 mg/dL   Calcium 7.4 (L) 8.9 - 10.3 mg/dL   GFR calc non Af Amer 13  (L) >60 mL/min   GFR calc Af Amer 15 (L) >60 mL/min    Comment: (NOTE) The eGFR has been calculated using the CKD EPI equation. This calculation has not been validated in all clinical situations. eGFR's persistently <60 mL/min signify possible Chronic Kidney Disease.    Anion gap 17 (H) 5 - 15  Glucose, capillary     Status: Abnormal   Collection Time: 09/03/16  6:05 PM  Result Value Ref Range   Glucose-Capillary >600 (HH) 65 - 99 mg/dL   Comment 1 Notify RN    Comment 2 Document in Chart   Glucose, capillary     Status: Abnormal   Collection Time: 09/03/16  7:21 PM  Result Value Ref Range   Glucose-Capillary >600 (HH) 65 - 99 mg/dL   Comment 1 Notify RN    Comment 2 Document in Chart   Glucose, capillary     Status: Abnormal   Collection Time: 09/03/16  8:20 PM  Result Value Ref Range   Glucose-Capillary >600 (HH) 65 - 99 mg/dL   Comment 1 Notify RN    Comment 2 Document in Chart    Dg Chest Portable 1 View  Result Date: 09/03/2016 CLINICAL DATA:  AMS/cad/chf/htn/smoker/hx heart cath/by pass surgery EXAM: PORTABLE CHEST 1 VIEW COMPARISON:  12/02/2015 FINDINGS: Shallow lung inflation. Heart size is probably normal. No focal consolidations or pleural effusions. Remote rib fractures. IMPRESSION: No evidence for acute  abnormality. Electronically Signed   By: Nolon Nations M.D.   On: 09/03/2016 11:26    Review Of Systems Unable to obtain.  Blood pressure 97/61, pulse 91, temperature 98.8 F (37.1 C), temperature source Core (Comment), resp. rate 13, height '5\' 10"'  (1.778 m), weight 78 kg (171 lb 15.3 oz), SpO2 100 %.  Body mass index is 24.67 kg/m. Physical exam: General: Normocephalic, atraumatic. EENT: Sclerae anicteric, MM very dry Resp: Clear, bil. Cardiovascular: RRR, Normal S1 and S2. Gr II/VI systolic murmur. Abdomen: Soft. BS present. Skin: Warm and dry. Ext: No edema.  Assessment/Plan DKA with altered mentation Demand ischemia with minimal elevation of  Troponin-I. HTN Hyperlipidemia Pseudohyponatremia Acute on chronic kidney failure Dehydration H/O alcohol abuse.  Agree with r/o MI. Notify primary cardiology in AM.   Birdie Riddle, MD  09/03/2016, 9:31 PM

## 2016-09-03 NOTE — ED Triage Notes (Signed)
Ems reports pt lives with his sister and reports his sister says pt has had decreased loc for the past 3 days.  EMS arrived to find pt with altered loc and cbg reading  High.  EMS says pt also has history of alcoholism and hasnt drank in the last 3 days.  Reports hasn't taken his medications in the past month.  Initial bp with ems was 54/30.  EMS unable to obtain iv access so they inserted an IO into left lower leg.  Also reports no radial pulses initially.  EMS administered 20mg  lidocaine via I/O and bolus NSS infusing.  BP increased to 93/58, afib on monitor and hr was between 90 and 140.

## 2016-09-03 NOTE — ED Notes (Signed)
Dr. Juleen China attempted 2 IJ sticks without success.  Pt moving around some.  Incoherent.

## 2016-09-03 NOTE — Progress Notes (Signed)
eLink Physician-Brief Progress Note Patient Name: Aaron Santiago DOB: June 11, 1946 MRN: [id number]   Date of Service  09/03/2016  HPI/Events of Note  Troponin #1 = 0.07 >> 0.52. BP = 114/58 (MAP = 74). EKG this AM : AFIB - rate = 123. 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 Now Sinus Tachycardia - rate = 111.  eICU Interventions  Will order: 1. ASA suppository 150 mg PR now and Q day. 2. Metoprolol 2.5 mg IV Q 6 hours. Hold for SBP < 100 or HR < 60. 3. Heparin IV infusion per pharmacy consult. 4. Will consult cardiology.      Intervention Category Intermediate Interventions: Diagnostic test evaluation  Lenell Antu 09/03/2016, 4:37 PM

## 2016-09-03 NOTE — ED Notes (Signed)
edp notified of ABG RESULTS

## 2016-09-03 NOTE — ED Notes (Signed)
Pt moaning and incoherent.  Not following commands at this time.

## 2016-09-03 NOTE — ED Notes (Signed)
CRITICAL VALUE ALERT  Critical value received:  Sodium 117, Glucose 1352, Troponin 0.07  Date of notification:  09/03/16  Time of notification:  1010  Critical value read back:Yes.    Nurse who received alert:  Fransico Him, RN  MD notified (1st page):  Dr. Juleen China  Time of first page:  1011  MD notified (2nd page):  Time of second page:  Responding MD:  Dr. Juleen China  Time MD responded:  1011

## 2016-09-03 NOTE — Progress Notes (Signed)
CRITICAL VALUE ALERT  Critical value received:  Glucose 960, Troponin 0.52, Lactic Acid 2.9  Date of notification: 09/03/16  Time of notification:  1552/1606  Critical value read back: Yes  Nurse who received alert:  Anneita Minor and Linsdey Floor RN   MD notified (1st page):  MD Arsenio Loader   Time of first page:  1630  MD notified (2nd page):  Time of second page:  Responding MD:  MD Arsenio Loader  Time MD responded: 1630

## 2016-09-04 DIAGNOSIS — R748 Abnormal levels of other serum enzymes: Secondary | ICD-10-CM

## 2016-09-04 LAB — BASIC METABOLIC PANEL
ANION GAP: 8 (ref 5–15)
Anion gap: 11 (ref 5–15)
Anion gap: 13 (ref 5–15)
BUN: 85 mg/dL — AB (ref 6–20)
BUN: 84 mg/dL — ABNORMAL HIGH (ref 6–20)
BUN: 70 mg/dL — ABNORMAL HIGH (ref 6–20)
CALCIUM: 8 mg/dL — AB (ref 8.9–10.3)
CALCIUM: 8.1 mg/dL — AB (ref 8.9–10.3)
CALCIUM: 7.6 mg/dL — AB (ref 8.9–10.3)
CHLORIDE: 100 mmol/L — AB (ref 101–111)
CO2: 26 mmol/L (ref 22–32)
CO2: 27 mmol/L (ref 22–32)
CO2: 25 mmol/L (ref 22–32)
CREATININE: 3.91 mg/dL — AB (ref 0.61–1.24)
CREATININE: 3.78 mg/dL — AB (ref 0.61–1.24)
Chloride: 98 mmol/L — ABNORMAL LOW (ref 101–111)
Chloride: 99 mmol/L — ABNORMAL LOW (ref 101–111)
Creatinine, Ser: 2.97 mg/dL — ABNORMAL HIGH (ref 0.61–1.24)
GFR calc non Af Amer: 15 mL/min — ABNORMAL LOW (ref >60)
GFR calc non Af Amer: 20 mL/min — ABNORMAL LOW (ref >60)
GFR, EST AFRICAN AMERICAN: 17 mL/min — AB (ref >60)
GFR, EST AFRICAN AMERICAN: 17 mL/min — AB (ref >60)
GFR, EST AFRICAN AMERICAN: 23 mL/min — AB (ref >60)
GFR, EST NON AFRICAN AMERICAN: 14 mL/min — AB (ref >60)
GLUCOSE: 46 mg/dL — AB (ref 65–99)
Glucose, Bld: 109 mg/dL — ABNORMAL HIGH (ref 65–99)
Glucose, Bld: 304 mg/dL — ABNORMAL HIGH (ref 65–99)
Potassium: 3.2 mmol/L — ABNORMAL LOW (ref 3.5–5.1)
Potassium: 3.3 mmol/L — ABNORMAL LOW (ref 3.5–5.1)
Potassium: 3.2 mmol/L — ABNORMAL LOW (ref 3.5–5.1)
SODIUM: 137 mmol/L (ref 135–145)
Sodium: 138 mmol/L (ref 135–145)
Sodium: 132 mmol/L — ABNORMAL LOW (ref 135–145)

## 2016-09-04 LAB — CBC
HEMATOCRIT: 32.8 % — AB (ref 39.0–52.0)
Hemoglobin: 11.8 g/dL — ABNORMAL LOW (ref 13.0–17.0)
MCH: 27.8 pg (ref 26.0–34.0)
MCHC: 36 g/dL (ref 30.0–36.0)
MCV: 77.4 fL — AB (ref 78.0–100.0)
PLATELETS: 236 10*3/uL (ref 150–400)
RBC: 4.24 MIL/uL (ref 4.22–5.81)
RDW: 14 % (ref 11.5–15.5)
WBC: 12.4 10*3/uL — ABNORMAL HIGH (ref 4.0–10.5)

## 2016-09-04 LAB — BLOOD GAS, ARTERIAL
ACID-BASE EXCESS: 2.1 mmol/L — AB (ref 0.0–2.0)
BICARBONATE: 26.8 mmol/L (ref 20.0–28.0)
Drawn by: 24485
O2 Content: 3 L/min
O2 SAT: 96.4 %
PATIENT TEMPERATURE: 98.6
pCO2 arterial: 47.6 mmHg (ref 32.0–48.0)
pH, Arterial: 7.37 (ref 7.350–7.450)
pO2, Arterial: 86 mmHg (ref 83.0–108.0)

## 2016-09-04 LAB — GLUCOSE, CAPILLARY
GLUCOSE-CAPILLARY: 102 mg/dL — AB (ref 65–99)
GLUCOSE-CAPILLARY: 105 mg/dL — AB (ref 65–99)
GLUCOSE-CAPILLARY: 318 mg/dL — AB (ref 65–99)
Glucose-Capillary: 113 mg/dL — ABNORMAL HIGH (ref 65–99)
Glucose-Capillary: 116 mg/dL — ABNORMAL HIGH (ref 65–99)
Glucose-Capillary: 149 mg/dL — ABNORMAL HIGH (ref 65–99)
Glucose-Capillary: 172 mg/dL — ABNORMAL HIGH (ref 65–99)
Glucose-Capillary: 194 mg/dL — ABNORMAL HIGH (ref 65–99)
Glucose-Capillary: 255 mg/dL — ABNORMAL HIGH (ref 65–99)
Glucose-Capillary: 283 mg/dL — ABNORMAL HIGH (ref 65–99)
Glucose-Capillary: 416 mg/dL — ABNORMAL HIGH (ref 65–99)
Glucose-Capillary: 439 mg/dL — ABNORMAL HIGH (ref 65–99)
Glucose-Capillary: 45 mg/dL — ABNORMAL LOW (ref 65–99)
Glucose-Capillary: 53 mg/dL — ABNORMAL LOW (ref 65–99)

## 2016-09-04 LAB — HEPARIN LEVEL (UNFRACTIONATED)
HEPARIN UNFRACTIONATED: 0.81 [IU]/mL — AB (ref 0.30–0.70)
Heparin Unfractionated: 0.58 IU/mL (ref 0.30–0.70)

## 2016-09-04 LAB — TROPONIN I
TROPONIN I: 0.99 ng/mL — AB (ref <0.03)
Troponin I: 1.2 ng/mL (ref <0.03)

## 2016-09-04 LAB — URINE CULTURE: Culture: NO GROWTH

## 2016-09-04 LAB — LACTIC ACID, PLASMA: LACTIC ACID, VENOUS: 1 mmol/L (ref 0.5–1.9)

## 2016-09-04 LAB — PHOSPHORUS: Phosphorus: <1 mg/dL — CL (ref 2.5–4.6)

## 2016-09-04 LAB — CALCIUM, IONIZED: Calcium, Ionized, Serum: 4.2 mg/dL — ABNORMAL LOW (ref 4.5–5.6)

## 2016-09-04 LAB — HEMOGLOBIN A1C
Hgb A1c MFr Bld: 13.1 % — ABNORMAL HIGH (ref 4.8–5.6)
Mean Plasma Glucose: 329 mg/dL

## 2016-09-04 LAB — MAGNESIUM: MAGNESIUM: 2.1 mg/dL (ref 1.7–2.4)

## 2016-09-04 MED ORDER — POTASSIUM CHLORIDE 10 MEQ/100ML IV SOLN
10.0000 meq | INTRAVENOUS | Status: AC
  Administered 2016-09-04 (×2): 10 meq via INTRAVENOUS
  Filled 2016-09-04 (×2): qty 100

## 2016-09-04 MED ORDER — POTASSIUM CHLORIDE 10 MEQ/50ML IV SOLN
10.0000 meq | INTRAVENOUS | Status: AC
  Administered 2016-09-04 – 2016-09-05 (×6): 10 meq via INTRAVENOUS
  Filled 2016-09-04: qty 50

## 2016-09-04 MED ORDER — ADULT MULTIVITAMIN W/MINERALS CH
1.0000 | ORAL_TABLET | Freq: Every day | ORAL | Status: DC
  Administered 2016-09-06 – 2016-09-14 (×8): 1 via ORAL
  Filled 2016-09-04 (×11): qty 1

## 2016-09-04 MED ORDER — INSULIN DETEMIR 100 UNIT/ML ~~LOC~~ SOLN
17.0000 [IU] | SUBCUTANEOUS | Status: DC
  Administered 2016-09-04 – 2016-09-05 (×2): 17 [IU] via SUBCUTANEOUS
  Filled 2016-09-04 (×2): qty 0.17

## 2016-09-04 MED ORDER — FOLIC ACID 1 MG PO TABS
1.0000 mg | ORAL_TABLET | Freq: Every day | ORAL | Status: DC
  Administered 2016-09-06 – 2016-09-14 (×8): 1 mg via ORAL
  Filled 2016-09-04 (×10): qty 1

## 2016-09-04 MED ORDER — SODIUM CHLORIDE 0.9 % IV SOLN
30.0000 mmol | Freq: Once | INTRAVENOUS | Status: AC
  Administered 2016-09-04: 30 mmol via INTRAVENOUS
  Filled 2016-09-04: qty 10

## 2016-09-04 MED ORDER — LORAZEPAM 2 MG/ML IJ SOLN
0.5000 mg | Freq: Once | INTRAMUSCULAR | Status: AC
  Administered 2016-09-04: 0.5 mg via INTRAVENOUS
  Filled 2016-09-04: qty 1

## 2016-09-04 MED ORDER — THIAMINE HCL 100 MG/ML IJ SOLN: 100.0000 mg | Freq: Every day | INTRAMUSCULAR | Status: DC

## 2016-09-04 MED ORDER — INSULIN ASPART 100 UNIT/ML ~~LOC~~ SOLN
0.0000 [IU] | SUBCUTANEOUS | Status: DC
  Administered 2016-09-04: 11 [IU] via SUBCUTANEOUS
  Administered 2016-09-05: 3 [IU] via SUBCUTANEOUS
  Administered 2016-09-05: 8 [IU] via SUBCUTANEOUS
  Administered 2016-09-05 – 2016-09-06 (×3): 2 [IU] via SUBCUTANEOUS
  Administered 2016-09-06 – 2016-09-07 (×3): 3 [IU] via SUBCUTANEOUS

## 2016-09-04 MED ORDER — VITAMIN B-1 100 MG PO TABS
100.0000 mg | ORAL_TABLET | Freq: Every day | ORAL | Status: DC
  Administered 2016-09-06 – 2016-09-14 (×8): 100 mg via ORAL
  Filled 2016-09-04 (×10): qty 1

## 2016-09-04 MED ORDER — LORAZEPAM 2 MG/ML IJ SOLN
1.0000 mg | Freq: Four times a day (QID) | INTRAMUSCULAR | Status: AC | PRN
  Administered 2016-09-05 – 2016-09-07 (×3): 1 mg via INTRAVENOUS
  Filled 2016-09-04 (×3): qty 1

## 2016-09-04 MED ORDER — THIAMINE HCL 100 MG/ML IJ SOLN
100.0000 mg | Freq: Every day | INTRAMUSCULAR | Status: DC
  Administered 2016-09-05: 100 mg via INTRAVENOUS
  Filled 2016-09-04 (×6): qty 1

## 2016-09-04 MED ORDER — INSULIN ASPART 100 UNIT/ML ~~LOC~~ SOLN
0.0000 [IU] | Freq: Three times a day (TID) | SUBCUTANEOUS | Status: DC
  Administered 2016-09-04 (×2): 3 [IU] via SUBCUTANEOUS

## 2016-09-04 MED ORDER — DEXTROSE 50 % IV SOLN
INTRAVENOUS | Status: AC
  Filled 2016-09-04: qty 50

## 2016-09-04 MED ORDER — POTASSIUM CHLORIDE 10 MEQ/100ML IV SOLN
10.0000 meq | INTRAVENOUS | Status: AC
  Administered 2016-09-04 (×2): 10 meq via INTRAVENOUS
  Filled 2016-09-04: qty 100

## 2016-09-04 MED ORDER — DEXTROSE 50 % IV SOLN
19.0000 mL | Freq: Once | INTRAVENOUS | Status: AC
  Administered 2016-09-04: 19 mL via INTRAVENOUS

## 2016-09-04 MED ORDER — VITAMIN B-1 100 MG PO TABS: 100.0000 mg | ORAL_TABLET | Freq: Every day | ORAL | Status: DC

## 2016-09-04 MED ORDER — LORAZEPAM 1 MG PO TABS: 1.0000 mg | ORAL_TABLET | Freq: Four times a day (QID) | ORAL | Status: AC | PRN

## 2016-09-04 NOTE — Progress Notes (Signed)
PULMONARY / CRITICAL CARE MEDICINE   Name: Aaron Santiago MRN: [id number] DOB: [birth year]    ADMISSION DATE:  09/03/2016 CONSULTATION DATE:  09/03/2016  REFERRING MD:  [facility]  CHIEF COMPLAINT:  AMS  HISTORY OF PRESENT ILLNESS:  Pt is encephelopathic; therefore, this HPI is obtained from chart review. Aaron Santiago is a 70 y.o. male with PMH as outlined below including but not limited to DM for which he has been non-compliant with meds (reportedly stopped taking 1 month ago).  He presented to [facility] ED on 10/5 via EMS due to AMS.  He apparently lives with his sister who was the one who noticed his decreased / worsening level of consciousness.  He apparently has heavy ETOH hx but stopped drinking 3 days prior to presentation.  In ED, labs were suggestive of DKA vs HONK.  He was felt to be too unstable; therefore, was transferred to [facility] ICU for further evaluation and management.  PAST MEDICAL HISTORY :  He  has a past medical history of Arthritis; CAD (coronary artery disease); Carotid artery stenosis; CHF (congestive heart failure) (HCC); Essential hypertension; GERD (gastroesophageal reflux disease); History of substance abuse; Hyperlipidemia; Peptic ulcer disease; Seasonal allergies; Stomach ulcer (1972); and Type 2 diabetes mellitus (HCC).  PAST SURGICAL HISTORY: He  has a past surgical history that includes Antrectomy (~1972); Cholecystectomy (06/2010); Fracture surgery; Femur fracture surgery (~ 1964); Coronary artery bypass graft (2002); Coronary angioplasty with stent (11/06/11); Knee arthroscopy with medial menisectomy (Right, 06/06/2013); left heart catheterization with coronary angiogram (N/A, 11/06/2011); and percutaneous coronary stent intervention (pci-s) (N/A, 11/06/2011).  No Known Allergies  No current facility-administered medications on file prior to encounter.    Current Outpatient Prescriptions on File Prior to Encounter  Medication Sig  . amLODipine (NORVASC) 5 MG tablet Take  1 tablet (5 mg total) by mouth daily.  . carvedilol (COREG) 12.5 MG tablet TAKE ONE TABLET BY MOUTH TWICE DAILY (Patient taking differently: Take 12.5 mg by mouth two times a day)  . celecoxib (CELEBREX) 200 MG capsule Take 200 mg by mouth daily.  Marland Kitchen DEXILANT 60 MG capsule Take 1 capsule (60 mg total) by mouth every morning.  . escitalopram (LEXAPRO) 20 MG tablet Take 20 mg by mouth daily.   . fluticasone (FLONASE) 50 MCG/ACT nasal spray Place 2 sprays into both nostrils at bedtime.   . insulin detemir (LEVEMIR) 100 UNIT/ML injection Inject 0.4 mLs (40 Units total) into the skin 1 day or 1 dose. Before breakfast (Patient taking differently: Inject 25 Units into the skin every morning. )  . losartan (COZAAR) 50 MG tablet Take 1 tablet (50 mg total) by mouth daily.  . meloxicam (MOBIC) 7.5 MG tablet Take 7.5 mg by mouth 2 (two) times daily.  . SitaGLIPtin-MetFORMIN HCl (JANUMET XR) 50-1000 MG TB24 Take 1 tablet by mouth 2 (two) times daily.  Marland Kitchen aspirin EC 81 MG tablet Take 1 tablet (81 mg total) by mouth daily.  Marland Kitchen gabapentin (NEURONTIN) 400 MG capsule Take 400-800 mg by mouth See admin instructions. 400 mg in the morning then 400 mg in the evening then 800 mg at bedtime  . hydroxypropyl methylcellulose (ISOPTO TEARS) 2.5 % ophthalmic solution Place 1 drop into both eyes 4 (four) times daily as needed for dry eyes.   Marland Kitchen LORazepam (ATIVAN) 1 MG tablet Take 1 mg by mouth every 6 (six) hours as needed for anxiety.   . Multiple Vitamin (MULTIVITAMIN WITH MINERALS) TABS tablet Take 1 tablet by mouth daily.  Marland Kitchen  nitroGLYCERIN (NITROSTAT) 0.4 MG SL tablet Place 1 tablet (0.4 mg total) under the tongue every 5 (five) minutes as needed for chest pain. (up to 3 doses)  . Omega-3 Fatty Acids (FISH OIL) 1200 MG CAPS Take 1 capsule (1,200 mg total) by mouth daily.    FAMILY HISTORY:  His indicated that his mother is alive. He indicated that his father is deceased. He indicated that his sister is alive. He indicated  that his brother is deceased.    SOCIAL HISTORY: He  reports that he has been smoking Cigarettes.  He has a 27.50 pack-year smoking history. He has never used smokeless tobacco. He reports that he drinks alcohol. He reports that he uses drugs, including Hydrocodone, Marijuana, and Cocaine.  REVIEW OF SYSTEMS:   Pt encephalopathic.   SUBJECTIVE:  Patient still confused but follows commands. Can report his name. Otherwise, content of speech does not make sense. Intermittently agitated.   VITAL SIGNS: BP 113/78 (BP Location: Right Arm)   Pulse 89   Temp 97.9 F (36.6 C) (Oral)   Resp (!) 24   Ht 5\' 10"  (1.778 m)   Wt 78 kg (171 lb 15.3 oz)   SpO2 100%   BMI 24.67 kg/m   HEMODYNAMICS:    VENTILATOR SETTINGS:    INTAKE / OUTPUT: I/O last 3 completed shifts: In: 2826.6 [I.V.:2626.6; IV Piggyback:200] Out: 610 [Urine:610]  PHYSICAL EXAMINATION: General: Adult male, resting in bed, in NAD. Neuro: Hypersomnolent but able to be aroused by noxious stimuli. AOx1 (knows name but not place or season) HEENT: [place]/AT. PERRL, sclerae anicteric. MM tacky. Cardiovascular: RRR, no M/R/G.  Lungs: Respirations even and unlabored.  CTA bilaterally, No W/R/R. Abdomen: BS x 4, soft, lower abdomen mildly TTP, no rebound or guarding  Musculoskeletal: No gross deformities, no edema.  Skin: Intact, warm, no rashes.  LABS:  BMET  Recent Labs Lab 09/03/16 2152 09/04/16 0148 09/04/16 0524  NA 129* 137 138  K 3.4* 3.2* 3.3*  CL 94* 100* 98*  CO2 25 26 27   BUN 86* 85* 84*  CREATININE 4.14* 3.91* 3.78*  GLUCOSE 506* 109* 46*    Electrolytes  Recent Labs Lab 09/03/16 0926  09/03/16 2152 09/04/16 0148 09/04/16 0524  CALCIUM 8.1*  < > 7.6* 8.0* 8.1*  MG 2.8*  --   --   --  2.1  PHOS  --   --   --   --  <1.0*  < > = values in this interval not displayed.  CBC  Recent Labs Lab 09/03/16 0926 09/04/16 0524  WBC 9.7 12.4*  HGB 12.8* 11.8*  HCT 37.5* 32.8*  PLT 245 236     Coag's No results for input(s): APTT, INR in the last 168 hours.  Sepsis Markers  Recent Labs Lab 09/03/16 0927 09/03/16 1445 09/04/16 0930  LATICACIDVEN 3.7* 2.9* 1.0    ABG  Recent Labs Lab 09/03/16 0853 09/03/16 1324 09/04/16 0920  PHART 7.066* 7.203* 7.370  PCO2ART CRITICAL RESULT CALLED TO, READ BACK BY AND VERIFIED WITH: 23.4* 47.6  PO2ART 165* 127.0* 86.0    Liver Enzymes  Recent Labs Lab 09/03/16 0926  AST 18  ALT 24  ALKPHOS 121  BILITOT 1.9*  ALBUMIN 2.3*    Cardiac Enzymes  Recent Labs Lab 09/03/16 2152 09/04/16 0524 09/04/16 0930  TROPONINI 1.14* 1.20* 0.99*    Glucose  Recent Labs Lab 09/04/16 0521 09/04/16 0522 09/04/16 0548 09/04/16 0645 09/04/16 0810 09/04/16 1120  GLUCAP 53* 45* 105* 102* 113* 172*  Imaging No results found.  STUDIES:  CXR 10/5 > No focal consolidations.   CULTURES: Blood 10/5 > Urine 10/5 >  ANTIBIOTICS: None.  SIGNIFICANT EVENTS: 10/5 > admitted with DKA vs HONK.  LINES/TUBES: R femoral CVL 10/5 >  DISCUSSION: 70 y.o. male with hx DM, non-compliant with outpatient meds x 1 month, presented to [facility] ED 10/5 with AMS. Found to have DKA; transferred to [facility] ICU for further evaluation and management. Able to be transitioned off insulin gtt this a.m. Still altered and occasionally agitated despite improving metabolic acidosis.   ASSESSMENT / PLAN:  ENDOCRINE A:   Suspect DKA instead of HONK given degree of metabolic acidosis. M - non-compliant with meds x 1 month per report.   P:   Fluids / insulin per DKA protocol. Repeat ABG. Needs education regarding medication compliance as outpatient. Hold preadmission levemir, janumet. Restart as stabilizes   PULMONARY A: Initial concern for need to intubate if mental status worsened.  Tobacco dependence. P:   Monitor respiratory status closely Pulmonary hygiene. Tobacco cessation counseling once mental status  improves.  CARDIOVASCULAR A:  Troponin leak - suspect due to demand but troponins continuing to rise.  Hx HTN, HLD, CHF (echo from 2012 with EF 60%, G1DD), CAD. P:  Monitor hemodynamics. Trend troponin / lactate. Repeat this a.m. Cardiology to see this a.m.  On heparin drip.  Continue preadmission ASA. Hold preadmission amlodipine, carvedilol, losartan, nitro.  RENAL A:   Pseudohyponatremia - due to hyperglycemia.  Corrects to 137. AGMA - due to DKA + uremia. AKI - presumed pre-renal due to hypovolemia. Pseudohypocalcemia - corrects to 9.4. P:   Fluids / insulin per DKA protocol. Assess ionized calcium. BMP in AM.  GASTROINTESTINAL A:   Nutrition. HX PUD. P:   NPO due to MS  HEMATOLOGIC A:   ACS r/o on heparin drip. P:  SCD's / heparin. CBC in AM.  INFECTIOUS A:   No indication of infection. P:   Defer abx for now. Follow cultures as above. Assess PCT - if high; then will start empiric abx.  NEUROLOGIC A:   Acute encephalopathy - due to DKA with possible contribution of EtOH withdrawal. Hx substance abuse (ETOH and cocaine). P:   Limit sedating medications as able but consider ordering CIWA protocol vs giving prn 0.5 mg ativan.  Thiamine / Folate. UDS negative.  Substance abuse counseling. Hold preadmssion escitalopram, gabapentin, lorazepam. RAS: -1 Consider head CT if MS worsens  Family updated: None available.  Interdisciplinary Family Meeting v Palliative Care Meeting:  Due by: 09/10/16. RASS goal: 0  Windell Moment, MD 09/04/2016, 12:20 PM   Attending Note:  I have examined patient, reviewed labs, studies and notes. I have discussed the case with Dr Sampson Goon, and I agree with the data and plans as amended above. 70 yo man with DM and EtOH abuse, off meds for about 1 month, presented to [facility] with severe encephalopathy. Found to have metabolic disarray most consistent with HONC and resultant acute renal failure, was started on IVF and  insulin.  On my eval he is awake but confused, poorly oriented. Comfortable resp pattern without crackles. Hemodynamically stable. ABG normalized, AG closed and insulin off. Renal fxn improved some with hydration. He is developing EtOH withdrawal. We will continue aggressive glucose control, adjust his ativan and start CIWA monitoring.  Independent critical care time is 33 minutes.   Levy Pupa, MD, PhD 09/04/2016, 12:24 PM [facility] [phone] or if no answer [phone]

## 2016-09-04 NOTE — Progress Notes (Signed)
ANTICOAGULATION CONSULT NOTE - Follow-up Consult  Pharmacy Consult for heparin Indication: chest pain/ACS  No Known Allergies  Patient Measurements: Height: 5\' 10"  (177.8 cm) Weight: 171 lb 15.3 oz (78 kg) IBW/kg (Calculated) : 73 Heparin Dosing Weight: 78 kg  Vital Signs: Temp: 98.4 F (36.9 C) (10/05 2342) Temp Source: Core (Comment) (10/05 2342) BP: 100/71 (10/06 0100) Pulse Rate: 87 (10/06 0100)  Labs:  Recent Labs  09/03/16 0926 09/03/16 1445 09/03/16 1748 09/03/16 2152 09/04/16 0148 09/04/16 0200  HGB 12.8*  --   --   --   --   --   HCT 37.5*  --   --   --   --   --   PLT 245  --   --   --   --   --   HEPARINUNFRC  --   --   --   --   --  0.81*  CREATININE 4.82* 4.72* 4.36* 4.14* 3.91*  --   TROPONINI 0.07* 0.52*  --  1.14*  --   --     Estimated Creatinine Clearance: 18.4 mL/min (by C-G formula based on SCr of 3.91 mg/dL (H)).   Assessment: 70 yo male with ACS on IV heparin. Heparin level supratherapeutic (0.81) on gtt at 1000 units/hr. No bleeding noted.  Goal of Therapy:  Heparin level 0.3-0.7 units/ml Monitor platelets by anticoagulation protocol: Yes   Plan:  - Decrease heparin drip to 850 units/hr.  - F/u 8 hr heparin level   Christoper Fabian, PharmD, BCPS Clinical pharmacist, pager [phone] 09/04/2016,3:01 AM

## 2016-09-04 NOTE — Progress Notes (Signed)
ANTICOAGULATION CONSULT NOTE - Follow-up Consult  Pharmacy Consult for heparin Indication: chest pain/ACS  No Known Allergies  Patient Measurements: Height: 5\' 10"  (177.8 cm) Weight: 171 lb 15.3 oz (78 kg) IBW/kg (Calculated) : 73 Heparin Dosing Weight: 78 kg  Vital Signs: Temp: 97.9 F (36.6 C) (10/06 1122) Temp Source: Oral (10/06 1122) BP: 148/91 (10/06 1200) Pulse Rate: 91 (10/06 1200)  Labs:  Recent Labs  09/03/16 0926  09/03/16 2152 09/04/16 0148 09/04/16 0200 09/04/16 0524 09/04/16 0930  HGB 12.8*  --   --   --   --  11.8*  --   HCT 37.5*  --   --   --   --  32.8*  --   PLT 245  --   --   --   --  236  --   HEPARINUNFRC  --   --   --   --  0.81*  --  0.58  CREATININE 4.82*  < > 4.14* 3.91*  --  3.78*  --   TROPONINI 0.07*  < > 1.14*  --   --  1.20* 0.99*  < > = values in this interval not displayed.  Estimated Creatinine Clearance: 19 mL/min (by C-G formula based on SCr of 3.78 mg/dL (H)).   Assessment: 70 yo m presenting with AMS/DKA  PMH: DM, CAD, CHF, HTN, GERD  Anticoag: on hep gtt for r/o ACS. On 850 units/hr with lvl of 0.58  Nephro: SCr 3.78 (declining), K 3.3, Phos < 1  Heme/Onc: H&H 11.8/32.8, Plt 236  Goal of Therapy:  Heparin level 0.3-0.7 units/ml Monitor platelets by anticoagulation protocol: Yes   Plan:  F/U cards plans Heparin 850 units/hr Daily HL, CBC  Isaac Bliss, PharmD, BCPS, [facility] Clinical Pharmacist Pager [phone] 09/04/2016 12:44 PM

## 2016-09-04 NOTE — Progress Notes (Signed)
eLink Physician-Brief Progress Note Patient Name: Aaron Santiago DOB: 1946-02-08 MRN: [id number]   Date of Service  09/04/2016  HPI/Events of Note  Basic metabolic panel with sodium 132, potassium 3.2, bicarbonate 25, & improving acute renal failure with creatinine 2.97. Has central venous access in place. No anion gap. Hypocalcemia corrects. Currently on dextrose and heparin infusions.   eICU Interventions  1. KCl 10 mEq IV 6 runs 2. Repeat electrolytes in a.m.      Intervention Category Intermediate Interventions: Electrolyte abnormality - evaluation and management;Other:  Lawanda Cousins 09/04/2016, 10:59 PM

## 2016-09-04 NOTE — Progress Notes (Signed)
CRITICAL VALUE ALERT  Critical value received:  Phosphorus <1  Date of notification:  09/04/16  Time of notification:  0639  Critical value read back:Yes.    Nurse who received alert:  Judeth Cornfield, RN  MD notified (1st page):  Dani Gobble  Time of first page:  [phone]  MD notified (2nd page):  Time of second page:  Responding MD:  Sampson Goon  Time MD responded:  [phone]

## 2016-09-04 NOTE — Progress Notes (Signed)
eLink Physician-Brief Progress Note Patient Name: Aaron Santiago DOB: 20-Jul-1946 MRN: [id number]   Date of Service  09/04/2016  HPI/Events of Note  Low K 3.2 On insulin drip, has AKI but making urine  eICU Interventions  Correct K 20 meqs     Intervention Category Intermediate Interventions: Electrolyte abnormality - evaluation and management  Daneen Schick Dios 09/04/2016, 3:37 AM

## 2016-09-04 NOTE — Progress Notes (Signed)
Patient awake pulling at lines trying to get up , medication given as ordered x one

## 2016-09-04 NOTE — Care Management Note (Signed)
Case Management Note  Patient Details  Name: Aaron Santiago MRN: [id number] Date of Birth: 09-12-1946  Subjective/Objective:    Pt admitted with altered mental status                Action/Plan:  PTA from home with sister.  ETOH  - CSW consulted for substance abuse.  CM will continue to follow for discharge needs   Expected Discharge Date:                  Expected Discharge Plan:     In-House Referral:  Clinical Social Work  Discharge planning Services     Post Acute Care Choice:    Choice offered to:     DME Arranged:    DME Agency:     HH Arranged:    HH Agency:     Status of Service:  In process, will continue to follow  If discussed at Long Length of Stay Meetings, dates discussed:    Additional Comments:  Cherylann Parr, RN 09/04/2016, 3:50 PM

## 2016-09-05 LAB — RENAL FUNCTION PANEL
ALBUMIN: 1.7 g/dL — AB (ref 3.5–5.0)
ANION GAP: 8 (ref 5–15)
BUN: 62 mg/dL — ABNORMAL HIGH (ref 6–20)
CALCIUM: 7.8 mg/dL — AB (ref 8.9–10.3)
CO2: 26 mmol/L (ref 22–32)
CREATININE: 2.62 mg/dL — AB (ref 0.61–1.24)
Chloride: 100 mmol/L — ABNORMAL LOW (ref 101–111)
GFR, EST AFRICAN AMERICAN: 27 mL/min — AB (ref >60)
GFR, EST NON AFRICAN AMERICAN: 23 mL/min — AB (ref >60)
Glucose, Bld: 220 mg/dL — ABNORMAL HIGH (ref 65–99)
PHOSPHORUS: 1.7 mg/dL — AB (ref 2.5–4.6)
Potassium: 3.2 mmol/L — ABNORMAL LOW (ref 3.5–5.1)
SODIUM: 134 mmol/L — AB (ref 135–145)

## 2016-09-05 LAB — CBC
HCT: 32.8 % — ABNORMAL LOW (ref 39.0–52.0)
Hemoglobin: 11.5 g/dL — ABNORMAL LOW (ref 13.0–17.0)
MCH: 27.4 pg (ref 26.0–34.0)
MCHC: 35.1 g/dL (ref 30.0–36.0)
MCV: 78.1 fL (ref 78.0–100.0)
PLATELETS: 191 10*3/uL (ref 150–400)
RBC: 4.2 MIL/uL — ABNORMAL LOW (ref 4.22–5.81)
RDW: 14 % (ref 11.5–15.5)
WBC: 9.2 10*3/uL (ref 4.0–10.5)

## 2016-09-05 LAB — GLUCOSE, CAPILLARY
GLUCOSE-CAPILLARY: 113 mg/dL — AB (ref 65–99)
GLUCOSE-CAPILLARY: 124 mg/dL — AB (ref 65–99)
GLUCOSE-CAPILLARY: 155 mg/dL — AB (ref 65–99)
GLUCOSE-CAPILLARY: 85 mg/dL (ref 65–99)
Glucose-Capillary: 251 mg/dL — ABNORMAL HIGH (ref 65–99)
Glucose-Capillary: 78 mg/dL (ref 65–99)

## 2016-09-05 LAB — TROPONIN I: TROPONIN I: 0.71 ng/mL — AB (ref <0.03)

## 2016-09-05 LAB — MAGNESIUM: Magnesium: 1.7 mg/dL (ref 1.7–2.4)

## 2016-09-05 LAB — PROCALCITONIN: Procalcitonin: 0.58 ng/mL

## 2016-09-05 LAB — HEPARIN LEVEL (UNFRACTIONATED): Heparin Unfractionated: 0.46 IU/mL (ref 0.30–0.70)

## 2016-09-05 MED ORDER — POTASSIUM CHLORIDE 10 MEQ/100ML IV SOLN
10.0000 meq | INTRAVENOUS | Status: AC
  Administered 2016-09-05 (×3): 10 meq via INTRAVENOUS
  Filled 2016-09-05: qty 100

## 2016-09-05 MED ORDER — FAMOTIDINE IN NACL 20-0.9 MG/50ML-% IV SOLN
20.0000 mg | Freq: Two times a day (BID) | INTRAVENOUS | Status: DC
  Administered 2016-09-05: 20 mg via INTRAVENOUS
  Filled 2016-09-05 (×2): qty 50

## 2016-09-05 MED ORDER — SODIUM CHLORIDE 0.9 % IV SOLN
30.0000 mmol | Freq: Once | INTRAVENOUS | Status: AC
  Administered 2016-09-05: 30 mmol via INTRAVENOUS
  Filled 2016-09-05: qty 10

## 2016-09-05 MED ORDER — FAMOTIDINE IN NACL 20-0.9 MG/50ML-% IV SOLN
20.0000 mg | INTRAVENOUS | Status: DC
  Administered 2016-09-06: 20 mg via INTRAVENOUS
  Filled 2016-09-05 (×2): qty 50

## 2016-09-05 MED ORDER — CHLORHEXIDINE GLUCONATE 0.12 % MT SOLN
15.0000 mL | Freq: Two times a day (BID) | OROMUCOSAL | Status: DC
  Administered 2016-09-05 – 2016-09-09 (×8): 15 mL via OROMUCOSAL
  Filled 2016-09-05 (×5): qty 15

## 2016-09-05 MED ORDER — INSULIN DETEMIR 100 UNIT/ML ~~LOC~~ SOLN
8.0000 [IU] | Freq: Once | SUBCUTANEOUS | Status: AC
  Administered 2016-09-05: 8 [IU] via SUBCUTANEOUS
  Filled 2016-09-05: qty 0.08

## 2016-09-05 MED ORDER — HEPARIN SODIUM (PORCINE) 5000 UNIT/ML IJ SOLN
5000.0000 [IU] | Freq: Three times a day (TID) | INTRAMUSCULAR | Status: DC
  Administered 2016-09-05 – 2016-09-07 (×7): 5000 [IU] via SUBCUTANEOUS
  Filled 2016-09-05 (×8): qty 1

## 2016-09-05 MED ORDER — MAGNESIUM SULFATE 2 GM/50ML IV SOLN
2.0000 g | Freq: Once | INTRAVENOUS | Status: AC
  Administered 2016-09-05: 2 g via INTRAVENOUS
  Filled 2016-09-05: qty 50

## 2016-09-05 MED ORDER — INSULIN DETEMIR 100 UNIT/ML ~~LOC~~ SOLN
25.0000 [IU] | SUBCUTANEOUS | Status: DC
  Administered 2016-09-06 – 2016-09-07 (×2): 25 [IU] via SUBCUTANEOUS
  Filled 2016-09-05 (×2): qty 0.25

## 2016-09-05 MED ORDER — ORAL CARE MOUTH RINSE
15.0000 mL | Freq: Two times a day (BID) | OROMUCOSAL | Status: DC
  Administered 2016-09-05 – 2016-09-08 (×6): 15 mL via OROMUCOSAL

## 2016-09-05 NOTE — Progress Notes (Signed)
PULMONARY / CRITICAL CARE MEDICINE   Name: Aaron Santiago MRN: [id number] DOB: 02-26-1946    ADMISSION DATE:  09/03/2016 CONSULTATION DATE:  09/03/2016  REFERRING MD:  [facility]  CHIEF COMPLAINT:  AMS  HISTORY OF PRESENT ILLNESS:  Pt is encephelopathic; therefore, this HPI is obtained from chart review. Aaron Santiago is a 70 y.o. male with PMH as outlined below including but not limited to DM for which he has been non-compliant with meds (reportedly stopped taking 1 month ago).  He presented to [facility] ED on 10/5 via EMS due to AMS.  He apparently lives with his sister who was the one who noticed his decreased / worsening level of consciousness.  He apparently has heavy ETOH hx but stopped drinking 3 days prior to presentation.  In ED, labs were suggestive of DKA vs HONK.  He was felt to be too unstable; therefore, was transferred to [facility] ICU for further evaluation and management.  PAST MEDICAL HISTORY :  He  has a past medical history of Arthritis; CAD (coronary artery disease); Carotid artery stenosis; CHF (congestive heart failure) (HCC); Essential hypertension; GERD (gastroesophageal reflux disease); History of substance abuse; Hyperlipidemia; Peptic ulcer disease; Seasonal allergies; Stomach ulcer (1972); and Type 2 diabetes mellitus (HCC).  PAST SURGICAL HISTORY: He  has a past surgical history that includes Antrectomy (~1972); Cholecystectomy (06/2010); Fracture surgery; Femur fracture surgery (~ 1964); Coronary artery bypass graft (2002); Coronary angioplasty with stent (11/06/11); Knee arthroscopy with medial menisectomy (Right, 06/06/2013); left heart catheterization with coronary angiogram (N/A, 11/06/2011); and percutaneous coronary stent intervention (pci-s) (N/A, 11/06/2011).  No Known Allergies  No current facility-administered medications on file prior to encounter.    Current Outpatient Prescriptions on File Prior to Encounter  Medication Sig  . amLODipine (NORVASC) 5 MG tablet Take  1 tablet (5 mg total) by mouth daily.  . carvedilol (COREG) 12.5 MG tablet TAKE ONE TABLET BY MOUTH TWICE DAILY (Patient taking differently: Take 12.5 mg by mouth two times a day)  . celecoxib (CELEBREX) 200 MG capsule Take 200 mg by mouth daily.  Marland Kitchen DEXILANT 60 MG capsule Take 1 capsule (60 mg total) by mouth every morning.  . escitalopram (LEXAPRO) 20 MG tablet Take 20 mg by mouth daily.   . fluticasone (FLONASE) 50 MCG/ACT nasal spray Place 2 sprays into both nostrils at bedtime.   . insulin detemir (LEVEMIR) 100 UNIT/ML injection Inject 0.4 mLs (40 Units total) into the skin 1 day or 1 dose. Before breakfast (Patient taking differently: Inject 25 Units into the skin every morning. )  . losartan (COZAAR) 50 MG tablet Take 1 tablet (50 mg total) by mouth daily.  . meloxicam (MOBIC) 7.5 MG tablet Take 7.5 mg by mouth 2 (two) times daily.  . SitaGLIPtin-MetFORMIN HCl (JANUMET XR) 50-1000 MG TB24 Take 1 tablet by mouth 2 (two) times daily.  Marland Kitchen aspirin EC 81 MG tablet Take 1 tablet (81 mg total) by mouth daily.  Marland Kitchen gabapentin (NEURONTIN) 400 MG capsule Take 400-800 mg by mouth See admin instructions. 400 mg in the morning then 400 mg in the evening then 800 mg at bedtime  . hydroxypropyl methylcellulose (ISOPTO TEARS) 2.5 % ophthalmic solution Place 1 drop into both eyes 4 (four) times daily as needed for dry eyes.   Marland Kitchen LORazepam (ATIVAN) 1 MG tablet Take 1 mg by mouth every 6 (six) hours as needed for anxiety.   . Multiple Vitamin (MULTIVITAMIN WITH MINERALS) TABS tablet Take 1 tablet by mouth daily.  Marland Kitchen  nitroGLYCERIN (NITROSTAT) 0.4 MG SL tablet Place 1 tablet (0.4 mg total) under the tongue every 5 (five) minutes as needed for chest pain. (up to 3 doses)  . Omega-3 Fatty Acids (FISH OIL) 1200 MG CAPS Take 1 capsule (1,200 mg total) by mouth daily.    FAMILY HISTORY:  His indicated that his mother is alive. He indicated that his father is deceased. He indicated that his sister is alive. He indicated  that his brother is deceased.    SOCIAL HISTORY: He  reports that he has been smoking Cigarettes.  He has a 27.50 pack-year smoking history. He has never used smokeless tobacco. He reports that he drinks alcohol. He reports that he uses drugs, including Hydrocodone, Marijuana, and Cocaine.  REVIEW OF SYSTEMS:   Pt still encephalopathic.   SUBJECTIVE:  Patient drowsy and difficult to awaken for this examiner but has been oriented to name and place for his nurse this am and speaking coherently from time to time, indicating wants (hungry).   VITAL SIGNS: BP 116/79   Pulse 100   Temp 99.7 F (37.6 C) (Core (Comment))   Resp 15   Ht 5\' 10"  (1.778 m)   Wt 78 kg (171 lb 15.3 oz)   SpO2 100%   BMI 24.67 kg/m   HEMODYNAMICS:    VENTILATOR SETTINGS:    INTAKE / OUTPUT: I/O last 3 completed shifts: In: 5303.5 [I.V.:4753.5; IV Piggyback:550] Out: 3085 [Urine:3085]  PHYSICAL EXAMINATION: General: Adult male, resting in bed, in NAD. Neuro: Hypersomnolent but able to be aroused by noxious stimuli. HEENT: [place]/AT. PERRL, sclerae anicteric. MM tacky. Cardiovascular: RRR, no M/R/G.  Lungs: Respirations even and unlabored. Snoring. CTA bilaterally, No W/R/R. Abdomen: +BS, soft, NTND, no rebound or guarding  Musculoskeletal: No gross deformities, no edema.  Skin: Intact, warm, no rashes.  LABS:  BMET  Recent Labs Lab 09/04/16 0524 09/04/16 2136 09/05/16 0500  NA 138 132* 134*  K 3.3* 3.2* 3.2*  CL 98* 99* 100*  CO2 27 25 26   BUN 84* 70* 62*  CREATININE 3.78* 2.97* 2.62*  GLUCOSE 46* 304* 220*    Electrolytes  Recent Labs Lab 09/03/16 0926  09/04/16 0524 09/04/16 2136 09/05/16 0500  CALCIUM 8.1*  < > 8.1* 7.6* 7.8*  MG 2.8*  --  2.1  --  1.7  PHOS  --   --  <1.0*  --  1.7*  < > = values in this interval not displayed.  CBC  Recent Labs Lab 09/03/16 0926 09/04/16 0524 09/05/16 0500  WBC 9.7 12.4* 9.2  HGB 12.8* 11.8* 11.5*  HCT 37.5* 32.8* 32.8*  PLT 245  236 191    Coag's No results for input(s): APTT, INR in the last 168 hours.  Sepsis Markers  Recent Labs Lab 09/03/16 0927 09/03/16 1445 09/04/16 0930 09/05/16 0650  LATICACIDVEN 3.7* 2.9* 1.0  --   PROCALCITON  --   --   --  0.58    ABG  Recent Labs Lab 09/03/16 0853 09/03/16 1324 09/04/16 0920  PHART 7.066* 7.203* 7.370  PCO2ART CRITICAL RESULT CALLED TO, READ BACK BY AND VERIFIED WITH: 23.4* 47.6  PO2ART 165* 127.0* 86.0    Liver Enzymes  Recent Labs Lab 09/03/16 0926 09/05/16 0500  AST 18  --   ALT 24  --   ALKPHOS 121  --   BILITOT 1.9*  --   ALBUMIN 2.3* 1.7*    Cardiac Enzymes  Recent Labs Lab 09/04/16 0524 09/04/16 0930 09/04/16 1638  TROPONINI 1.20*  0.99* 0.71*    Glucose  Recent Labs Lab 09/04/16 1120 09/04/16 1604 09/04/16 2149 09/04/16 2336 09/05/16 0328 09/05/16 0832  GLUCAP 172* 194* 283* 318* 251* 155*    Imaging No results found.  STUDIES:  CXR 10/5 > No focal consolidations.   CULTURES: Blood 10/5 > Urine 10/5 > NG final  ANTIBIOTICS: None.  SIGNIFICANT EVENTS: 10/5 > admitted with DKA vs HONK.  LINES/TUBES: R femoral CVL 10/5 > Foley cath 10/6 >   DISCUSSION: 70 y.o. male with hx DM, non-compliant with outpatient meds x 1 month, presented to [facility] ED 10/5 with AMS. Found to have DKA; transferred to [facility] ICU for further evaluation and management. Able to be transitioned off insulin gtt 10/6. Metabolic acidosis resolved. Concern for alcohol withdrawal given heavy drinking history. CIWA scores elevated overnight but none > 8 to require ativan. Will continue to monitor.   ASSESSMENT / PLAN:  ENDOCRINE A:   DKA vs HONK at presenation. M - non-compliant with meds x 1 month per report.   P:   Transitioned off insulin gtt 10/6. Restarted levemir at 17 U 10/6. 25 U novolog given 10/6. Will increase to last home dose of 25 U lantus daily. Increased SSI to moderate scale. Needs education regarding medication  compliance as outpatient.  PULMONARY A: Initial concern for need to intubate if mental status worsened. Slightly more alert today.  Tobacco dependence. P:   Monitor respiratory status closely Pulmonary hygiene. Tobacco cessation counseling once mental status improves.  CARDIOVASCULAR A:  Troponin leak - suspect due to demand but troponins continuing to rise.  Hx HTN, HLD, CHF (echo from 2012 with EF 60%, G1DD), CAD. History of CABG.  P:  Monitor hemodynamics. Trend troponin - peaked at 1.20  Lactate acidosis resolved   Discontinue heparin per Cardiology.  Heparin DVT prophylaxis. Continue preadmission ASA. Hold preadmission amlodipine, carvedilol, losartan, nitro.  RENAL A:   Pseudohyponatremia - due to hyperglycemia.  Corrects to 137. AGMA - due to DKA (resolved) + uremia. AKI - presumed pre-renal due to hypovolemia. Improved somewhat.  Pseudohypocalcemia (hypoalbuminemia) - corrects to 9.4. Ionized calcium 4.2 10/5.  P:   BMP in AM.  GASTROINTESTINAL A:   Nutrition. HX PUD. P:   NPO due to MS Pepcid 20 mg IV BID Consider allowing diet if more awake later today  HEMATOLOGIC A:   ACS r/o on heparin drip. P:  SCD's / heparin. CBC in AM. Consult Cards for stop date on heparin.   INFECTIOUS A:   No indication of infection. P:   Defer abx for now. Follow cultures as above. PCT 0.58  NEUROLOGIC A:   Acute encephalopathy - due to DKA with possible contribution of EtOH withdrawal. Hx substance abuse (ETOH and cocaine). P:   Limit sedating medications CIWA protocol   Thiamine / Folate. UDS negative.  Substance abuse counseling. Hold preadmssion escitalopram, gabapentin, lorazepam. RAS: -1 Consider head CT if MS worsens  Family updated: None available.  Interdisciplinary Family Meeting v Palliative Care Meeting:  Due by: 09/10/16. RASS goal: 0  Windell Moment, MD 09/05/2016, 9:37 AM   Attending Note:  I have examined patient, reviewed labs,  studies and notes. I have discussed the case with Dr Sampson Goon, and I agree with the data and plans as amended above. 70 yo man with DM and EtOH abuse, presented to [facility] with severe encephalopathy. Found to have metabolic disarray most consistent with HONC and resultant acute renal failure, was started on IVF and insulin. Metabolic status  improved now, off insulin gtt.  On my eval he is more awake but confused, poorly oriented. He was able to converse some. Comfortable resp pattern without crackles. Hemodynamically stable. Renal fxn improved with hydration. Replacing phos and K+ today. Suspect that his lethargy and confusion are due to EtOH withdrawal. Continue CIWA monitoring. Appreciate Cardiology input. Heparin gtt stopped 10/7. No additional cardiac workup needed at this time. Independent critical care time is 35 minutes.   Levy Pupaobert Byrum, MD, PhD 09/05/2016, 12:19 PM [facility] [phone] or if no answer [phone]

## 2016-09-05 NOTE — Progress Notes (Signed)
ANTICOAGULATION CONSULT NOTE - Follow-up Consult  Pharmacy Consult for heparin Indication: chest pain/ACS  No Known Allergies  Patient Measurements: Height: 5\' 10"  (177.8 cm) Weight: 171 lb 15.3 oz (78 kg) IBW/kg (Calculated) : 73 Heparin Dosing Weight: 78 kg  Vital Signs: Temp: 99.7 F (37.6 C) (10/07 0326) Temp Source: Core (Comment) (10/07 0326) BP: 116/79 (10/07 0900) Pulse Rate: 100 (10/07 0900)  Labs:  Recent Labs  09/03/16 0926  09/04/16 0200 09/04/16 0524 09/04/16 0930 09/04/16 1638 09/04/16 2136 09/05/16 0500  HGB 12.8*  --   --  11.8*  --   --   --  11.5*  HCT 37.5*  --   --  32.8*  --   --   --  32.8*  PLT 245  --   --  236  --   --   --  191  HEPARINUNFRC  --   --  0.81*  --  0.58  --   --  0.46  CREATININE 4.82*  < >  --  3.78*  --   --  2.97* 2.62*  TROPONINI 0.07*  < >  --  1.20* 0.99* 0.71*  --   --   < > = values in this interval not displayed.  Estimated Creatinine Clearance: 27.5 mL/min (by C-G formula based on SCr of 2.62 mg/dL (H)).   Assessment: 70 yo m presenting with AMS/DKA  PMH: DM, CAD, CHF, HTN, GERD  Anticoag: on hep gtt for r/o ACS. On 850 units/hr with lvl of 0.58,0.46 (therapeutic x 2)  Nephro: SCr 2.62 - on decline, K 3.2, Phos 1.7  Heme/Onc: H&H 11.5/32.8, Plt 191  Goal of Therapy:  Heparin level 0.3-0.7 units/ml Monitor platelets by anticoagulation protocol: Yes   Plan:  Heparin 850 units/hr Daily HL, CBC F/U Cards consult for d/c of heparin  Isaac Bliss, PharmD, BCPS, [facility] Clinical Pharmacist Pager [phone] 09/05/2016 10:07 AM

## 2016-09-05 NOTE — Progress Notes (Signed)
Patient Name: Aaron CrumblyJerry P Santiago Date of Encounter: 09/05/2016     Active Problems:   DKA (diabetic ketoacidoses) (HCC)   AKI (acute kidney injury) (HCC)   Hypothermia   Severe dehydration   Elevated troponin   Acute encephalopathy    SUBJECTIVE  Awakens to painful stimuli but no purposeful movements and no real response to my questions. Does deny pain.   CURRENT MEDS . aspirin  150 mg Rectal Daily  . chlorhexidine  15 mL Mouth Rinse BID  . famotidine (PEPCID) IV  20 mg Intravenous Q12H  . folic acid  1 mg Oral Daily  . insulin aspart  0-15 Units Subcutaneous Q4H  . [START ON 09/06/2016] insulin detemir  25 Units Subcutaneous Q24H  . insulin detemir  8 Units Subcutaneous Once  . mouth rinse  15 mL Mouth Rinse q12n4p  . metoprolol  2.5 mg Intravenous Q6H  . multivitamin with minerals  1 tablet Oral Daily  . thiamine  100 mg Oral Daily   Or  . thiamine  100 mg Intravenous Daily    OBJECTIVE  Vitals:   09/05/16 0600 09/05/16 0700 09/05/16 0800 09/05/16 0900  BP: (!) 158/83 119/83 125/81 116/79  Pulse: (!) 108 97 97 100  Resp: (!) 28 15 15 15   Temp:      TempSrc:      SpO2: 99% 100% 100% 100%  Weight:      Height:        Intake/Output Summary (Last 24 hours) at 09/05/16 1000 Last data filed at 09/05/16 0900  Gross per 24 hour  Intake          3366.76 ml  Output             2625 ml  Net           741.76 ml   Filed Weights   09/03/16 1337  Weight: 171 lb 15.3 oz (78 kg)    PHYSICAL EXAM  General: somnolent 70 yo man, NAD. Neuro: opens eyes to painful stimuli. Answers yes and no to questions but not oriented.  HEENT:  Normal  Neck: Supple without bruits or JVD. Lungs:  Resp regular and unlabored, CTA. Heart: RRR no s3, s4, or murmurs. Abdomen: Soft, non-tender, non-distended, BS + x 4.  Extremities: No clubbing, cyanosis or edema. DP/PT/Radials 2+ and equal bilaterally.  Accessory Clinical Findings  CBC  Recent Labs  09/03/16 0926 09/04/16 0524  09/05/16 0500  WBC 9.7 12.4* 9.2  NEUTROABS 8.5*  --   --   HGB 12.8* 11.8* 11.5*  HCT 37.5* 32.8* 32.8*  MCV 93.3 77.4* 78.1  PLT 245 236 191   Basic Metabolic Panel  Recent Labs  09/04/16 0524 09/04/16 2136 09/05/16 0500  NA 138 132* 134*  K 3.3* 3.2* 3.2*  CL 98* 99* 100*  CO2 27 25 26   GLUCOSE 46* 304* 220*  BUN 84* 70* 62*  CREATININE 3.78* 2.97* 2.62*  CALCIUM 8.1* 7.6* 7.8*  MG 2.1  --  1.7  PHOS <1.0*  --  1.7*   Liver Function Tests  Recent Labs  09/03/16 0926 09/05/16 0500  AST 18  --   ALT 24  --   ALKPHOS 121  --   BILITOT 1.9*  --   PROT 5.1*  --   ALBUMIN 2.3* 1.7*   No results for input(s): LIPASE, AMYLASE in the last 72 hours. Cardiac Enzymes  Recent Labs  09/04/16 0524 09/04/16 0930 09/04/16 1638  TROPONINI 1.20* 0.99*  0.71*   BNP Invalid input(s): POCBNP D-Dimer No results for input(s): DDIMER in the last 72 hours. Hemoglobin A1C  Recent Labs  09/03/16 1445  HGBA1C 13.1*   Fasting Lipid Panel No results for input(s): CHOL, HDL, LDLCALC, TRIG, CHOLHDL, LDLDIRECT in the last 72 hours. Thyroid Function Tests No results for input(s): TSH, T4TOTAL, T3FREE, THYROIDAB in the last 72 hours.  Invalid input(s): FREET3  TELE  Sinus tachycardia  Radiology/Studies  Ct Head Wo Contrast  Result Date: 08/14/2016 CLINICAL DATA:  Altered mental status EXAM: CT HEAD WITHOUT CONTRAST TECHNIQUE: Contiguous axial images were obtained from the base of the skull through the vertex without intravenous contrast. COMPARISON:  None. FINDINGS: Brain: There is atrophy and chronic small vessel disease changes. No acute intracranial abnormality. Specifically, no hemorrhage, hydrocephalus, mass lesion, acute infarction, or significant intracranial injury. Vascular: No hyperdense vessel or unexpected calcification. Skull: No acute calvarial abnormality. Sinuses/Orbits: Mucosal thickening throughout the paranasal sinuses. No 12 levels. Orbital soft tissues  unremarkable. Other: None IMPRESSION: No acute intracranial abnormality. Mild chronic sinusitis. Electronically Signed   By: Charlett Nose M.D.   On: 08/14/2016 17:09   Dg Chest Portable 1 View  Result Date: 09/03/2016 CLINICAL DATA:  AMS/cad/chf/htn/smoker/hx heart cath/by pass surgery EXAM: PORTABLE CHEST 1 VIEW COMPARISON:  12/02/2015 FINDINGS: Shallow lung inflation. Heart size is probably normal. No focal consolidations or pleural effusions. Remote rib fractures. IMPRESSION: No evidence for acute  abnormality. Electronically Signed   By: Norva Pavlov M.D.   On: 09/03/2016 11:26    ASSESSMENT AND PLAN  1. Elevated troponin - no indication for additional evaluation at this point.  2. CAD - note remote CABG and PCI's. Unless he has evidence of ongoing and significant ischemia, would not recommend any additional cardiac workup. Would stop IV heparin though DVT prophylaxis is reasonable.  3. Alcoholism - he will need ongoing DT prophylaxis.   We will sign off. Please call if he develops evidence of acute ischemia.  Sharlot Gowda Taylor,M.D.  09/05/2016 10:00 AMPatient ID: Aaron Santiago, male   DOB: Dec 13, 1945, 70 y.o.   MRN: [id number]

## 2016-09-06 LAB — CBC
HCT: 29.8 % — ABNORMAL LOW (ref 39.0–52.0)
Hemoglobin: 10.3 g/dL — ABNORMAL LOW (ref 13.0–17.0)
MCH: 27.2 pg (ref 26.0–34.0)
MCHC: 34.6 g/dL (ref 30.0–36.0)
MCV: 78.8 fL (ref 78.0–100.0)
Platelets: 154 10*3/uL (ref 150–400)
RBC: 3.78 MIL/uL — AB (ref 4.22–5.81)
RDW: 14.4 % (ref 11.5–15.5)
WBC: 7 10*3/uL (ref 4.0–10.5)

## 2016-09-06 LAB — GLUCOSE, CAPILLARY
GLUCOSE-CAPILLARY: 138 mg/dL — AB (ref 65–99)
GLUCOSE-CAPILLARY: 67 mg/dL (ref 65–99)
GLUCOSE-CAPILLARY: 113 mg/dL — AB (ref 65–99)
Glucose-Capillary: 183 mg/dL — ABNORMAL HIGH (ref 65–99)
Glucose-Capillary: 76 mg/dL (ref 65–99)
Glucose-Capillary: 132 mg/dL — ABNORMAL HIGH (ref 65–99)

## 2016-09-06 MED ORDER — ASPIRIN 81 MG PO CHEW
81.0000 mg | CHEWABLE_TABLET | Freq: Every day | ORAL | Status: DC
  Administered 2016-09-06 – 2016-09-13 (×7): 81 mg via ORAL
  Filled 2016-09-06 (×7): qty 1

## 2016-09-06 MED ORDER — DEXTROSE 50 % IV SOLN
25.0000 mL | Freq: Once | INTRAVENOUS | Status: AC
  Administered 2016-09-06: 25 mL via INTRAVENOUS
  Filled 2016-09-06: qty 50

## 2016-09-06 MED ORDER — DEXTROSE 50 % IV SOLN
INTRAVENOUS | Status: AC
  Filled 2016-09-06: qty 50

## 2016-09-06 NOTE — Progress Notes (Signed)
PULMONARY / CRITICAL CARE MEDICINE   Name: Aaron Santiago MRN: [id number] DOB: 1946-09-21    ADMISSION DATE:  09/03/2016 CONSULTATION DATE:  09/03/2016  REFERRING MD:  [facility]  CHIEF COMPLAINT:  AMS  HISTORY OF PRESENT ILLNESS:  Pt is encephelopathic; therefore, this HPI is obtained from chart review. Aaron Santiago is a 70 y.o. male with PMH as outlined below including but not limited to DM for which he has been non-compliant with meds (reportedly stopped taking 1 month ago).  He presented to [facility] ED on 10/5 via EMS due to AMS.  He apparently lives with his sister who was the one who noticed his decreased / worsening level of consciousness.  He apparently has heavy ETOH hx but stopped drinking 3 days prior to presentation.  In ED, labs were suggestive of DKA vs HONK.  He was felt to be too unstable; therefore, was transferred to [facility] ICU for further evaluation and management.   SUBJECTIVE:  A bit more awake Got ativan x 2 overnight and has safety mittens on this am   VITAL SIGNS: BP (!) 111/99   Pulse (!) 119   Temp 98.5 F (36.9 C) (Oral)   Resp (!) 21   Ht 5\' 10"  (1.778 m)   Wt 78 kg (171 lb 15.3 oz)   SpO2 98%   BMI 24.67 kg/m   HEMODYNAMICS:    VENTILATOR SETTINGS:    INTAKE / OUTPUT: I/O last 3 completed shifts: In: 3974.8 [I.V.:3014.8; IV Piggyback:960] Out: 6100 [Urine:6100]  PHYSICAL EXAMINATION: General: Adult male, resting in bed, in NAD. Neuro: Hypersomnolent but able to be aroused by noxious stimuli. HEENT: /AT. PERRL, sclerae anicteric. MM tacky. Cardiovascular: RRR, no M/R/G.  Lungs: Respirations even and unlabored. Snoring. CTA bilaterally, No W/R/R. Abdomen: +BS, soft, NTND, no rebound or guarding  Musculoskeletal: No gross deformities, no edema.  Skin: Intact, warm, no rashes.  LABS:  BMET  Recent Labs Lab 09/04/16 0524 09/04/16 2136 09/05/16 0500  NA 138 132* 134*  K 3.3* 3.2* 3.2*  CL 98* 99* 100*  CO2 27 25 26   BUN 84* 70* 62*   CREATININE 3.78* 2.97* 2.62*  GLUCOSE 46* 304* 220*    Electrolytes  Recent Labs Lab 09/03/16 0926  09/04/16 0524 09/04/16 2136 09/05/16 0500  CALCIUM 8.1*  < > 8.1* 7.6* 7.8*  MG 2.8*  --  2.1  --  1.7  PHOS  --   --  <1.0*  --  1.7*  < > = values in this interval not displayed.  CBC  Recent Labs Lab 09/04/16 0524 09/05/16 0500 09/06/16 0427  WBC 12.4* 9.2 7.0  HGB 11.8* 11.5* 10.3*  HCT 32.8* 32.8* 29.8*  PLT 236 191 154    Coag's No results for input(s): APTT, INR in the last 168 hours.  Sepsis Markers  Recent Labs Lab 09/03/16 0927 09/03/16 1445 09/04/16 0930 09/05/16 0650  LATICACIDVEN 3.7* 2.9* 1.0  --   PROCALCITON  --   --   --  0.58    ABG  Recent Labs Lab 09/03/16 0853 09/03/16 1324 09/04/16 0920  PHART 7.066* 7.203* 7.370  PCO2ART CRITICAL RESULT CALLED TO, READ BACK BY AND VERIFIED WITH: 23.4* 47.6  PO2ART 165* 127.0* 86.0    Liver Enzymes  Recent Labs Lab 09/03/16 0926 09/05/16 0500  AST 18  --   ALT 24  --   ALKPHOS 121  --   BILITOT 1.9*  --   ALBUMIN 2.3* 1.7*    Cardiac  Enzymes  Recent Labs Lab 09/04/16 0524 09/04/16 0930 09/04/16 1638  TROPONINI 1.20* 0.99* 0.71*    Glucose  Recent Labs Lab 09/05/16 1224 09/05/16 1551 09/05/16 1929 09/05/16 2351 09/06/16 0411 09/06/16 0800  GLUCAP 124* 78 85 113* 183* 138*    Imaging No results found.  STUDIES:  CXR 10/5 > No focal consolidations.   CULTURES: Blood 10/5 > Urine 10/5 > NG final  ANTIBIOTICS: None.  SIGNIFICANT EVENTS: 10/5 > admitted with DKA vs HONK.  LINES/TUBES: R femoral CVL 10/5 > Foley cath 10/6 >   DISCUSSION: 70 y.o. male with hx DM, non-compliant with outpatient meds x 1 month, presented to [facility] ED 10/5 with AMS. Found to have DKA; [facility] for further evaluation and management. Able to be transitioned off insulin gtt 10/6. Metabolic acidosis resolved. Concern for alcohol withdrawal given heavy drinking history.  CIWA scores elevated overnight but none > 8 to require ativan. Will continue to monitor.   ASSESSMENT / PLAN:  ENDOCRINE A:   DKA vs HONK at presenation. M - non-compliant with meds x 1 month per report.   P:   Transitioned off insulin gtt 10/6. Restarted levemir at 17 U 10/6. 25 U novolog given 10/6 25 U lantus daily. Increased SSI to moderate scale. Needs education regarding medication compliance as outpatient.  PULMONARY A: Initial concern for need to intubate if mental status worsened. Has been able to avoid Tobacco dependence. P:   Monitor respiratory status closely Pulmonary hygiene. Tobacco cessation counseling once mental status improves.  CARDIOVASCULAR A:  Troponin leak - suspect due to demand  Hx HTN, HLD, CHF (echo from 2012 with EF 60%, G1DD), CAD. History of CABG.  P:  Monitor hemodynamics. Trend troponin - peaked at 1.20  Lactate acidosis resolved   Discontinue heparin per Cardiology.  Heparin DVT prophylaxis. Continue preadmission ASA. Hold preadmission amlodipine, carvedilol, losartan, nitro.  RENAL A:   Pseudohyponatremia - due to hyperglycemia.  Corrects to 137. AGMA - due to DKA (resolved) + uremia. AKI - presumed pre-renal due to hypovolemia. Improving Pseudohypocalcemia (hypoalbuminemia) - corrects to 9.4. Ionized calcium 4.2 10/5.  P:   Follow BMP  GASTROINTESTINAL A:   Nutrition. HX PUD. P:   NPO due to MS Pepcid 20 mg IV BID Try to start diet  HEMATOLOGIC A:   DVT prophylaxis P:  SCD's / heparin. Follow CBC  INFECTIOUS A:   No indication of infection. P:   Defer abx for now. Follow cultures as above.  NEUROLOGIC A:   Acute encephalopathy - due to DKA + EtOH withdrawal. Hx substance abuse (ETOH and cocaine). P:   Limit sedating medications CIWA protocol   Thiamine / Folate. UDS negative.  Substance abuse counseling. Hold preadmssion escitalopram, gabapentin, lorazepam.  Family updated: None  available.  Interdisciplinary Family Meeting v Palliative Care Meeting:  Due by: 09/10/16. RASS goal: 0  Possibly to SDU this pm if he continues to improve  Independent critical care time is 32 minutes.   Levy Pupaobert Sloan Galentine, MD, PhD 09/06/2016, 8:50 AM [place] [facility] [phone] or if no answer [phone]

## 2016-09-06 NOTE — Progress Notes (Signed)
eLink Physician-Brief Progress Note Patient Name: Aaron Santiago DOB: Nov 08, 1946 MRN: [id number]   Date of Service  09/06/2016  HPI/Events of Note  Notified by bedside nurse of sustained tachycardia. Telemetry reveals narrow complex QRS. Heart rate in the 120s. Previous EKG reviewed. Patient currently normotensive and sleeping. Not tachypneic. Did not improve with Lopressor or Ativan IV. Does not appear anxious per nurse.   eICU Interventions  Checking EKG      Intervention Category Intermediate Interventions: Arrhythmia - evaluation and management  Lawanda Cousins 09/06/2016, 4:09 AM

## 2016-09-06 NOTE — Progress Notes (Signed)
Pt 1600 CBG was 67. Attempted to give pt orange juice but pt was having difficulty swallowing. Administered 50mL of 50% dextrose per hypoglycemia protocol. Will keep pt NPO and recheck CBG in 15 minutes.

## 2016-09-07 DIAGNOSIS — Z789 Other specified health status: Secondary | ICD-10-CM

## 2016-09-07 DIAGNOSIS — I25110 Atherosclerotic heart disease of native coronary artery with unstable angina pectoris: Secondary | ICD-10-CM

## 2016-09-07 DIAGNOSIS — E876 Hypokalemia: Secondary | ICD-10-CM

## 2016-09-07 DIAGNOSIS — R9431 Abnormal electrocardiogram [ECG] [EKG]: Secondary | ICD-10-CM

## 2016-09-07 DIAGNOSIS — I4729 Other ventricular tachycardia: Secondary | ICD-10-CM

## 2016-09-07 DIAGNOSIS — I472 Ventricular tachycardia: Secondary | ICD-10-CM

## 2016-09-07 LAB — BASIC METABOLIC PANEL
ANION GAP: 11 (ref 5–15)
Anion gap: 7 (ref 5–15)
BUN: 30 mg/dL — ABNORMAL HIGH (ref 6–20)
BUN: 28 mg/dL — AB (ref 6–20)
CALCIUM: 7.9 mg/dL — AB (ref 8.9–10.3)
CHLORIDE: 102 mmol/L (ref 101–111)
CO2: 29 mmol/L (ref 22–32)
CO2: 30 mmol/L (ref 22–32)
CREATININE: 1.59 mg/dL — AB (ref 0.61–1.24)
Calcium: 8 mg/dL — ABNORMAL LOW (ref 8.9–10.3)
Chloride: 99 mmol/L — ABNORMAL LOW (ref 101–111)
Creatinine, Ser: 1.65 mg/dL — ABNORMAL HIGH (ref 0.61–1.24)
GFR calc Af Amer: 49 mL/min — ABNORMAL LOW (ref >60)
GFR calc non Af Amer: 43 mL/min — ABNORMAL LOW (ref >60)
GFR, EST AFRICAN AMERICAN: 47 mL/min — AB (ref >60)
GFR, EST NON AFRICAN AMERICAN: 41 mL/min — AB (ref >60)
GLUCOSE: 121 mg/dL — AB (ref 65–99)
Glucose, Bld: 185 mg/dL — ABNORMAL HIGH (ref 65–99)
POTASSIUM: 3.3 mmol/L — AB (ref 3.5–5.1)
Potassium: 3 mmol/L — ABNORMAL LOW (ref 3.5–5.1)
SODIUM: 139 mmol/L (ref 135–145)
Sodium: 139 mmol/L (ref 135–145)

## 2016-09-07 LAB — GLUCOSE, CAPILLARY
GLUCOSE-CAPILLARY: 179 mg/dL — AB (ref 65–99)
GLUCOSE-CAPILLARY: 87 mg/dL (ref 65–99)
GLUCOSE-CAPILLARY: 95 mg/dL (ref 65–99)
Glucose-Capillary: 120 mg/dL — ABNORMAL HIGH (ref 65–99)
Glucose-Capillary: 149 mg/dL — ABNORMAL HIGH (ref 65–99)
Glucose-Capillary: 174 mg/dL — ABNORMAL HIGH (ref 65–99)
Glucose-Capillary: 89 mg/dL (ref 65–99)

## 2016-09-07 LAB — HEPARIN LEVEL (UNFRACTIONATED): Heparin Unfractionated: 0.29 [IU]/mL — ABNORMAL LOW (ref 0.30–0.70)

## 2016-09-07 LAB — TROPONIN I
TROPONIN I: 2.82 ng/mL — AB (ref <0.03)
Troponin I: 2.55 ng/mL (ref <0.03)

## 2016-09-07 LAB — MAGNESIUM: Magnesium: 1.5 mg/dL — ABNORMAL LOW (ref 1.7–2.4)

## 2016-09-07 LAB — PHOSPHORUS: PHOSPHORUS: 2.9 mg/dL (ref 2.5–4.6)

## 2016-09-07 MED ORDER — SODIUM CHLORIDE 0.9% FLUSH: 3.0000 mL | Freq: Two times a day (BID) | INTRAVENOUS | Status: DC

## 2016-09-07 MED ORDER — GABAPENTIN 300 MG PO CAPS
300.0000 mg | ORAL_CAPSULE | Freq: Three times a day (TID) | ORAL | Status: DC
Start: 2016-09-07 — End: 2016-09-13
  Administered 2016-09-07 – 2016-09-13 (×17): 300 mg via ORAL
  Filled 2016-09-07 (×19): qty 1

## 2016-09-07 MED ORDER — INSULIN DETEMIR 100 UNIT/ML ~~LOC~~ SOLN
10.0000 [IU] | SUBCUTANEOUS | Status: DC
  Administered 2016-09-08: 10 [IU] via SUBCUTANEOUS
  Filled 2016-09-07 (×2): qty 0.1

## 2016-09-07 MED ORDER — SODIUM CHLORIDE 0.9 % IV SOLN: INTRAVENOUS | Status: DC

## 2016-09-07 MED ORDER — CARVEDILOL 3.125 MG PO TABS
3.1250 mg | ORAL_TABLET | Freq: Two times a day (BID) | ORAL | Status: DC
  Administered 2016-09-07 – 2016-09-09 (×3): 3.125 mg via ORAL
  Filled 2016-09-07 (×4): qty 1

## 2016-09-07 MED ORDER — SUCRALFATE 1 GM/10ML PO SUSP
1.0000 g | Freq: Three times a day (TID) | ORAL | Status: DC
  Administered 2016-09-07 – 2016-09-14 (×20): 1 g via ORAL
  Filled 2016-09-07 (×25): qty 10

## 2016-09-07 MED ORDER — MAGNESIUM SULFATE 2 GM/50ML IV SOLN
2.0000 g | Freq: Once | INTRAVENOUS | Status: AC
Start: 2016-09-07 — End: 2016-09-07
  Administered 2016-09-07: 2 g via INTRAVENOUS
  Filled 2016-09-07: qty 50

## 2016-09-07 MED ORDER — INSULIN ASPART 100 UNIT/ML ~~LOC~~ SOLN
0.0000 [IU] | SUBCUTANEOUS | Status: DC
  Administered 2016-09-08 (×2): 2 [IU] via SUBCUTANEOUS
  Administered 2016-09-08: 3 [IU] via SUBCUTANEOUS
  Administered 2016-09-08: 2 [IU] via SUBCUTANEOUS
  Administered 2016-09-09: 1 [IU] via SUBCUTANEOUS
  Administered 2016-09-09: 2 [IU] via SUBCUTANEOUS
  Administered 2016-09-09 – 2016-09-10 (×2): 1 [IU] via SUBCUTANEOUS
  Administered 2016-09-10: 2 [IU] via SUBCUTANEOUS
  Administered 2016-09-10: 3 [IU] via SUBCUTANEOUS
  Administered 2016-09-10 – 2016-09-11 (×2): 1 [IU] via SUBCUTANEOUS
  Administered 2016-09-11: 3 [IU] via SUBCUTANEOUS
  Administered 2016-09-11: 2 [IU] via SUBCUTANEOUS

## 2016-09-07 MED ORDER — POTASSIUM CHLORIDE 10 MEQ/50ML IV SOLN
10.0000 meq | INTRAVENOUS | Status: AC
  Administered 2016-09-07 (×4): 10 meq via INTRAVENOUS
  Filled 2016-09-07 (×4): qty 50

## 2016-09-07 MED ORDER — ASPIRIN 81 MG PO CHEW
81.0000 mg | CHEWABLE_TABLET | ORAL | Status: AC
  Administered 2016-09-08: 81 mg via ORAL
  Filled 2016-09-07: qty 1

## 2016-09-07 MED ORDER — SODIUM CHLORIDE 0.9 % IV SOLN: 250.0000 mL | INTRAVENOUS | Status: DC | PRN

## 2016-09-07 MED ORDER — PRAVASTATIN SODIUM 20 MG PO TABS
20.0000 mg | ORAL_TABLET | Freq: Every day | ORAL | Status: DC
  Administered 2016-09-07 – 2016-09-14 (×8): 20 mg via ORAL
  Filled 2016-09-07 (×8): qty 1

## 2016-09-07 MED ORDER — METOPROLOL TARTRATE 5 MG/5ML IV SOLN
2.5000 mg | Freq: Four times a day (QID) | INTRAVENOUS | Status: DC | PRN
  Administered 2016-09-07 – 2016-09-09 (×3): 2.5 mg via INTRAVENOUS
  Filled 2016-09-07 (×3): qty 5

## 2016-09-07 MED ORDER — SODIUM CHLORIDE 0.9% FLUSH: 3.0000 mL | INTRAVENOUS | Status: DC | PRN

## 2016-09-07 MED ORDER — ESCITALOPRAM OXALATE 20 MG PO TABS
20.0000 mg | ORAL_TABLET | Freq: Every day | ORAL | Status: DC
  Administered 2016-09-07 – 2016-09-14 (×7): 20 mg via ORAL
  Filled 2016-09-07 (×8): qty 1

## 2016-09-07 MED ORDER — HEPARIN (PORCINE) IN NACL 100-0.45 UNIT/ML-% IJ SOLN
950.0000 [IU]/h | INTRAMUSCULAR | Status: DC
  Administered 2016-09-07: 850 [IU]/h via INTRAVENOUS
  Filled 2016-09-07 (×3): qty 250

## 2016-09-07 MED ORDER — GABAPENTIN 600 MG PO TABS
300.0000 mg | ORAL_TABLET | Freq: Three times a day (TID) | ORAL | Status: DC
  Filled 2016-09-07 (×2): qty 0.5

## 2016-09-07 MED ORDER — ONDANSETRON HCL 4 MG/2ML IJ SOLN
4.0000 mg | Freq: Four times a day (QID) | INTRAMUSCULAR | Status: DC | PRN
  Administered 2016-09-07: 4 mg via INTRAVENOUS
  Filled 2016-09-07: qty 2

## 2016-09-07 MED ORDER — PANTOPRAZOLE SODIUM 40 MG PO TBEC
40.0000 mg | DELAYED_RELEASE_TABLET | Freq: Every day | ORAL | Status: DC
  Administered 2016-09-07 – 2016-09-14 (×7): 40 mg via ORAL
  Filled 2016-09-07 (×7): qty 1

## 2016-09-07 MED ORDER — AMLODIPINE BESYLATE 5 MG PO TABS
5.0000 mg | ORAL_TABLET | Freq: Every day | ORAL | Status: DC
  Administered 2016-09-07 – 2016-09-10 (×3): 5 mg via ORAL
  Filled 2016-09-07 (×4): qty 1

## 2016-09-07 MED ORDER — INSULIN ASPART 100 UNIT/ML ~~LOC~~ SOLN
0.0000 [IU] | Freq: Three times a day (TID) | SUBCUTANEOUS | Status: DC
  Administered 2016-09-07: 1 [IU] via SUBCUTANEOUS

## 2016-09-07 NOTE — Progress Notes (Addendum)
[facility] ADULT ICU REPLACEMENT PROTOCOL FOR AM LAB REPLACEMENT ONLY  The patient does apply for the [facility] Adult ICU Electrolyte Replacment Protocol based on the criteria listed below:   1. Is GFR >/= 40 ml/min? Yes.    Patient's GFR today is 41 2. Is urine output >/= 0.5 ml/kg/hr for the last 6 hours? Yes.   Patient's UOP is 0.93 ml/kg/hr 3. Is BUN < 60 mg/dL? Yes.    Patient's BUN today is 30 4. Abnormal electrolyte(s):  K - 3.0 5. Ordered repletion with: PER PROTOCOL 6. If a panic level lab has been reported, has the CCM MD in charge been notified? Yes.  .   Physician:  Dr. Kelli Churn 09/07/2016 4:52 AM

## 2016-09-07 NOTE — Progress Notes (Signed)
ANTICOAGULATION CONSULT NOTE - Initial Consult  Pharmacy Consult for Heparin drip  Indication: chest pain/ACS  No Known Allergies  Patient Measurements: Height: 5\' 10"  (177.8 cm) Weight: 171 lb 15.3 oz (78 kg) IBW/kg (Calculated) : 73 Heparin Dosing Weight: 78 kg  Vital Signs: Temp: 98.6 F (37 C) (10/09 1552) Temp Source: Oral (10/09 1552) BP: 120/78 (10/09 1900) Pulse Rate: 108 (10/09 1900)  Labs:  Recent Labs  09/05/16 0500 09/06/16 0427 09/07/16 0400 09/07/16 1131 09/07/16 1828 09/07/16 1851  HGB 11.5* 10.3*  --   --   --   --   HCT 32.8* 29.8*  --   --   --   --   PLT 191 154  --   --   --   --   HEPARINUNFRC 0.46  --   --   --   --  0.29*  CREATININE 2.62*  --  1.65* 1.59*  --   --   TROPONINI  --   --   --  2.82* 2.55*  --     Estimated Creatinine Clearance: 45.3 mL/min (by C-G formula based on SCr of 1.59 mg/dL (H)).   Medical History: Past Medical History:  Diagnosis Date  . Arthritis   . CAD (coronary artery disease)    LIMA to LAD 2002, DES to circumflex and RCA 2012  . Carotid artery stenosis    Moderate bilateral disease  . CHF (congestive heart failure) (HCC)   . Essential hypertension   . GERD (gastroesophageal reflux disease)   . History of substance abuse    Alcohol and cocaine  . Hyperlipidemia   . Peptic ulcer disease   . Seasonal allergies   . Stomach ulcer 1972   Antrectomy  . Type 2 diabetes mellitus (HCC)     Medications:  Scheduled:  . amLODipine  5 mg Oral Daily  . aspirin  81 mg Oral Daily  . [START ON 09/08/2016] aspirin  81 mg Oral Pre-Cath  . carvedilol  3.125 mg Oral BID WC  . chlorhexidine  15 mL Mouth Rinse BID  . escitalopram  20 mg Oral Daily  . folic acid  1 mg Oral Daily  . gabapentin  300 mg Oral TID  . insulin aspart  0-9 Units Subcutaneous Q4H  . [START ON 09/08/2016] insulin detemir  10 Units Subcutaneous Q24H  . mouth rinse  15 mL Mouth Rinse q12n4p  . multivitamin with minerals  1 tablet Oral Daily   . pantoprazole  40 mg Oral Daily  . potassium chloride  10 mEq Intravenous Q1 Hr x 4  . pravastatin  20 mg Oral q1800  . sodium chloride flush  3 mL Intravenous Q12H  . sucralfate  1 g Oral TID AC  . thiamine  100 mg Oral Daily   Or  . thiamine  100 mg Intravenous Daily    Assessment: 69 YOM continues on heparin for r/o ACS - restarted ~1430 today at previously therapeutic rate of 850 units/hr. Initial HL returns barely subtherapeutic at 0.29. Renal fxn continuing to improve - will increase rate slightly. CBC slowly trending down since admission - Hgb 12.8>>10.3, plt 245>>154. No overt bleeding noted.    Goal of Therapy:  Heparin level 0.3-0.7 units/ml Monitor platelets by anticoagulation protocol: Yes   Plan:  Increase heparin to 950 units/hr HL with AM labs Monitor CBC, s/sx bleeding    Sherle Poe, PharmD Clinical Pharmacist 7:39 PM, 09/07/2016

## 2016-09-07 NOTE — Progress Notes (Signed)
ANTICOAGULATION CONSULT NOTE - Initial Consult  Pharmacy Consult for Heparin drip  Indication: chest pain/ACS  No Known Allergies  Patient Measurements: Height: 5\' 10"  (177.8 cm) Weight: 171 lb 15.3 oz (78 kg) IBW/kg (Calculated) : 73 Heparin Dosing Weight: 78 kg  Vital Signs: Temp: 98.9 F (37.2 C) (10/09 1200) Temp Source: Oral (10/09 1200) BP: 120/89 (10/09 1200) Pulse Rate: 118 (10/09 1200)  Labs:  Recent Labs  09/04/16 1638  09/05/16 0500 09/06/16 0427 09/07/16 0400 09/07/16 1131  HGB  --   --  11.5* 10.3*  --   --   HCT  --   --  32.8* 29.8*  --   --   PLT  --   --  191 154  --   --   HEPARINUNFRC  --   --  0.46  --   --   --   CREATININE  --   < > 2.62*  --  1.65* 1.59*  TROPONINI 0.71*  --   --   --   --   --   < > = values in this interval not displayed.  Estimated Creatinine Clearance: 45.3 mL/min (by C-G formula based on SCr of 1.59 mg/dL (H)).   Medical History: Past Medical History:  Diagnosis Date  . Arthritis   . CAD (coronary artery disease)    LIMA to LAD 2002, DES to circumflex and RCA 2012  . Carotid artery stenosis    Moderate bilateral disease  . CHF (congestive heart failure) (HCC)   . Essential hypertension   . GERD (gastroesophageal reflux disease)   . History of substance abuse    Alcohol and cocaine  . Hyperlipidemia   . Peptic ulcer disease   . Seasonal allergies   . Stomach ulcer 1972   Antrectomy  . Type 2 diabetes mellitus (HCC)     Medications:  Scheduled:  . amLODipine  5 mg Oral Daily  . aspirin  81 mg Oral Daily  . chlorhexidine  15 mL Mouth Rinse BID  . escitalopram  20 mg Oral Daily  . folic acid  1 mg Oral Daily  . gabapentin  300 mg Oral TID  . heparin subcutaneous  5,000 Units Subcutaneous Q8H  . insulin aspart  0-9 Units Subcutaneous Q4H  . [START ON 09/08/2016] insulin detemir  10 Units Subcutaneous Q24H  . mouth rinse  15 mL Mouth Rinse q12n4p  . metoprolol  2.5 mg Intravenous Q6H  . multivitamin with  minerals  1 tablet Oral Daily  . pantoprazole  40 mg Oral Daily  . pravastatin  20 mg Oral q1800  . sucralfate  1 g Oral TID AC  . thiamine  100 mg Oral Daily   Or  . thiamine  100 mg Intravenous Daily    Assessment: [name] with recurrent ACS currently on Heparin SQ and switching to Heparin drip.On 09/03/16, given 1000U/hr and became supra-therapeutic (0.81) and dose was reduced to 850U/hr and remained in therapeutic range. Switched to Heparin SQ 10/07 for resolved chest pain. Now to resume Heparin drip per pharmacy consult. Last Heparin 5000U SQ at 13:00 today. Hgb and platelets have trended down on the admission. Creatinine is improving. No bleeding noted.   Goal of Therapy:  Heparin level 0.3-0.7 units/ml Monitor platelets by anticoagulation protocol: Yes   Plan:  D/c SQ Heparin - No bolus needed Start heparin infusion at 850 units/hr  Check Heparin level in 6 hours  Daily CBC and Heparin level    Aaron Santiago  France Ravens, PharmD Candidate 09/07/2016,1:02 PM

## 2016-09-07 NOTE — Progress Notes (Signed)
eLink Physician-Brief Progress Note Patient Name: Aaron Santiago DOB: 1946/03/21 MRN: [id number]   Date of Service  09/07/2016  HPI/Events of Note  K and Mg replacement  eICU Interventions       Intervention Category Intermediate Interventions: Electrolyte abnormality - evaluation and management  Keval Nam S. 09/07/2016, 4:02 PM

## 2016-09-07 NOTE — Progress Notes (Signed)
PULMONARY / CRITICAL CARE MEDICINE   Name: Aaron Santiago MRN: [id number] DOB: Sep 12, 1946    ADMISSION DATE:  09/03/2016 CONSULTATION DATE:  09/03/2016  REFERRING MD:  [facility]  CHIEF COMPLAINT:  AMS  HISTORY OF PRESENT ILLNESS:  Pt is encephelopathic; therefore, this HPI is obtained from chart review. Aaron Santiago is a 70 y.o. male with PMH as outlined below including but not limited to DM for which he has been non-compliant with meds (reportedly stopped taking 1 month ago).  He presented to [facility] ED on 10/5 via EMS due to AMS.  He apparently lives with his sister who was the one who noticed his decreased / worsening level of consciousness.  He apparently has heavy ETOH hx but stopped drinking 3 days prior to presentation.  In ED, labs were suggestive of DKA vs HONK.  He was felt to be too unstable; therefore, was transferred to [facility] ICU for further evaluation and management.   SUBJECTIVE:  Awakens easily but not making much sense in his speech Received ativan x 1 overnight for CIWA score of 9  VITAL SIGNS: BP 113/82   Pulse (!) 119   Temp 98.9 F (37.2 C) (Oral)   Resp (!) 26   Ht 5\' 10"  (1.778 m)   Wt 171 lb 15.3 oz (78 kg)   SpO2 95%   BMI 24.67 kg/m   HEMODYNAMICS:    VENTILATOR SETTINGS:    INTAKE / OUTPUT: I/O last 3 completed shifts: In: 3497 [P.O.:120; I.V.:2717; IV Piggyback:660] Out: 4600 [Urine:4600]  PHYSICAL EXAMINATION: General: Adult male, resting in bed, in NAD. Neuro: Easily arousible but slow to answer questions and not answering appropriately  HEENT: [place]/AT. PERRL, sclerae anicteric. MM tacky. Cardiovascular: RRR, no M/R/G.  Lungs: Respirations even and unlabored. CTA bilaterally, No W/R/R. Abdomen: +BS, soft, NTND, no rebound or guarding  Musculoskeletal: No gross deformities, no edema.  Skin: Intact, warm, no rashes.  LABS:  BMET  Recent Labs Lab 09/04/16 2136 09/05/16 0500 09/07/16 0400  NA 132* 134* 139  K 3.2* 3.2* 3.0*  CL 99*  100* 99*  CO2 25 26 29   BUN 70* 62* 30*  CREATININE 2.97* 2.62* 1.65*  GLUCOSE 304* 220* 185*    Electrolytes  Recent Labs Lab 09/03/16 0926  09/04/16 0524 09/04/16 2136 09/05/16 0500 09/07/16 0400  CALCIUM 8.1*  < > 8.1* 7.6* 7.8* 7.9*  MG 2.8*  --  2.1  --  1.7  --   PHOS  --   --  <1.0*  --  1.7*  --   < > = values in this interval not displayed.  CBC  Recent Labs Lab 09/04/16 0524 09/05/16 0500 09/06/16 0427  WBC 12.4* 9.2 7.0  HGB 11.8* 11.5* 10.3*  HCT 32.8* 32.8* 29.8*  PLT 236 191 154    Coag's No results for input(s): APTT, INR in the last 168 hours.  Sepsis Markers  Recent Labs Lab 09/03/16 0927 09/03/16 1445 09/04/16 0930 09/05/16 0650  LATICACIDVEN 3.7* 2.9* 1.0  --   PROCALCITON  --   --   --  0.58    ABG  Recent Labs Lab 09/03/16 0853 09/03/16 1324 09/04/16 0920  PHART 7.066* 7.203* 7.370  PCO2ART CRITICAL RESULT CALLED TO, READ BACK BY AND VERIFIED WITH: 23.4* 47.6  PO2ART 165* 127.0* 86.0    Liver Enzymes  Recent Labs Lab 09/03/16 0926 09/05/16 0500  AST 18  --   ALT 24  --   ALKPHOS 121  --  BILITOT 1.9*  --   ALBUMIN 2.3* 1.7*    Cardiac Enzymes  Recent Labs Lab 09/04/16 0524 09/04/16 0930 09/04/16 1638  TROPONINI 1.20* 0.99* 0.71*    Glucose  Recent Labs Lab 09/06/16 1138 09/06/16 1624 09/06/16 1740 09/06/16 1932 09/07/16 0005 09/07/16 0337  GLUCAP 76 67 113* 132* 174* 179*    Imaging No results found.  STUDIES:  CXR 10/5 > No focal consolidations.   CULTURES: Blood 10/5 > Urine 10/5 > NG final  ANTIBIOTICS: None.  SIGNIFICANT EVENTS: 10/5 > admitted with DKA vs HONK.  LINES/TUBES: R femoral CVL 10/5 > Foley cath 10/6 >   DISCUSSION: 70 y.o. male with hx DM, non-compliant with outpatient meds x 1 month, presented to [facility] ED 10/5 with AMS. Found to have DKA; [facility] for further evaluation and management. Able to be transitioned off insulin gtt 10/6. Metabolic  acidosis resolved. Concern for alcohol withdrawal given heavy drinking history. Family revealed he also has history of prescription pain pill abuse. CIWA scores elevated overnight, but timeline of withdrawal seems delayed with last known drink reported earlier last week. Will continue to monitor.   ASSESSMENT / PLAN:  ENDOCRINE A:   DKA vs HONK at presenation. M - non-compliant with meds x 1 month per report.   P:   Transitioned off insulin gtt 10/6.  Levimir at 25 U Will decrease SSI to sensitive, as had sugar to 67 10/8 and still not eating much Needs education regarding medication compliance as outpatient.  PULMONARY A: Initial concern for need to intubate if mental status worsened. Has been able to avoid Tobacco dependence. P:   Monitor respiratory status closely Pulmonary hygiene. Tobacco cessation counseling once mental status improves.  CARDIOVASCULAR A:  Troponin leak - suspect due to demand  Hx HTN, HLD, CHF (echo from 2012 with EF 60%, G1DD), CAD. History of CABG.  P:  Monitor hemodynamics. Trend troponin - peaked at 1.20  Lactate acidosis resolved   Heparin DVT prophylaxis. Continue preadmission ASA. On lopressor IV 2.5 mg q6h Hold preadmission amlodipine, carvedilol, losartan, nitro.  RENAL A:   Pseudohyponatremia - resolved AGMA - due to DKA (resolved) + uremia. AKI - presumed pre-renal due to hypovolemia. Improving Pseudohypocalcemia (hypoalbuminemia) - corrects to 9.4. Ionized calcium 4.2 10/5.  P:   Follow BMP - obtain repeat this pm Replete K  GASTROINTESTINAL A:   Nutrition. HX PUD. P:   NPO due to MS Pepcid 20 mg IV BID Try to start diet  HEMATOLOGIC A:   DVT prophylaxis P:  SCD's / heparin. Follow CBC  INFECTIOUS A:   No indication of infection. P:   Defer abx for now. Follow cultures as above.  NEUROLOGIC A:   Acute encephalopathy - due to DKA + EtOH withdrawal. Hx substance abuse (ETOH and cocaine). P:   Limit  sedating medications CIWA protocol   Thiamine / Folate. UDS negative.  Substance abuse counseling. Hold preadmssion escitalopram, gabapentin, lorazepam.  Family updated: None available.  Interdisciplinary Family Meeting v Palliative Care Meeting:  Spoke with family at bedside 10/7. Due by: 09/10/16. RASS goal: 0  Dani GobbleHillary Miyako Oelke, MD [facility], PGY-2

## 2016-09-07 NOTE — Progress Notes (Signed)
CRITICAL VALUE ALERT  Critical value received:  Troponin 2.82  Date of notification:  09/07/16  Time of notification:  1330  Critical value read back:Yes.    Nurse who received alert:  Tory Emerald, RN  MD notified (1st page):  Zenia Resides, NP  Time of first page:  On floor @ [id number]  MD notified (2nd page):  Time of second page:  Responding MD:  Zenia Resides, NP  Time MD responded: on floor

## 2016-09-07 NOTE — Progress Notes (Signed)
 Patient Name: Aaron Santiago Date of Encounter: 09/07/2016  Primary Cardiologist: Dr. Arida  Hospital Problem List     Active Problems:   DKA (diabetic ketoacidoses) (HCC)   AKI (acute kidney injury) (HCC)   Hypothermia   Severe dehydration   Elevated troponin   Acute encephalopathy    Subjective   Says he is having right sided chest pain and epigastric pain.   Inpatient Medications    Scheduled Meds: . amLODipine  5 mg Oral Daily  . aspirin  81 mg Oral Daily  . chlorhexidine  15 mL Mouth Rinse BID  . escitalopram  20 mg Oral Daily  . folic acid  1 mg Oral Daily  . gabapentin  300 mg Oral TID  . heparin subcutaneous  5,000 Units Subcutaneous Q8H  . insulin aspart  0-9 Units Subcutaneous Q4H  . [START ON 09/08/2016] insulin detemir  10 Units Subcutaneous Q24H  . mouth rinse  15 mL Mouth Rinse q12n4p  . metoprolol  2.5 mg Intravenous Q6H  . multivitamin with minerals  1 tablet Oral Daily  . pantoprazole  40 mg Oral Daily  . pravastatin  20 mg Oral q1800  . sucralfate  1 g Oral TID AC  . thiamine  100 mg Oral Daily   Or  . thiamine  100 mg Intravenous Daily   Continuous Infusions: . dextrose 5 % and 0.45% NaCl 75 mL/hr (09/06/16 2349)   PRN Meds:.LORazepam **OR** LORazepam, ondansetron (ZOFRAN) IV   Vital Signs    Vitals:   09/07/16 0900 09/07/16 1000 09/07/16 1100 09/07/16 1200  BP: 127/84 (!) 144/111 123/86 120/89  Pulse: (!) 35 (!) 130 (!) 121 (!) 118  Resp: (!) 27 16 (!) 45 15  Temp:    98.9 F (37.2 C)  TempSrc:    Oral  SpO2: (!) 86% 96% 93% 92%  Weight:      Height:        Intake/Output Summary (Last 24 hours) at 09/07/16 1254 Last data filed at 09/07/16 1100  Gross per 24 hour  Intake             1995 ml  Output             1415 ml  Net              580 ml   Filed Weights   09/03/16 1337  Weight: 171 lb 15.3 oz (78 kg)    Physical Exam    GEN: ill appearing male in no acute distress.  HEENT: Grossly normal, poor dentition Neck:  Supple, no JVD, carotid bruits, or masses. Cardiac: RRR, no murmurs, rubs, or gallops. No clubbing, cyanosis, edema.  Radials/DP/PT 2+ and equal bilaterally.  Respiratory:  Respirations regular and unlabored, clear to auscultation bilaterally. GI: Soft, nontender, nondistended, BS + x 4. MS: no deformity or atrophy. Skin: warm and dry, no rash. Neuro:  Strength and sensation are intact. Psych: AAOx3.  Lethargic  Labs    CBC  Recent Labs  09/05/16 0500 09/06/16 0427  WBC 9.2 7.0  HGB 11.5* 10.3*  HCT 32.8* 29.8*  MCV 78.1 78.8  PLT 191 154   Basic Metabolic Panel  Recent Labs  09/05/16 0500 09/07/16 0400 09/07/16 1131  NA 134* 139 139  K 3.2* 3.0* 3.3*  CL 100* 99* 102  CO2 26 29 30  GLUCOSE 220* 185* 121*  BUN 62* 30* 28*  CREATININE 2.62* 1.65* 1.59*  CALCIUM 7.8* 7.9* 8.0*  MG 1.7  --   --     PHOS 1.7*  --  2.9   Liver Function Tests  Recent Labs  09/05/16 0500  ALBUMIN 1.7*   Cardiac Enzymes  Recent Labs  09/04/16 1638  TROPONINI 0.71*     Telemetry    NSR, had run of NSVT- Personally Reviewed  ECG    NSR, deep t wave inversion in anterolateral leads, new when compared to old. - Personally Reviewed  Radiology    No results found.  Patient Profile     Mr. Aaron Santiago is a 69 year old male who presented with DKA and AMS. He has a long history of ETOH abuse and tobacco abuse. Cardiology was initially consulted for elevated troponin of 1.14 on 09/03/16 in the setting of DKA. Now with worsening EKG and NSVT.   Assessment & Plan    1. Abnormal EKG and chest pain: This am patient had chest pain and epigastric pain and a run of NSVT. EKG now with ST depression, and T wave inversion in anterolateral leads. He has a known history of CAD, with one vessel CABG from LIMA-LAD. There is some concern given his EKG changes for graft occlusion. However, patient is encephalopathic currently, and has acute renal failure.   He is lethargic on exam, says he is  having some right sided chest pain but it is unclear if he is describing epigastric pain. Troponin pending.   He will eventually need cath, but hesitant to do today given his finally improving renal status, although Scr is 1.59 and GFR is 43 today. Also, patient is lethargic, borderline obtunded.   Will start heparin gtt, follow troponin. Will also follow Echo results. Will start him back on his home Coreg at reduced dose - 3.125mg BID. Will keep IV Metoprolol 2.5 mg prn  6 for HR>120.   2. History of CAD s/p CABG: See discussion above.   3. DKA: Management per primary team  Signed, Erin E Smith, NP  09/07/2016, 12:54 PM   I have seen, examined and evaluated the patient this PM along with Erin Smith, NP.  After reviewing all the available data and chart, we discussed the patients laboratory, study & physical findings as well as symptoms in detail. I agree with her findings, examination as well as impression recommendations as per our discussion.    Patient with known CAD and had single-vessel CABG with LIMA to LAD and then PCI to the native arteries who was admitted for DKA him initially an elevated troponin level thought to be related to demand ischemia. However today he did have an episode is hard to tell as far symptom wise with his epigastric or chest discomfort, but associated with tach sinus tachycardia, new anterior lateral ST depressions. He also then had a prolonged run of nonsustained V. Tach.  At this point I think more concerned with acute coronary event then simply demand ischemia. Unfortunately, he still remains somewhat confused and disoriented, and his renal function is finally now getting back into the normal zone. I would very much prefer to not taken to the catheterization was his recurrent symptoms or arrhythmias.  He was cycling cardiac enzymes, would go ahead and start IV heparin infusion. He is oriented on Plavix. I will restart low-dose carvedilol and she was on higher  dose at home. We'll also keep IV Lopressor for when necessary tachycardia/hypertension.  Would cycle cardiac enzymes but if trend appears that this is a new acute coronary syndrome, think he probably would benefit from considering cardiac catheterization as early as tomorrow. With   these EKG changes on very much concerned about anterior ischemia.  Would make nothing by mouth after midnight.  Kenslei Hearty, M.D., M.S. Interventional Cardiologist   Pager # [phone] Phone # [phone] [address] [place], [place] [address]    

## 2016-09-07 NOTE — Care Management Important Message (Signed)
                                        Important Message  Patient Details  Name: Aaron Santiago MRN: [id number] Date of Birth: [birth year]   Medicare Important Message Given:  Yes    [name] 09/07/2016, 10:08 AM

## 2016-09-07 NOTE — Progress Notes (Signed)
PT Cancellation Note  Patient Details Name: Aaron Santiago MRN: [id number] DOB: 1946-11-12   Cancelled Treatment:    Reason Eval/Treat Not Completed: Medical issues which prohibited therapy (pt in SVT and awaiting troponin values)   Toney Sang Beth 09/07/2016, 1:19 PM Delaney Meigs, PT [phone]

## 2016-09-07 NOTE — Progress Notes (Addendum)
PULMONARY / CRITICAL CARE MEDICINE   Name: Aaron Santiago MRN: [id number] DOB: 09-28-1946    ADMISSION DATE:  09/03/2016 CONSULTATION DATE:  09/03/2016  REFERRING MD:  [facility]  CHIEF COMPLAINT:  AMS  HISTORY OF PRESENT ILLNESS:  Pt is encephelopathic; therefore, this HPI is obtained from chart review. Aaron Santiago is a 70 y.o. male with PMH as outlined below including but not limited to DM for which he has been non-compliant with meds (reportedly stopped taking 1 month ago).  He presented to [facility] ED on 10/5 via EMS due to AMS.  He apparently lives with his sister who was the one who noticed his decreased / worsening level of consciousness.  He apparently has heavy ETOH hx but stopped drinking 3 days prior to presentation.  In ED, labs were suggestive of DKA vs HONK.  He was felt to be too unstable; therefore, was transferred to [facility] ICU for further evaluation and management.  SUBJECTIVE: Still with limited oral intake. Patient reported chest discomfort this morning with a run of tachycardia. Patient having abdominal discomfort and some slurred speech. Still altered.   REVIEW OF SYSTEMS:  Unable to obtain with altered mentation.   VITAL SIGNS: BP (!) 144/111   Pulse (!) 130   Temp 99 F (37.2 C) (Oral)   Resp 16   Ht 5\' 10"  (1.778 m)   Wt 171 lb 15.3 oz (78 kg)   SpO2 96%   BMI 24.67 kg/m   HEMODYNAMICS:    VENTILATOR SETTINGS:    INTAKE / OUTPUT: I/O last 3 completed shifts: In: 2845 [P.O.:120; I.V.:2625; IV Piggyback:100] Out: 3710 [Urine:3710]  PHYSICAL EXAMINATION: General: Adult male. Sleeping until awoken. No distress. Neuro: Following commands. Moving extremities. No oriented to place. HEENT: No scleral icterus. Moist membranes. Pupils equal.  Cardiovascular: Regular rhythm. No edema.  Lungs: Normal work of breathing on room air. Clear on auscultation. Abdomen: Soft. Normal bowel sounds. Tender to palpation in epigastrum.  Musculoskeletal: No gross deformities,  no edema.  Skin: Intact, warm, no rashes.  LABS:  BMET  Recent Labs Lab 09/04/16 2136 09/05/16 0500 09/07/16 0400  NA 132* 134* 139  K 3.2* 3.2* 3.0*  CL 99* 100* 99*  CO2 25 26 29   BUN 70* 62* 30*  CREATININE 2.97* 2.62* 1.65*  GLUCOSE 304* 220* 185*    Electrolytes  Recent Labs Lab 09/03/16 0926  09/04/16 0524 09/04/16 2136 09/05/16 0500 09/07/16 0400  CALCIUM 8.1*  < > 8.1* 7.6* 7.8* 7.9*  MG 2.8*  --  2.1  --  1.7  --   PHOS  --   --  <1.0*  --  1.7*  --   < > = values in this interval not displayed.  CBC  Recent Labs Lab 09/04/16 0524 09/05/16 0500 09/06/16 0427  WBC 12.4* 9.2 7.0  HGB 11.8* 11.5* 10.3*  HCT 32.8* 32.8* 29.8*  PLT 236 191 154    Coag's No results for input(s): APTT, INR in the last 168 hours.  Sepsis Markers  Recent Labs Lab 09/03/16 0927 09/03/16 1445 09/04/16 0930 09/05/16 0650  LATICACIDVEN 3.7* 2.9* 1.0  --   PROCALCITON  --   --   --  0.58    ABG  Recent Labs Lab 09/03/16 0853 09/03/16 1324 09/04/16 0920  PHART 7.066* 7.203* 7.370  PCO2ART CRITICAL RESULT CALLED TO, READ BACK BY AND VERIFIED WITH: 23.4* 47.6  PO2ART 165* 127.0* 86.0    Liver Enzymes  Recent Labs Lab 09/03/16 0926  09/05/16 0500  AST 18  --   ALT 24  --   ALKPHOS 121  --   BILITOT 1.9*  --   ALBUMIN 2.3* 1.7*    Cardiac Enzymes  Recent Labs Lab 09/04/16 0524 09/04/16 0930 09/04/16 1638  TROPONINI 1.20* 0.99* 0.71*    Glucose  Recent Labs Lab 09/06/16 1624 09/06/16 1740 09/06/16 1932 09/07/16 0005 09/07/16 0337 09/07/16 0758  GLUCAP 67 113* 132* 174* 179* 149*    Imaging No results found.  STUDIES:  CXR 10/5 > No focal consolidations.   MICROBIOLOGY: MRSA PCR 10/5:  Negative Blood Ctx x2 10/5 >> Urine Ctx 10/5 >>  ANTIBIOTICS: None.  SIGNIFICANT EVENTS: 10/5 - admitted with DKA vs HONK.  LINES/TUBES: R Femoral CVL 10/5 >> Foley 10/6 >>  ASSESSMENT / PLAN:  ENDOCRINE A:   DKA vs HONK -  Resolved & off insulin gtt 10/6. H/o noncompliance for 1 month per report. Hypoglycemia - Resolved w/ D5 1/2 NS IVF.  P:   Decrease Levemir to 10u qAM Continue D51/2 NS for now SSI per Sensitive Algorithm  Accu-Checks q4hr until taking sufficient PO Needs education regarding medication compliance as outpatient.  PULMONARY A: Tobacco Use Disorder  P:   Continuous Pulse Oximetry Tobacco cessation counseling prior to discharge  CARDIOVASCULAR A:  Chest Pain/Discomfort - During wide-complex tachycardia. SVT Elevated Troponin I - Thought secondary to acidosis & demand ischemia. Peaked at 1.20. Hx HTN, HLD, CHF (echo from 2012 with EF 60%, G1DD), CAD. History of CABG.   P:  Vitals per Unit Protocol Continuous Telemetry monitoring Restarting Heparin gtt per Cardiology recs Trending Troponin I q6hr Checking TTE Continue Lopressor 2.5mg  IV q6hr & ASA 81mg  daily Hold preadmission carvedilol, losartan, nitro. Restart home Norvasc 5mg  daily Restart home Pravachol 20mg  daily  RENAL A:   Pseudohyponatremia - Resolved. Hypokalemia - Replacing IV. AGMA - Due to DKA. Resolved. Acute Renal Failure - Resolving. Due to hypovolemia.  Pseudohypocalcemia (hypoalbuminemia) - corrects to 9.4. Ionized calcium 4.2 10/5.   P:   Repeat electrolytes at 1300 Trending renal function & electrolytes daily Replacing electrolytes as indicated  GASTROINTESTINAL A:   Epigastric Abdominal Discomfort - After orange juice.  H/O PUD  P:   Advancing diet as mental status allows D/C Pepcid Protonix PO daily Restarting Carafate 2gm suspension tid  HEMATOLOGIC A:   Anemia - No signs of active bleeding.  P:  Trending cell counts daily w/ CBC SCDs Heparin [place] q8hr  INFECTIOUS A:   No indication of infection.  P:   Defer abx for now. Follow cultures as above.  NEUROLOGIC A:   Acute encephalopathy - Due to DKA + EtOH withdrawal. Hx substance abuse (ETOH and cocaine).  P:   Limit  sedating medications CIWA protocol   Thiamine / Folate / Multivitamin Substance abuse counseling as mental status improves Hold preadmssion lorazepam Restarting home Lexapro Restarting Gabapentin 300mg  po tid (different from home dose)  Family updated: None available.  Interdisciplinary Family Meeting v Palliative Care Meeting:  Due by: 09/10/16.  TODAY'S SUMMARY:  70 y.o. male with hx DM, non-compliant with outpatient meds x 1 month, presented to [facility] ED 10/5 with AMS. Found to have DKA; [facility] for further evaluation and management. Able to be transitioned off insulin gtt 10/6. Metabolic acidosis resolved. Still with some altered mentation but overall nonfocal. Restarting home PPI & carafate. Also restarting some of his home mediations as his renal function improves. Checking TTE & continuing to trend Troponin  I given SVT. Replacing electrolytes. Transferring to SDU given continued stability.  TRH to assume care & PCCM off as of 10/10.  Donna Christen Jamison Neighbor, M.D. [facility] Pulmonary & Critical Care Pager:  [phone] After 3pm or if no response, call [phone] 09/07/2016, 11:19 AM

## 2016-09-07 NOTE — Progress Notes (Addendum)
Inpatient Diabetes Program Recommendations  AACE/ADA: New Consensus Statement on Inpatient Glycemic Control (2015)  Target Ranges:  Prepandial:   less than 140 mg/dL      Peak postprandial:   less than 180 mg/dL (1-2 hours)      Critically ill patients:  140 - 180 mg/dL   Lab Results  Component Value Date   GLUCAP 149 (H) 09/07/2016   HGBA1C 13.1 (H) 09/03/2016    Review of Glycemic ControlResults for MALECHI, GALLENTINE (MRN [id number]) as of 09/07/2016 10:36  Ref. Range 09/06/2016 17:40 09/06/2016 19:32 09/07/2016 00:05 09/07/2016 03:37 09/07/2016 07:58  Glucose-Capillary Latest Ref Range: 65 - 99 mg/dL 092 (H) 957 (H) 473 (H) 179 (H) 149 (H)   Diabetes history: Type 2 diabetes Outpatient Diabetes medications: Levemir 25 units daily-not taking for the past month-See's Dr. Shawnee Knapp Current orders for Inpatient glycemic control:  Novolog sensitive tid with meals, Levemir 25 units q AM  Inpatient Diabetes Program Recommendations:    Note elevated A1C.  Agree with current orders.  Will see patient to discuss elevated A1C.  Thanks, Beryl Meager, RN, BC-ADM Inpatient Diabetes Coordinator Pager [phone] (8a-5p)

## 2016-09-08 ENCOUNTER — Encounter (HOSPITAL_COMMUNITY): Payer: Self-pay | Admitting: Cardiology

## 2016-09-08 ENCOUNTER — Encounter (HOSPITAL_COMMUNITY)
Admission: EM | Disposition: A | Discharge status: Skilled Nursing Facility | Payer: Self-pay | Source: Home / Self Care | Attending: Emergency Medicine

## 2016-09-08 DIAGNOSIS — I214 Non-ST elevation (NSTEMI) myocardial infarction: Secondary | ICD-10-CM

## 2016-09-08 DIAGNOSIS — F1010 Alcohol abuse, uncomplicated: Secondary | ICD-10-CM

## 2016-09-08 DIAGNOSIS — E0810 Diabetes mellitus due to underlying condition with ketoacidosis without coma: Secondary | ICD-10-CM

## 2016-09-08 HISTORY — PX: CARDIAC CATHETERIZATION: SHX172

## 2016-09-08 LAB — CBC WITH DIFFERENTIAL/PLATELET
BASOS ABS: 0 10*3/uL (ref 0.0–0.1)
BASOS PCT: 0 %
Eosinophils Absolute: 0.1 10*3/uL (ref 0.0–0.7)
Eosinophils Relative: 1 %
HEMATOCRIT: 28.5 % — AB (ref 39.0–52.0)
HEMOGLOBIN: 9.7 g/dL — AB (ref 13.0–17.0)
LYMPHS PCT: 17 %
Lymphs Abs: 0.7 10*3/uL (ref 0.7–4.0)
MCH: 27.6 pg (ref 26.0–34.0)
MCHC: 34 g/dL (ref 30.0–36.0)
MCV: 81.2 fL (ref 78.0–100.0)
Monocytes Absolute: 0.6 10*3/uL (ref 0.1–1.0)
Monocytes Relative: 13 %
NEUTROS ABS: 2.9 10*3/uL (ref 1.7–7.7)
NEUTROS PCT: 69 %
Platelets: 197 10*3/uL (ref 150–400)
RBC: 3.51 MIL/uL — AB (ref 4.22–5.81)
RDW: 15.2 % (ref 11.5–15.5)
WBC: 4.2 10*3/uL (ref 4.0–10.5)

## 2016-09-08 LAB — CULTURE, BLOOD (ROUTINE X 2)
Culture: NO GROWTH
Culture: NO GROWTH

## 2016-09-08 LAB — HEPARIN LEVEL (UNFRACTIONATED): Heparin Unfractionated: 0.39 IU/mL (ref 0.30–0.70)

## 2016-09-08 LAB — RENAL FUNCTION PANEL
ALBUMIN: 1.6 g/dL — AB (ref 3.5–5.0)
ANION GAP: 10 (ref 5–15)
BUN: 24 mg/dL — ABNORMAL HIGH (ref 6–20)
CALCIUM: 7.9 mg/dL — AB (ref 8.9–10.3)
CO2: 28 mmol/L (ref 22–32)
Chloride: 101 mmol/L (ref 101–111)
Creatinine, Ser: 1.45 mg/dL — ABNORMAL HIGH (ref 0.61–1.24)
GFR, EST AFRICAN AMERICAN: 55 mL/min — AB (ref >60)
GFR, EST NON AFRICAN AMERICAN: 48 mL/min — AB (ref >60)
GLUCOSE: 168 mg/dL — AB (ref 65–99)
PHOSPHORUS: 3.6 mg/dL (ref 2.5–4.6)
POTASSIUM: 3.6 mmol/L (ref 3.5–5.1)
SODIUM: 139 mmol/L (ref 135–145)

## 2016-09-08 LAB — GLUCOSE, CAPILLARY
GLUCOSE-CAPILLARY: 140 mg/dL — AB (ref 65–99)
GLUCOSE-CAPILLARY: 187 mg/dL — AB (ref 65–99)
GLUCOSE-CAPILLARY: 197 mg/dL — AB (ref 65–99)
Glucose-Capillary: 162 mg/dL — ABNORMAL HIGH (ref 65–99)
Glucose-Capillary: 164 mg/dL — ABNORMAL HIGH (ref 65–99)
Glucose-Capillary: 174 mg/dL — ABNORMAL HIGH (ref 65–99)
Glucose-Capillary: 219 mg/dL — ABNORMAL HIGH (ref 65–99)
Glucose-Capillary: 193 mg/dL — ABNORMAL HIGH (ref 65–99)

## 2016-09-08 LAB — PROTIME-INR
INR: 0.95
PROTHROMBIN TIME: 12.7 s (ref 11.4–15.2)

## 2016-09-08 LAB — MAGNESIUM: MAGNESIUM: 1.9 mg/dL (ref 1.7–2.4)

## 2016-09-08 LAB — TROPONIN I: TROPONIN I: 1.91 ng/mL — AB (ref <0.03)

## 2016-09-08 SURGERY — LEFT HEART CATH AND CORS/GRAFTS ANGIOGRAPHY

## 2016-09-08 MED ORDER — IOPAMIDOL (ISOVUE-370) INJECTION 76%
INTRAVENOUS | Status: AC
  Filled 2016-09-08: qty 50

## 2016-09-08 MED ORDER — IOPAMIDOL (ISOVUE-370) INJECTION 76%
INTRAVENOUS | Status: AC
  Filled 2016-09-08: qty 100

## 2016-09-08 MED ORDER — LIDOCAINE HCL (PF) 1 % IJ SOLN
INTRAMUSCULAR | Status: DC | PRN
  Administered 2016-09-08: 2 mL via INTRADERMAL

## 2016-09-08 MED ORDER — MAGNESIUM OXIDE 400 (241.3 MG) MG PO TABS
400.0000 mg | ORAL_TABLET | Freq: Two times a day (BID) | ORAL | Status: DC
  Administered 2016-09-08 – 2016-09-12 (×8): 400 mg via ORAL
  Filled 2016-09-08 (×9): qty 1

## 2016-09-08 MED ORDER — LORAZEPAM 1 MG PO TABS
1.0000 mg | ORAL_TABLET | ORAL | Status: DC | PRN
  Administered 2016-09-09: 1 mg via ORAL
  Filled 2016-09-08: qty 1

## 2016-09-08 MED ORDER — HEPARIN SODIUM (PORCINE) 5000 UNIT/ML IJ SOLN
5000.0000 [IU] | Freq: Three times a day (TID) | INTRAMUSCULAR | Status: DC
  Administered 2016-09-08 – 2016-09-10 (×6): 5000 [IU] via SUBCUTANEOUS
  Filled 2016-09-08 (×7): qty 1

## 2016-09-08 MED ORDER — SODIUM CHLORIDE 0.9 % IV SOLN: 250.0000 mL | INTRAVENOUS | Status: DC | PRN

## 2016-09-08 MED ORDER — LIDOCAINE HCL (PF) 1 % IJ SOLN
INTRAMUSCULAR | Status: AC
  Filled 2016-09-08: qty 30

## 2016-09-08 MED ORDER — POTASSIUM CHLORIDE CRYS ER 20 MEQ PO TBCR
20.0000 meq | EXTENDED_RELEASE_TABLET | Freq: Once | ORAL | Status: AC
  Administered 2016-09-08: 20 meq via ORAL
  Filled 2016-09-08: qty 1

## 2016-09-08 MED ORDER — HEPARIN (PORCINE) IN NACL 2-0.9 UNIT/ML-% IJ SOLN
INTRAMUSCULAR | Status: DC | PRN
  Administered 2016-09-08: 1000 mL

## 2016-09-08 MED ORDER — MORPHINE SULFATE (PF) 2 MG/ML IV SOLN
2.0000 mg | INTRAVENOUS | Status: DC | PRN
  Administered 2016-09-09 – 2016-09-11 (×7): 2 mg via INTRAVENOUS
  Filled 2016-09-08 (×7): qty 1

## 2016-09-08 MED ORDER — SODIUM CHLORIDE 0.9% FLUSH
3.0000 mL | Freq: Two times a day (BID) | INTRAVENOUS | Status: DC
  Administered 2016-09-08 – 2016-09-10 (×5): 3 mL via INTRAVENOUS

## 2016-09-08 MED ORDER — MORPHINE SULFATE (PF) 4 MG/ML IV SOLN
4.0000 mg | Freq: Once | INTRAVENOUS | Status: AC
  Administered 2016-09-08: 4 mg via INTRAVENOUS
  Filled 2016-09-08: qty 1

## 2016-09-08 MED ORDER — OXYCODONE-ACETAMINOPHEN 5-325 MG PO TABS
1.0000 | ORAL_TABLET | ORAL | Status: DC | PRN
  Administered 2016-09-08 – 2016-09-11 (×11): 1 via ORAL
  Filled 2016-09-08 (×11): qty 1

## 2016-09-08 MED ORDER — HEPARIN (PORCINE) IN NACL 2-0.9 UNIT/ML-% IJ SOLN
INTRAMUSCULAR | Status: AC
  Filled 2016-09-08: qty 1000

## 2016-09-08 MED ORDER — SODIUM CHLORIDE 0.9 % WEIGHT BASED INFUSION
3.0000 mL/kg/h | INTRAVENOUS | Status: AC
  Administered 2016-09-08 (×2): 3 mL/kg/h via INTRAVENOUS

## 2016-09-08 MED ORDER — VERAPAMIL HCL 2.5 MG/ML IV SOLN
INTRAVENOUS | Status: AC
  Filled 2016-09-08: qty 2

## 2016-09-08 MED ORDER — IOPAMIDOL (ISOVUE-370) INJECTION 76%
INTRAVENOUS | Status: DC | PRN
  Administered 2016-09-08: 80 mL via INTRAVENOUS

## 2016-09-08 MED ORDER — VERAPAMIL HCL 2.5 MG/ML IV SOLN
INTRAVENOUS | Status: DC | PRN
  Administered 2016-09-08: 10 mL via INTRA_ARTERIAL

## 2016-09-08 MED ORDER — POTASSIUM CHLORIDE 20 MEQ PO PACK: 20.0000 meq | PACK | Freq: Once | ORAL | Status: DC

## 2016-09-08 MED ORDER — SODIUM CHLORIDE 0.9% FLUSH: 3.0000 mL | INTRAVENOUS | Status: DC | PRN

## 2016-09-08 MED ORDER — HEPARIN SODIUM (PORCINE) 1000 UNIT/ML IJ SOLN
INTRAMUSCULAR | Status: AC
  Filled 2016-09-08: qty 1

## 2016-09-08 MED ORDER — HEPARIN SODIUM (PORCINE) 1000 UNIT/ML IJ SOLN
INTRAMUSCULAR | Status: DC | PRN
  Administered 2016-09-08: 3500 [IU] via INTRAVENOUS

## 2016-09-08 MED ORDER — INSULIN DETEMIR 100 UNIT/ML ~~LOC~~ SOLN
8.0000 [IU] | SUBCUTANEOUS | Status: DC
  Administered 2016-09-09 – 2016-09-10 (×2): 8 [IU] via SUBCUTANEOUS
  Filled 2016-09-08 (×4): qty 0.08

## 2016-09-08 SURGICAL SUPPLY — 9 items
CATH INFINITI 5 FR IM (CATHETERS) ×3 IMPLANT
CATH OPTITORQUE TIG 4.0 5F (CATHETERS) ×3 IMPLANT
DEVICE RAD COMP TR BAND LRG (VASCULAR PRODUCTS) ×3 IMPLANT
GLIDESHEATH SLEND A-KIT 6F 22G (SHEATH) ×3 IMPLANT
KIT HEART LEFT (KITS) ×3 IMPLANT
PACK CARDIAC CATHETERIZATION (CUSTOM PROCEDURE TRAY) ×3 IMPLANT
TRANSDUCER W/STOPCOCK (MISCELLANEOUS) ×3 IMPLANT
TUBING CIL FLEX 10 FLL-RA (TUBING) ×3 IMPLANT
WIRE EMERALD 3MM-J .035X260CM (WIRE) ×3 IMPLANT

## 2016-09-08 NOTE — Interval H&P Note (Signed)
History and Physical Interval Note:  09/08/2016 9:03 AM  Aaron Santiago  has presented today for surgery, with the diagnosis of n stemi with known CAD.  The various methods of treatment have been discussed with the patient and family. After consideration of risks, benefits and other options for treatment, the patient has consented to  Procedure(s): Left Heart Cath and Cors/Grafts Angiography (N/A) with Possible Percutaneous Coronary Intervention as a surgical intervention .  The patient's history has been reviewed, patient examined, no change in status, stable for surgery.  I have reviewed the patient's chart and labs.  Questions were answered to the patient's satisfaction.     Cath Lab Visit (complete for each Cath Lab visit)  Clinical Evaluation Leading to the Procedure:   ACS: Yes.    Non-ACS:    Anginal Classification: CCS IV  Anti-ischemic medical therapy: Maximal Therapy (2 or more classes of medications)  Non-Invasive Test Results: No non-invasive testing performed  Prior CABG: Previous CABG   Bryan Lemma

## 2016-09-08 NOTE — Progress Notes (Signed)
PROGRESS NOTE    Aaron Santiago  RNH:[id number] DOB: [birth year] DOA: 09/03/2016 PCP: Josue Hector, MD     Brief Narrative: 70 yo male transfer from [facility] due to altered mental status and hyperglycemia plus ketoacidosis. At home noted by family to have decreased mental status, positive history of etho abuse and non compliant with diabetic regimen. Noted abnormal ekg (st depression and t wave inversion on antero-lateral leads) plus NSVT. Cardiology consulted and cardiac catheterization performed.    Assessment & Plan:   Active Problems:   DKA (diabetic ketoacidoses) (HCC)   AKI (acute kidney injury) (HCC)   Hypothermia   Severe dehydration   Elevated troponin   Acute encephalopathy   Abnormal EKG   Nonsustained ventricular tachycardia (HCC)   NSTEMI (non-ST elevated myocardial infarction) (HCC)   1. Hyperglycemia with DKA. Patient tolerating po well, will continue insulin sliding scale and basal insulin with levimir, serum glucose [phone]. Will stop dextrose.   2. CAD. Patient known to have cad s/p CABG, will continue antiplatelet therapy, coronary angiography report with patent graft and stents. Chest pain suspected from chest wall. Will continue pain control. Will continue coreg and statin therapy.   3. T2DM. Will continue glucose control and monitoring. Will stop IV dextrose and will decrease levimir to 8 units.   4. HTN. Continue blood pressure control with amlodipine and coreg.   5. Diastolic heart failure. Clinically euvolemic, follow on echocardiogram.   6. AKI. CR down to 1,45, will continue to follow on renal function and electrolytes, avoid hypotension or nephrotoxic agents, continue saline IV post contrast. Replete K and Mg, target K at 2 and Mg at 4.   7. Etho abuse. No signs of withdrawal, will continue thiamine, multivitamins. Will add lorazepam as needed. Continue citalopram.    DVT prophylaxis: heparin Code Status: full  Family Communication: no  family at the bedside Disposition Plan: home    Consultants:   Cardiology  Procedures: cardiac catheterization 10/10   Antimicrobials:     Subjective: Patient with positive chest wall pain on the right, moderate in intensity, worse with movement, no improving factors, no radiation. No nausea or vomiting, no dyspnea or angina.   Objective: Vitals:   09/08/16 1145 09/08/16 1200 09/08/16 1215 09/08/16 1230  BP: 127/80 123/73 116/68 110/69  Pulse: (!) 111 (!) 110 (!) 110 (!) 108  Resp: 12 11 11 11   Temp:      TempSrc:      SpO2: 93% 93% 93% 94%  Weight:      Height:        Intake/Output Summary (Last 24 hours) at 09/08/16 1341 Last data filed at 09/08/16 0800  Gross per 24 hour  Intake          1836.78 ml  Output              830 ml  Net          1006.78 ml   Filed Weights   09/03/16 1337 09/08/16 0000  Weight: 78 kg (171 lb 15.3 oz) 72.6 kg (160 lb 0.9 oz)    Examination:  General exam: deconditioned E ENT: no conjunctival pallor, oral mucosa moist.  Respiratory system: Clear to auscultation. Respiratory effort normal. Mild decreased breath sounds at bases with poor inspiratory effort.  Cardiovascular system: S1 & S2 heard, RRR. No JVD, murmurs, rubs, gallops or clicks. No pedal edema. Gastrointestinal system: Abdomen is nondistended, soft and nontender. No organomegaly or masses felt. Normal bowel sounds heard. Central nervous  system: Alert and oriented. No focal neurological deficits. Extremities: Symmetric 5 x 5 power. Skin: No rashes, lesions or ulcers  Data Reviewed: I have personally reviewed following labs and imaging studies  CBC:  Recent Labs Lab 09/03/16 0926 09/04/16 0524 09/05/16 0500 09/06/16 0427 09/08/16 0425  WBC 9.7 12.4* 9.2 7.0 4.2  NEUTROABS 8.5*  --   --   --  2.9  HGB 12.8* 11.8* 11.5* 10.3* 9.7*  HCT 37.5* 32.8* 32.8* 29.8* 28.5*  MCV 93.3 77.4* 78.1 78.8 81.2  PLT 245 236 191 154 197   Basic Metabolic Panel:  Recent  Labs Lab 09/03/16 0926  09/04/16 0524 09/04/16 2136 09/05/16 0500 09/07/16 0400 09/07/16 1131 09/08/16 0425  NA 117*  < > 138 132* 134* 139 139 139  K 4.5  < > 3.3* 3.2* 3.2* 3.0* 3.3* 3.6  CL 72*  < > 98* 99* 100* 99* 102 101  CO2 <7*  < > 27 25 26 29 30 28   GLUCOSE 1,352*  < > 46* 304* 220* 185* 121* 168*  BUN 103*  < > 84* 70* 62* 30* 28* 24*  CREATININE 4.82*  < > 3.78* 2.97* 2.62* 1.65* 1.59* 1.45*  CALCIUM 8.1*  < > 8.1* 7.6* 7.8* 7.9* 8.0* 7.9*  MG 2.8*  --  2.1  --  1.7  --  1.5* 1.9  PHOS  --   --  <1.0*  --  1.7*  --  2.9 3.6  < > = values in this interval not displayed. GFR: Estimated Creatinine Clearance: 49.4 mL/min (by C-G formula based on SCr of 1.45 mg/dL (H)). Liver Function Tests:  Recent Labs Lab 09/03/16 0926 09/05/16 0500 09/08/16 0425  AST 18  --   --   ALT 24  --   --   ALKPHOS 121  --   --   BILITOT 1.9*  --   --   PROT 5.1*  --   --   ALBUMIN 2.3* 1.7* 1.6*   No results for input(s): LIPASE, AMYLASE in the last 168 hours. No results for input(s): AMMONIA in the last 168 hours. Coagulation Profile:  Recent Labs Lab 09/08/16 0425  INR 0.95   Cardiac Enzymes:  Recent Labs Lab 09/04/16 0930 09/04/16 1638 09/07/16 1131 09/07/16 1828 09/07/16 2331  TROPONINI 0.99* 0.71* 2.82* 2.55* 1.91*   BNP (last 3 results) No results for input(s): PROBNP in the last 8760 hours. HbA1C: No results for input(s): HGBA1C in the last 72 hours. CBG:  Recent Labs Lab 09/08/16 0006 09/08/16 0403 09/08/16 0818 09/08/16 0959 09/08/16 1256  GLUCAP 140* 162* 164* 174* 197*   Lipid Profile: No results for input(s): CHOL, HDL, LDLCALC, TRIG, CHOLHDL, LDLDIRECT in the last 72 hours. Thyroid Function Tests: No results for input(s): TSH, T4TOTAL, FREET4, T3FREE, THYROIDAB in the last 72 hours. Anemia Panel: No results for input(s): VITAMINB12, FOLATE, FERRITIN, TIBC, IRON, RETICCTPCT in the last 72 hours. Sepsis Labs:  Recent Labs Lab 09/03/16 1610  09/03/16 1445 09/04/16 0930 09/05/16 0650  PROCALCITON  --   --   --  0.58  LATICACIDVEN 3.7* 2.9* 1.0  --     Recent Results (from the past 240 hour(s))  Blood culture (routine x 2)     Status: None   Collection Time: 09/03/16 10:54 AM  Result Value Ref Range Status   Specimen Description BLOOD LEFT FOREARM DRAWN BY RN  Final   Special Requests   Final    BOTTLES DRAWN AEROBIC AND ANAEROBIC AEB=6CC ANA=4CC  Culture NO GROWTH 5 DAYS  Final   Report Status 09/08/2016 FINAL  Final  Blood culture (routine x 2)     Status: None   Collection Time: 09/03/16 10:54 AM  Result Value Ref Range Status   Specimen Description BLOOD LEFT ANTECUBITAL DRAWN BY RN  Final   Special Requests BOTTLES DRAWN AEROBIC ONLY 5CC  Final   Culture NO GROWTH 5 DAYS  Final   Report Status 09/08/2016 FINAL  Final  MRSA PCR Screening     Status: None   Collection Time: 09/03/16  1:31 PM  Result Value Ref Range Status   MRSA by PCR NEGATIVE NEGATIVE Final    Comment:        The GeneXpert MRSA Assay (FDA approved for NASAL specimens only), is one component of a comprehensive MRSA colonization surveillance program. It is not intended to diagnose MRSA infection nor to guide or monitor treatment for MRSA infections.   Urine culture     Status: None   Collection Time: 09/03/16  2:48 PM  Result Value Ref Range Status   Specimen Description URINE, CATHETERIZED  Final   Special Requests NONE  Final   Culture NO GROWTH  Final   Report Status 09/04/2016 FINAL  Final         Radiology Studies: No results found.      Scheduled Meds: . amLODipine  5 mg Oral Daily  . aspirin  81 mg Oral Daily  . carvedilol  3.125 mg Oral BID WC  . chlorhexidine  15 mL Mouth Rinse BID  . escitalopram  20 mg Oral Daily  . folic acid  1 mg Oral Daily  . gabapentin  300 mg Oral TID  . heparin  5,000 Units Subcutaneous Q8H  . insulin aspart  0-9 Units Subcutaneous Q4H  . insulin detemir  10 Units Subcutaneous Q24H    . mouth rinse  15 mL Mouth Rinse q12n4p  . multivitamin with minerals  1 tablet Oral Daily  . pantoprazole  40 mg Oral Daily  . pravastatin  20 mg Oral q1800  . sodium chloride flush  3 mL Intravenous Q12H  . sucralfate  1 g Oral TID AC  . thiamine  100 mg Oral Daily   Or  . thiamine  100 mg Intravenous Daily   Continuous Infusions: . sodium chloride 3 mL/kg/hr (09/08/16 1141)  . dextrose 5 % and 0.45% NaCl 75 mL/hr at 09/08/16 0600     LOS: 5 days       Pharrell Ledford Annett Gulaaniel Arriah Wadle, MD [facility] Pager [phone]  If 7PM-7AM, please contact night-coverage [email or web address] Password [email or web address] 09/08/2016, 1:41 PM

## 2016-09-08 NOTE — Progress Notes (Signed)
PT Cancellation Note  Patient Details Name: Aaron Santiago MRN: [id number] DOB: [birth year]   Cancelled Treatment:    Reason Eval/Treat Not Completed: Patient at procedure or test/unavailable (pt at cardiac cath). Will continue attempts.   [name] 09/08/2016, 9:23 AM Skip Mayer PT [phone]

## 2016-09-08 NOTE — Progress Notes (Addendum)
Pt requested that Dr. Herbie Baltimore not speak with the family post procedure. Mr. [name] stated he would inform his family of the results.   Mr. [name] gave RN permission to discuss results of heart cath with family.   Aaron Santiago

## 2016-09-08 NOTE — Care Management Note (Signed)
Case Management Note  Patient Details  Name: Aaron Santiago MRN: [id number] Date of Birth: [birth year]  Subjective/Objective:    Pt admitted with altered mental status                Action/Plan:  PTA from home with sister.  ETOH  - CSW consulted for substance abuse.  CM will continue to follow for discharge needs   Expected Discharge Date:                  Expected Discharge Plan:     In-House Referral:  Clinical Social Work  Discharge planning Services     Post Acute Care Choice:    Choice offered to:     DME Arranged:    DME Agency:     HH Arranged:    HH Agency:     Status of Service:  In process, will continue to follow  If discussed at Long Length of Stay Meetings, dates discussed:    Additional Comments: 09/08/2016 Cath lab today, pending PT/OT eval.  CM will continue to follow for discharge needs Cherylann Parr, RN 09/08/2016, 1:57 PM

## 2016-09-08 NOTE — H&P (View-Only) (Signed)
Patient Name: Aaron Santiago Date of Encounter: 09/07/2016  Primary Cardiologist: Dr. Emi Holes Problem List     Active Problems:   DKA (diabetic ketoacidoses) (HCC)   AKI (acute kidney injury) (HCC)   Hypothermia   Severe dehydration   Elevated troponin   Acute encephalopathy    Subjective   Says he is having right sided chest pain and epigastric pain.   Inpatient Medications    Scheduled Meds: . amLODipine  5 mg Oral Daily  . aspirin  81 mg Oral Daily  . chlorhexidine  15 mL Mouth Rinse BID  . escitalopram  20 mg Oral Daily  . folic acid  1 mg Oral Daily  . gabapentin  300 mg Oral TID  . heparin subcutaneous  5,000 Units Subcutaneous Q8H  . insulin aspart  0-9 Units Subcutaneous Q4H  . [START ON 09/08/2016] insulin detemir  10 Units Subcutaneous Q24H  . mouth rinse  15 mL Mouth Rinse q12n4p  . metoprolol  2.5 mg Intravenous Q6H  . multivitamin with minerals  1 tablet Oral Daily  . pantoprazole  40 mg Oral Daily  . pravastatin  20 mg Oral q1800  . sucralfate  1 g Oral TID AC  . thiamine  100 mg Oral Daily   Or  . thiamine  100 mg Intravenous Daily   Continuous Infusions: . dextrose 5 % and 0.45% NaCl 75 mL/hr (09/06/16 2349)   PRN Meds:.LORazepam **OR** LORazepam, ondansetron (ZOFRAN) IV   Vital Signs    Vitals:   09/07/16 0900 09/07/16 1000 09/07/16 1100 09/07/16 1200  BP: 127/84 (!) 144/111 123/86 120/89  Pulse: (!) 35 (!) 130 (!) 121 (!) 118  Resp: (!) 27 16 (!) 45 15  Temp:    98.9 F (37.2 C)  TempSrc:    Oral  SpO2: (!) 86% 96% 93% 92%  Weight:      Height:        Intake/Output Summary (Last 24 hours) at 09/07/16 1254 Last data filed at 09/07/16 1100  Gross per 24 hour  Intake             1995 ml  Output             1415 ml  Net              580 ml   Filed Weights   09/03/16 1337  Weight: 171 lb 15.3 oz (78 kg)    Physical Exam    GEN: ill appearing male in no acute distress.  HEENT: Grossly normal, poor dentition Neck:  Supple, no JVD, carotid bruits, or masses. Cardiac: RRR, no murmurs, rubs, or gallops. No clubbing, cyanosis, edema.  Radials/DP/PT 2+ and equal bilaterally.  Respiratory:  Respirations regular and unlabored, clear to auscultation bilaterally. GI: Soft, nontender, nondistended, BS + x 4. MS: no deformity or atrophy. Skin: warm and dry, no rash. Neuro:  Strength and sensation are intact. Psych: AAOx3.  Lethargic  Labs    CBC  Recent Labs  09/05/16 0500 09/06/16 0427  WBC 9.2 7.0  HGB 11.5* 10.3*  HCT 32.8* 29.8*  MCV 78.1 78.8  PLT 191 154   Basic Metabolic Panel  Recent Labs  09/05/16 0500 09/07/16 0400 09/07/16 1131  NA 134* 139 139  K 3.2* 3.0* 3.3*  CL 100* 99* 102  CO2 26 29 30   GLUCOSE 220* 185* 121*  BUN 62* 30* 28*  CREATININE 2.62* 1.65* 1.59*  CALCIUM 7.8* 7.9* 8.0*  MG 1.7  --   --  PHOS 1.7*  --  2.9   Liver Function Tests  Recent Labs  09/05/16 0500  ALBUMIN 1.7*   Cardiac Enzymes  Recent Labs  09/04/16 1638  TROPONINI 0.71*     Telemetry    NSR, had run of NSVT- Personally Reviewed  ECG    NSR, deep t wave inversion in anterolateral leads, new when compared to old. - Personally Reviewed  Radiology    No results found.  Patient Profile     Mr. Aaron Santiago is a 70 year old male who presented with DKA and AMS. He has a long history of ETOH abuse and tobacco abuse. Cardiology was initially consulted for elevated troponin of 1.14 on 09/03/16 in the setting of DKA. Now with worsening EKG and NSVT.   Assessment & Plan    1. Abnormal EKG and chest pain: This am patient had chest pain and epigastric pain and a run of NSVT. EKG now with ST depression, and T wave inversion in anterolateral leads. He has a known history of CAD, with one vessel CABG from LIMA-LAD. There is some concern given his EKG changes for graft occlusion. However, patient is encephalopathic currently, and has acute renal failure.   He is lethargic on exam, says he is  having some right sided chest pain but it is unclear if he is describing epigastric pain. Troponin pending.   He will eventually need cath, but hesitant to do today given his finally improving renal status, although Scr is 1.59 and GFR is 43 today. Also, patient is lethargic, borderline obtunded.   Will start heparin gtt, follow troponin. Will also follow Echo results. Will start him back on his home Coreg at reduced dose - 3.125mg  BID. Will keep IV Metoprolol 2.5 mg prn  6 for HR>120.   2. History of CAD s/p CABG: See discussion above.   3. DKA: Management per primary team  Signed, Little IshikawaErin E Smith, NP  09/07/2016, 12:54 PM   I have seen, examined and evaluated the patient this PM along with Suzzette RighterErin Smith, NP.  After reviewing all the available data and chart, we discussed the patients laboratory, study & physical findings as well as symptoms in detail. I agree with her findings, examination as well as impression recommendations as per our discussion.    Patient with known CAD and had single-vessel CABG with LIMA to LAD and then PCI to the native arteries who was admitted for DKA him initially an elevated troponin level thought to be related to demand ischemia. However today he did have an episode is hard to tell as far symptom wise with his epigastric or chest discomfort, but associated with tach sinus tachycardia, new anterior lateral ST depressions. He also then had a prolonged run of nonsustained V. Tach.  At this point I think more concerned with acute coronary event then simply demand ischemia. Unfortunately, he still remains somewhat confused and disoriented, and his renal function is finally now getting back into the normal zone. I would very much prefer to not taken to the catheterization was his recurrent symptoms or arrhythmias.  He was cycling cardiac enzymes, would go ahead and start IV heparin infusion. He is oriented on Plavix. I will restart low-dose carvedilol and she was on higher  dose at home. We'll also keep IV Lopressor for when necessary tachycardia/hypertension.  Would cycle cardiac enzymes but if trend appears that this is a new acute coronary syndrome, think he probably would benefit from considering cardiac catheterization as early as tomorrow. With  these EKG changes on very much concerned about anterior ischemia.  Would make nothing by mouth after midnight.  Bryan Lemma, M.D., M.S. Interventional Cardiologist   Pager # [phone] Phone # [phone] [address] [place], [place] [address]

## 2016-09-08 NOTE — Progress Notes (Signed)
ANTICOAGULATION CONSULT NOTE - Initial Consult  Pharmacy Consult for Heparin drip  Indication: chest pain/ACS  No Known Allergies  Patient Measurements: Height: 5\' 10"  (177.8 cm) Weight: 160 lb 0.9 oz (72.6 kg) IBW/kg (Calculated) : 73 Heparin Dosing Weight: 78 kg  Vital Signs: Temp: 98.2 F (36.8 C) (10/10 0404) Temp Source: Oral (10/10 0404) BP: 128/82 (10/10 0700) Pulse Rate: 118 (10/10 0700)  Labs:  Recent Labs  09/06/16 0427 09/07/16 0400 09/07/16 1131 09/07/16 1828 09/07/16 1851 09/07/16 2331 09/08/16 0425  HGB 10.3*  --   --   --   --   --  9.7*  HCT 29.8*  --   --   --   --   --  28.5*  PLT 154  --   --   --   --   --  197  LABPROT  --   --   --   --   --   --  12.7  INR  --   --   --   --   --   --  0.95  HEPARINUNFRC  --   --   --   --  0.29*  --  0.39  CREATININE  --  1.65* 1.59*  --   --   --  1.45*  TROPONINI  --   --  2.82* 2.55*  --  1.91*  --     Estimated Creatinine Clearance: 49.4 mL/min (by C-G formula based on SCr of 1.45 mg/dL (H)).   Medical History: Past Medical History:  Diagnosis Date  . Arthritis   . CAD (coronary artery disease)    LIMA to LAD 2002, DES to circumflex and RCA 2012  . Carotid artery stenosis    Moderate bilateral disease  . CHF (congestive heart failure) (HCC)   . Essential hypertension   . GERD (gastroesophageal reflux disease)   . History of substance abuse    Alcohol and cocaine  . Hyperlipidemia   . Peptic ulcer disease   . Seasonal allergies   . Stomach ulcer 1972   Antrectomy  . Type 2 diabetes mellitus (HCC)     Medications:  Scheduled:  . amLODipine  5 mg Oral Daily  . aspirin  81 mg Oral Daily  . carvedilol  3.125 mg Oral BID WC  . chlorhexidine  15 mL Mouth Rinse BID  . escitalopram  20 mg Oral Daily  . folic acid  1 mg Oral Daily  . gabapentin  300 mg Oral TID  . insulin aspart  0-9 Units Subcutaneous Q4H  . insulin detemir  10 Units Subcutaneous Q24H  . mouth rinse  15 mL Mouth Rinse  q12n4p  . multivitamin with minerals  1 tablet Oral Daily  . pantoprazole  40 mg Oral Daily  . pravastatin  20 mg Oral q1800  . sodium chloride flush  3 mL Intravenous Q12H  . sucralfate  1 g Oral TID AC  . thiamine  100 mg Oral Daily   Or  . thiamine  100 mg Intravenous Daily    Assessment: 69 YOM continues on heparin for r/o ACS. Going for cath this am.   Heparin level therapeutic on 950 units/hr.  No bleeding noted.    Goal of Therapy:  Heparin level 0.3-0.7 units/ml Monitor platelets by anticoagulation protocol: Yes   Plan:  Continue heparin to 950 units/hr Follow-up post cath Daily heparin level while on therapy Monitor CBC, s/sx bleeding    Link Snuffer, PharmD, BCPS Clinical Pharmacist  [phone] 7:55 AM, 09/08/2016

## 2016-09-08 NOTE — Progress Notes (Signed)
[facility] ADULT ICU REPLACEMENT PROTOCOL FOR AM LAB REPLACEMENT ONLY  The patient does not apply for the [facility] Adult ICU Electrolyte Replacment Protocol based on the criteria listed below:    Is urine output >/= 0.5 ml/kg/hr for the last 6 hours? No. Patient's UOP is 0.4 ml/kg/hr  Abnormal electrolyte(s): K3.6  6. If a panic level lab has been reported, has the CCM MD in charge been notified? Yes.  .   Physician:  Laural Benes, MD  Melrose Nakayama 09/08/2016 6:31 AM

## 2016-09-09 ENCOUNTER — Other Ambulatory Visit (HOSPITAL_COMMUNITY): Payer: Self-pay

## 2016-09-09 ENCOUNTER — Inpatient Hospital Stay (HOSPITAL_COMMUNITY): Payer: Medicare Other

## 2016-09-09 DIAGNOSIS — I10 Essential (primary) hypertension: Secondary | ICD-10-CM

## 2016-09-09 DIAGNOSIS — R0781 Pleurodynia: Secondary | ICD-10-CM

## 2016-09-09 DIAGNOSIS — I5032 Chronic diastolic (congestive) heart failure: Secondary | ICD-10-CM

## 2016-09-09 DIAGNOSIS — E1311 Other specified diabetes mellitus with ketoacidosis with coma: Secondary | ICD-10-CM

## 2016-09-09 DIAGNOSIS — I219 Acute myocardial infarction, unspecified: Secondary | ICD-10-CM | POA: Diagnosis not present

## 2016-09-09 DIAGNOSIS — I214 Non-ST elevation (NSTEMI) myocardial infarction: Secondary | ICD-10-CM

## 2016-09-09 LAB — GLUCOSE, CAPILLARY
GLUCOSE-CAPILLARY: 128 mg/dL — AB (ref 65–99)
GLUCOSE-CAPILLARY: 81 mg/dL (ref 65–99)
GLUCOSE-CAPILLARY: 93 mg/dL (ref 65–99)
GLUCOSE-CAPILLARY: 94 mg/dL (ref 65–99)
Glucose-Capillary: 113 mg/dL — ABNORMAL HIGH (ref 65–99)
Glucose-Capillary: 142 mg/dL — ABNORMAL HIGH (ref 65–99)

## 2016-09-09 LAB — ALBUMIN: Albumin: 1.5 g/dL — ABNORMAL LOW (ref 3.5–5.0)

## 2016-09-09 LAB — CBC WITH DIFFERENTIAL/PLATELET
Basophils Absolute: 0 10*3/uL (ref 0.0–0.1)
Basophils Relative: 0 %
Eosinophils Absolute: 0.1 10*3/uL (ref 0.0–0.7)
Eosinophils Relative: 1 %
HEMATOCRIT: 28.5 % — AB (ref 39.0–52.0)
HEMOGLOBIN: 9.7 g/dL — AB (ref 13.0–17.0)
LYMPHS ABS: 0.9 10*3/uL (ref 0.7–4.0)
Lymphocytes Relative: 22 %
MCH: 27.4 pg (ref 26.0–34.0)
MCHC: 34 g/dL (ref 30.0–36.0)
MCV: 80.5 fL (ref 78.0–100.0)
MONOS PCT: 21 %
Monocytes Absolute: 0.8 10*3/uL (ref 0.1–1.0)
NEUTROS ABS: 2.2 10*3/uL (ref 1.7–7.7)
NEUTROS PCT: 56 %
Platelets: 273 10*3/uL (ref 150–400)
RBC: 3.54 MIL/uL — ABNORMAL LOW (ref 4.22–5.81)
RDW: 15.2 % (ref 11.5–15.5)
WBC: 3.9 10*3/uL — ABNORMAL LOW (ref 4.0–10.5)

## 2016-09-09 LAB — MAGNESIUM: Magnesium: 1.8 mg/dL (ref 1.7–2.4)

## 2016-09-09 LAB — BASIC METABOLIC PANEL
Anion gap: 7 (ref 5–15)
BUN: 24 mg/dL — AB (ref 6–20)
CHLORIDE: 103 mmol/L (ref 101–111)
CO2: 28 mmol/L (ref 22–32)
CREATININE: 1.35 mg/dL — AB (ref 0.61–1.24)
Calcium: 7.9 mg/dL — ABNORMAL LOW (ref 8.9–10.3)
GFR calc Af Amer: >60 mL/min (ref >60)
GFR calc non Af Amer: 52 mL/min — ABNORMAL LOW (ref >60)
GLUCOSE: 147 mg/dL — AB (ref 65–99)
Potassium: 3.4 mmol/L — ABNORMAL LOW (ref 3.5–5.1)
Sodium: 138 mmol/L (ref 135–145)

## 2016-09-09 LAB — PHOSPHORUS: PHOSPHORUS: 3.7 mg/dL (ref 2.5–4.6)

## 2016-09-09 MED ORDER — CARVEDILOL 6.25 MG PO TABS: 6.2500 mg | ORAL_TABLET | Freq: Two times a day (BID) | ORAL | Status: DC

## 2016-09-09 MED ORDER — CARVEDILOL 3.125 MG PO TABS
3.1250 mg | ORAL_TABLET | Freq: Two times a day (BID) | ORAL | Status: DC
  Administered 2016-09-10: 3.125 mg via ORAL
  Filled 2016-09-09 (×3): qty 1

## 2016-09-09 MED ORDER — CARVEDILOL 3.125 MG PO TABS
3.1250 mg | ORAL_TABLET | Freq: Once | ORAL | Status: DC
  Filled 2016-09-09: qty 1

## 2016-09-09 NOTE — Progress Notes (Signed)
Patient complains of right epigastric pain, relieved by warm beverages and morphine 2 mg. See MAR.

## 2016-09-09 NOTE — Care Management Note (Addendum)
Case Management Note  Patient Details  Name: Aaron Santiago MRN: [id number] Date of Birth: 02-16-1946  Subjective/Objective:    Pt admitted with altered mental status                Action/Plan:  PTA from home with sister.  ETOH  - CSW consulted for substance abuse.  CM will continue to follow for discharge needs   Expected Discharge Date:                  Expected Discharge Plan:     In-House Referral:  Clinical Social Work  Discharge planning Services     Post Acute Care Choice:    Choice offered to:     DME Arranged:    DME Agency:     HH Arranged:    HH Agency:     Status of Service:  In process, will continue to follow  If discussed at Long Length of Stay Meetings, dates discussed:    Additional Comments: 09/09/2016  PT has recommended SNF - CSW consulted.  CM contacted attending early am to request possible transfer modification from stepdown to tele or med surg  09/08/16 Cath lab today, pending PT/OT eval.  CM will continue to follow for discharge needs Cherylann Parr, RN 09/09/2016, 2:45 PM

## 2016-09-09 NOTE — Progress Notes (Signed)
PROGRESS NOTE    Aaron Santiago  ZOX:[id number] DOB: [birth year] DOA: 09/03/2016 PCP: Josue Hector, MD   Brief Narrative:   70 y.o. WM PMHx DM type 2 uncontrolled with complication, HLD, CAD native artery, Chronic Diastolic CHF, S/P CABG 2003, S/P PCI to the proximal RCA (Resolute DES 3.0 mm x 15 mm) and mid Circumflex (Promus DES 2.5 mm x 28 mm) in 2012, HTN, Substance abuse, EtOH abuse,  Has been non-compliant with meds (reportedly stopped taking 1 month ago).  He presented to [facility] ED on 10/5 via EMS due to AMS.  He apparently lives with his sister who was the one who noticed his decreased / worsening level of consciousness.  He apparently has heavy ETOH hx but stopped drinking 3 days prior to presentation.  In ED, labs were suggestive of DKA vs HONK.  He was felt to be too unstable; therefore, was transferred to [facility] ICU for further evaluation and management. On admission UDS negative, EtOH level negative   Subjective: 10/11 A/O 4, only complaint is right sided lateral rib cage pain. States does not recall having a fall or any other trauma.    Assessment & Plan:   Active Problems:   DKA (diabetic ketoacidoses) (HCC)   AKI (acute kidney injury) (HCC)   Hypothermia   Severe dehydration   Elevated troponin   Acute encephalopathy   Abnormal EKG   Nonsustained ventricular tachycardia (HCC)   Demand myocardial infarction   Rib pain   Diabetic ketoacidosis with coma associated with type 2 diabetes mellitus (HCC)   Chronic diastolic CHF (congestive heart failure) (HCC)   Essential hypertension   ETOH abuse   DKA.  -Resolved    CAD Native artery -Patient known to have cad s/p CABG -Aspirin 81 mg daily -Not on Plavix at admission  DM type II uncontrolled with complication  -10/5 Hemoglobin A1c = 13.1  -Levemir 8 units daily -Sensitive SSI    NSTEMI -Cardiology has evaluated and no occlusion responsible. Believes secondary to demand ischemia. See cardiology  note -  HTN.  -Amlodipine 5 mg daily -Coreg 3.125 mg  BID .   Chronic Diastolic heart failure.  -Per cardiology: Echocardiogram not completed, left heart cath completed. -Strict in and out since admission +5.0L -Daily weight Filed Weights   09/03/16 1337 09/08/16 0000 09/09/16 0400  Weight: 78 kg (171 lb 15.3 oz) 72.6 kg (160 lb 0.9 oz) 74.7 kg (164 lb 10.9 oz)   HLD -Pravastatin 20 mg daily  Acute renal failure (baseline Cr 0.9) Lab Results  Component Value Date   CREATININE 1.46 (H) 09/10/2016   CREATININE 1.35 (H) 09/09/2016   CREATININE 1.45 (H) 09/08/2016  -New baseline given patient's NSTEMI and contrast load?  ETOH Abuse. - No signs of withdrawal  -most likely, will continue thiamine, multivitamins. Will add lorazepam as needed. Continue citalopram.   Rib cage pain -Obtain PCXR: Pleural effusion with old rib fractures see results below    DVT prophylaxis: Heparin subcutaneous Code Status: Full Family Communication: None Disposition Plan: SNF   Consultants:  [facility]  Procedures/Significant Events:  10/10 left heart cath:-Prox LAD lesion, 80 %stenosed. Ost 1st Diag lesion, 80 %stenosed. -- Prior to LIMA insertion. This is stable from 2012 -Mid LAD-1 lesion, 100 %stenosed -LIMA-LAD and is large and anatomically normal. Distal to LIMA, Mid LAD-2 lesion, 45 %stenosed 10/11 PCXR:-Bilateral pleural effusions with left lower lobe collapse/consolidation. -Old bilateral rib fractures.    Cultures MRSA PCR 10/5:  Negative Blood Ctx x2 10/5 >>  Urine Ctx 10/5 >>  Antimicrobials: None   Devices    LINES / TUBES:  R Femoral CVL 10/5 >> Foley 10/6 >>    Continuous Infusions:    Objective: Vitals:   09/10/16 0830 09/10/16 0900 09/10/16 0933 09/10/16 1000  BP: 138/80 125/71 125/71 102/66  Pulse: (!) 121 (!) 117  (!) 114  Resp:  12  14  Temp:      TempSrc:      SpO2:  98%  99%  Weight:      Height:        Intake/Output Summary (Last 24  hours) at 09/10/16 1116 Last data filed at 09/10/16 1000  Gross per 24 hour  Intake             1200 ml  Output              675 ml  Net              525 ml   Filed Weights   09/03/16 1337 09/08/16 0000 09/09/16 0400  Weight: 78 kg (171 lb 15.3 oz) 72.6 kg (160 lb 0.9 oz) 74.7 kg (164 lb 10.9 oz)    Examination:  General: A/O 4, sleepy but arousable, No acute respiratory distress Eyes: negative scleral hemorrhage, negative anisocoria, negative icterus ENT: Negative Runny nose, negative gingival bleeding, Neck:  Negative scars, masses, torticollis, lymphadenopathy, JVD Lungs: Clear to auscultation bilaterally without wheezes or crackles Cardiovascular: Regular rate and rhythm without murmur gallop or rub normal S1 and S2, right lateral rib cage pain to palpation Abdomen: negative abdominal pain, nondistended, positive soft, bowel sounds, no rebound, no ascites, no appreciable mass Extremities: No significant cyanosis, clubbing, or edema bilateral lower extremities Skin: Negative rashes, lesions, ulcers Psychiatric:  Negative depression, negative anxiety, negative fatigue, negative mania  Central nervous system:  Cranial nerves II through XII intact, tongue/uvula midline, all extremities muscle strength 5/5, sensation intact throughout,, negative dysarthria, negative expressive aphasia, negative receptive aphasia.  .     Data Reviewed: Care during the described time interval was provided by me .  I have reviewed this patient's available data, including medical history, events of note, physical examination, and all test results as part of my evaluation. I have personally reviewed and interpreted all radiology studies.  CBC:  Recent Labs Lab 09/05/16 0500 09/06/16 0427 09/08/16 0425 09/09/16 0400 09/10/16 0345  WBC 9.2 7.0 4.2 3.9* 5.0  NEUTROABS  --   --  2.9 2.2 3.1  HGB 11.5* 10.3* 9.7* 9.7* 10.1*  HCT 32.8* 29.8* 28.5* 28.5* 29.7*  MCV 78.1 78.8 81.2 80.5 81.4  PLT 191  154 197 273 342   Basic Metabolic Panel:  Recent Labs Lab 09/05/16 0500 09/07/16 0400 09/07/16 1131 09/08/16 0425 09/09/16 0400 09/10/16 0345  NA 134* 139 139 139 138 138  K 3.2* 3.0* 3.3* 3.6 3.4* 3.9  CL 100* 99* 102 101 103 103  CO2 26 29 30 28 28 23   GLUCOSE 220* 185* 121* 168* 147* 154*  BUN 62* 30* 28* 24* 24* 26*  CREATININE 2.62* 1.65* 1.59* 1.45* 1.35* 1.46*  CALCIUM 7.8* 7.9* 8.0* 7.9* 7.9* 7.9*  MG 1.7  --  1.5* 1.9 1.8 1.5*  PHOS 1.7*  --  2.9 3.6 3.7 3.7   GFR: Estimated Creatinine Clearance: 49.3 mL/min (by C-G formula based on SCr of 1.46 mg/dL (H)). Liver Function Tests:  Recent Labs Lab 09/05/16 0500 09/08/16 0425 09/09/16 0400 09/10/16 0345  ALBUMIN 1.7* 1.6* 1.5* 1.5*  No results for input(s): LIPASE, AMYLASE in the last 168 hours. No results for input(s): AMMONIA in the last 168 hours. Coagulation Profile:  Recent Labs Lab 09/08/16 0425  INR 0.95   Cardiac Enzymes:  Recent Labs Lab 09/04/16 0930 09/04/16 1638 09/07/16 1131 09/07/16 1828 09/07/16 2331  TROPONINI 0.99* 0.71* 2.82* 2.55* 1.91*   BNP (last 3 results) No results for input(s): PROBNP in the last 8760 hours. HbA1C: No results for input(s): HGBA1C in the last 72 hours. CBG:  Recent Labs Lab 09/09/16 1527 09/09/16 1955 09/09/16 2335 09/10/16 0351 09/10/16 0759  GLUCAP 93 94 113* 145* 175*   Lipid Profile: No results for input(s): CHOL, HDL, LDLCALC, TRIG, CHOLHDL, LDLDIRECT in the last 72 hours. Thyroid Function Tests: No results for input(s): TSH, T4TOTAL, FREET4, T3FREE, THYROIDAB in the last 72 hours. Anemia Panel: No results for input(s): VITAMINB12, FOLATE, FERRITIN, TIBC, IRON, RETICCTPCT in the last 72 hours. Urine analysis:    Component Value Date/Time   COLORURINE YELLOW 09/03/2016 0853   APPEARANCEUR HAZY (A) 09/03/2016 0853   LABSPEC 1.015 09/03/2016 0853   PHURINE 5.0 09/03/2016 0853   GLUCOSEU >1000 (A) 09/03/2016 0853   HGBUR SMALL (A)  09/03/2016 0853   BILIRUBINUR SMALL (A) 09/03/2016 0853   KETONESUR TRACE (A) 09/03/2016 0853   PROTEINUR NEGATIVE 09/03/2016 0853   UROBILINOGEN 0.2 11/05/2011 0806   NITRITE NEGATIVE 09/03/2016 0853   LEUKOCYTESUR NEGATIVE 09/03/2016 0853   Sepsis Labs: @LABRCNTIP (procalcitonin:4,lacticidven:4)  ) Recent Results (from the past 240 hour(s))  Blood culture (routine x 2)     Status: None   Collection Time: 09/03/16 10:54 AM  Result Value Ref Range Status   Specimen Description BLOOD LEFT FOREARM DRAWN BY RN  Final   Special Requests   Final    BOTTLES DRAWN AEROBIC AND ANAEROBIC AEB=6CC ANA=4CC   Culture NO GROWTH 5 DAYS  Final   Report Status 09/08/2016 FINAL  Final  Blood culture (routine x 2)     Status: None   Collection Time: 09/03/16 10:54 AM  Result Value Ref Range Status   Specimen Description BLOOD LEFT ANTECUBITAL DRAWN BY RN  Final   Special Requests BOTTLES DRAWN AEROBIC ONLY 5CC  Final   Culture NO GROWTH 5 DAYS  Final   Report Status 09/08/2016 FINAL  Final  MRSA PCR Screening     Status: None   Collection Time: 09/03/16  1:31 PM  Result Value Ref Range Status   MRSA by PCR NEGATIVE NEGATIVE Final    Comment:        The GeneXpert MRSA Assay (FDA approved for NASAL specimens only), is one component of a comprehensive MRSA colonization surveillance program. It is not intended to diagnose MRSA infection nor to guide or monitor treatment for MRSA infections.   Urine culture     Status: None   Collection Time: 09/03/16  2:48 PM  Result Value Ref Range Status   Specimen Description URINE, CATHETERIZED  Final   Special Requests NONE  Final   Culture NO GROWTH  Final   Report Status 09/04/2016 FINAL  Final         Radiology Studies: Dg Chest Port 1 View  Result Date: 09/09/2016 CLINICAL DATA:  Right rib pain for months.  No injury. EXAM: PORTABLE CHEST 1 VIEW COMPARISON:  09/03/2016. FINDINGS: Trachea is midline. Heart size stable. Thoracic aorta is  calcified. There is left lower lobe collapse/ consolidation with bilateral pleural effusions. Old bilateral rib fractures. IMPRESSION: Bilateral pleural effusions with left  lower lobe collapse/ consolidation. Electronically Signed   By: Leanna Battles M.D.   On: 09/09/2016 17:29        Scheduled Meds: . amLODipine  5 mg Oral Daily  . aspirin  81 mg Oral Daily  . carvedilol  3.125 mg Oral BID WC  . escitalopram  20 mg Oral Daily  . folic acid  1 mg Oral Daily  . gabapentin  300 mg Oral TID  . heparin  5,000 Units Subcutaneous Q8H  . insulin aspart  0-9 Units Subcutaneous Q4H  . insulin detemir  8 Units Subcutaneous Q24H  . magnesium oxide  400 mg Oral BID  . multivitamin with minerals  1 tablet Oral Daily  . pantoprazole  40 mg Oral Daily  . pravastatin  20 mg Oral q1800  . sodium chloride flush  3 mL Intravenous Q12H  . sucralfate  1 g Oral TID AC  . thiamine  100 mg Oral Daily   Or  . thiamine  100 mg Intravenous Daily   Continuous Infusions:    LOS: 7 days    Time spent: 40 minutes    WOODS, Roselind Messier, MD [facility] Pager [phone]   If 7PM-7AM, please contact night-coverage [email or web address] Password [email or web address] 09/10/2016, 11:16 AM

## 2016-09-09 NOTE — Progress Notes (Addendum)
Patient Name: Aaron CrumblyJerry P Santiago Date of Encounter: 09/10/2016  Primary Cardiologist: Dr. Emi HolesArida  Hospital Problem List     Active Problems:   Demand myocardial infarction   DKA (diabetic ketoacidoses) (HCC)   AKI (acute kidney injury) (HCC)   Hypothermia   Severe dehydration   Elevated troponin   Acute encephalopathy   Abnormal EKG   Nonsustained ventricular tachycardia (HCC)   Rib pain   Diabetic ketoacidosis with coma associated with type 2 diabetes mellitus (HCC)   Chronic diastolic CHF (congestive heart failure) (HCC)   Essential hypertension   ETOH abuse    Subjective   Cardiac cath yesterday showed stable coronary disease. Suspect demand ischemia with stable proximal LAD-small diagonal disease with likely demand ischemia in the small diagonal to secretion that is not PCI target.  Now he clearly is indicating epigastric and right-sided right upper quadrant pain. No further anginal type pain. No point of dyspnea.  Inpatient Medications    Scheduled Meds: . albumin human  50 g Intravenous Once  . aspirin  81 mg Oral Daily  . carvedilol  6.25 mg Oral BID WC  . escitalopram  20 mg Oral Daily  . folic acid  1 mg Oral Daily  . furosemide  20 mg Intravenous Daily  . gabapentin  300 mg Oral TID  . heparin  5,000 Units Subcutaneous Q8H  . insulin aspart  0-9 Units Subcutaneous Q4H  . insulin detemir  8 Units Subcutaneous Q24H  . magnesium oxide  400 mg Oral BID  . multivitamin with minerals  1 tablet Oral Daily  . pantoprazole  40 mg Oral Daily  . pravastatin  20 mg Oral q1800  . sodium chloride flush  3 mL Intravenous Q12H  . sucralfate  1 g Oral TID AC  . thiamine  100 mg Oral Daily   Or  . thiamine  100 mg Intravenous Daily   Continuous Infusions:   PRN Meds:.sodium chloride, LORazepam, metoprolol, morphine injection, ondansetron (ZOFRAN) IV, oxyCODONE-acetaminophen, sodium chloride flush   Vital Signs    Vitals:   09/10/16 1215 09/10/16 1300 09/10/16 1400  09/10/16 1500  BP:  105/71 (!) 86/61 (!) 90/55  Pulse:  (!) 116 (!) 105 (!) 103  Resp:  (!) 25 10 (!) 9  Temp: 97.9 F (36.6 C)     TempSrc: Oral     SpO2:  100% 97% 96%  Weight:      Height:        Intake/Output Summary (Last 24 hours) at 09/10/16 1537 Last data filed at 09/10/16 1300  Gross per 24 hour  Intake             1020 ml  Output              675 ml  Net              345 ml   Filed Weights   09/03/16 1337 09/08/16 0000 09/09/16 0400  Weight: 78 kg (171 lb 15.3 oz) 72.6 kg (160 lb 0.9 oz) 74.7 kg (164 lb 10.9 oz)    Physical Exam    GEN: ill appearing male in no acute distress. Still grouchy HEENT: Grossly normal, poor dentition Neck: Supple, no JVD, carotid bruits, or masses. Cardiac: Tachycardic but regular rhythm with some ectopy, no murmurs, rubs, or gallops. No clubbing, cyanosis, edema.  Radials/DP/PT 2+ and equal bilaterally.  Respiratory:  Respirations regular and unlabored, clear to auscultation bilaterally. GI: Soft, nontender, epigastric and right upper quadrant  tenderness, BS + x 4. MS: no deformity or atrophy. Skin: warm and dry, no rash. Neuro:  Strength and sensation are intact. Psych: AAOx3.  Lethargic  Labs    CBC  Recent Labs  09/09/16 0400 09/10/16 0345  WBC 3.9* 5.0  NEUTROABS 2.2 3.1  HGB 9.7* 10.1*  HCT 28.5* 29.7*  MCV 80.5 81.4  PLT 273 342   Basic Metabolic Panel  Recent Labs  09/09/16 0400 09/10/16 0345  NA 138 138  K 3.4* 3.9  CL 103 103  CO2 28 23  GLUCOSE 147* 154*  BUN 24* 26*  CREATININE 1.35* 1.46*  CALCIUM 7.9* 7.9*  MG 1.8 1.5*  PHOS 3.7 3.7   Liver Function Tests  Recent Labs  09/09/16 0400 09/10/16 0345  ALBUMIN 1.5* 1.5*   Cardiac Enzymes  Recent Labs  09/07/16 1828 09/07/16 2331  TROPONINI 2.55* 1.91*     Telemetry    NSR, had run of NSVT- Personally Reviewed  ECG    NSR, deep t wave inversion in anterolateral leads, new when compared to old. - Personally Reviewed  Cardiology      CARDIAC CATH 09/08/2016  Prox LAD lesion, 80 %stenosed. Ost 1st Diag lesion, 80 %stenosed. -- Prior to LIMA insertion. This is stable from 2012  Mid LAD-1 lesion, 100 %stenosed.  LIMA-LAD and is large and anatomically normal. Distal to LIMA, Mid LAD-2 lesion, 45 %stenosed.  Prox RCA DES stent - 0 %stenosed.  Prox Cx to Mid Cx DES stent, 0 %stenosed.  LV end diastolic pressure is mildly elevated.  There is no aortic valve stenosis.   No angiographic evidence of a new lesion to explain non-ST elevation MI. I suspect that the elevation in troponin is related to demand ischemia with sinus tachycardia and ongoing illness.  Diagnostic Diagram     Patient Profile     [name] is a 70 year old male who presented with DKA and AMS. He has a long history of ETOH abuse and tobacco abuse. Cardiology was initially consulted for elevated troponin of 1.14 on 09/03/16 in the setting of DKA. Now with worsening EKG and NSVT.   Assessment & Plan    1. Abnormal EKG and chest pain = Demand Infarction: With positive troponin, and brief run of NSVT, there was concern for possible true ACS/type I MI/non-STEMI. As result he was taken to the cardiac catheterization lab but no new lesion found.   Troponin elevation is then likely related to Demand Infarction with no culprit lesion.  Continue treatment with beta blocker. Unable to titrate up today as his second blood pressure reading this morning was in the 90s. -- Keep when necessary metoprolol for rapid heart rate -- I suspect the rapid heart rate related to his existing LAD diagonal disease potentially is the culprit for his demand infarction.  Renal function is stable  Would preferentially reduce amlodipine dose in order to allow for carvedilol titration if his pressures continue to be low. Will also follow Echo results.  2. History of CAD s/p CABG: Patent stents and patent LIMA-LAD. Moderate disease in the LAD and distal RCA.  On aspirin,  statin and beta blocker  3. DKA: Management per primary team  Signed,  Bryan Lemma, MD, MS Interventional Cardiologist   Pager # [phone] Phone # [phone] [address] [place], [place] [address]  09/09/2016 12:40 PM

## 2016-09-09 NOTE — Evaluation (Signed)
Physical Therapy Evaluation Patient Details Name: Aaron CrumblyJerry P Oland MRN: [id number] DOB: May 02, 1946 Today's Date: 09/09/2016   History of Present Illness  Pt adm with DKA. Had some chest pain and underwent cardiac cath on 10/10. PMH -DM, CAD, ETOH,, chf  Clinical Impression  Pt admitted with above diagnosis and presents to PT with functional limitations due to deficits listed below (See PT problem list). Pt needs skilled PT to maximize independence and safety to allow discharge to ST-SNF unless pt progresses very quickly and can return home alone.      Follow Up Recommendations SNF    Equipment Recommendations  Other (comment) (To be assessed)    Recommendations for Other Services OT consult     Precautions / Restrictions Precautions Precautions: Fall Restrictions Weight Bearing Restrictions: No      Mobility  Bed Mobility Overal bed mobility: Needs Assistance Bed Mobility: Supine to Sit     Supine to sit: Mod assist     General bed mobility comments: Assist to elevate trunk into sitting  Transfers Overall transfer level: Needs assistance Equipment used: 1 person hand held assist Transfers: Sit to/from Stand;Stand Pivot Transfers Sit to Stand: Min assist Stand pivot transfers: Min assist       General transfer comment: Assist to bring hips up and for balance with pivot. Pt with knees, hips and trunk flexed throughout  Ambulation/Gait                Stairs            Wheelchair Mobility    Modified Rankin (Stroke Patients Only)       Balance Overall balance assessment: Needs assistance Sitting-balance support: No upper extremity supported;Feet supported Sitting balance-Leahy Scale: Fair     Standing balance support: Single extremity supported Standing balance-Leahy Scale: Poor                               Pertinent Vitals/Pain Pain Assessment: 0-10 Pain Score: 8  Pain Descriptors / Indicators: Throbbing Pain  Intervention(s): Limited activity within patient's tolerance;Monitored during session;Patient requesting pain meds-RN notified;Repositioned    Home Living Family/patient expects to be discharged to:: Private residence Living Arrangements: Alone   Type of Home: House       Home Layout: One level Home Equipment: None      Prior Function Level of Independence: Independent               Hand Dominance        Extremity/Trunk Assessment   Upper Extremity Assessment: Defer to OT evaluation           Lower Extremity Assessment: Generalized weakness         Communication   Communication: No difficulties  Cognition Arousal/Alertness: Awake/alert Behavior During Therapy: WFL for tasks assessed/performed Overall Cognitive Status: Impaired/Different from baseline Area of Impairment: Orientation;Problem solving Orientation Level: Disoriented to;Place;Time           Problem Solving: Slow processing;Requires verbal cues      General Comments      Exercises     Assessment/Plan    PT Assessment Patient needs continued PT services  PT Problem List Decreased strength;Decreased activity tolerance;Decreased balance;Decreased mobility;Pain          PT Treatment Interventions DME instruction;Gait training;Functional mobility training;Therapeutic activities;Therapeutic exercise;Balance training;Patient/family education    PT Goals (Current goals can be found in the Care Plan section)  Acute Rehab PT Goals Patient Stated  Goal: go home PT Goal Formulation: With patient Time For Goal Achievement: 09/23/16 Potential to Achieve Goals: Good    Frequency Min 3X/week   Barriers to discharge Decreased caregiver support lives alone    Co-evaluation               End of Session   Activity Tolerance: Patient limited by fatigue;Patient limited by pain Patient left: in chair;with call bell/phone within reach;with chair alarm set Nurse Communication: Mobility  status (Left O2 off)         Time: 4315-4008 PT Time Calculation (min) (ACUTE ONLY): 28 min   Charges:   PT Evaluation $PT Eval Moderate Complexity: 1 Procedure     PT G Codes:        Treavon Castilleja 2016-09-17, 1:11 PM [employer] PT [phone]

## 2016-09-10 ENCOUNTER — Inpatient Hospital Stay (HOSPITAL_COMMUNITY): Payer: Medicare Other

## 2016-09-10 DIAGNOSIS — R0781 Pleurodynia: Secondary | ICD-10-CM

## 2016-09-10 DIAGNOSIS — N179 Acute kidney failure, unspecified: Secondary | ICD-10-CM

## 2016-09-10 DIAGNOSIS — F1010 Alcohol abuse, uncomplicated: Secondary | ICD-10-CM

## 2016-09-10 DIAGNOSIS — Z794 Long term (current) use of insulin: Secondary | ICD-10-CM

## 2016-09-10 DIAGNOSIS — E783 Hyperchylomicronemia: Secondary | ICD-10-CM

## 2016-09-10 DIAGNOSIS — I10 Essential (primary) hypertension: Secondary | ICD-10-CM

## 2016-09-10 DIAGNOSIS — I5032 Chronic diastolic (congestive) heart failure: Secondary | ICD-10-CM

## 2016-09-10 DIAGNOSIS — E1111 Type 2 diabetes mellitus with ketoacidosis with coma: Secondary | ICD-10-CM

## 2016-09-10 DIAGNOSIS — R079 Chest pain, unspecified: Secondary | ICD-10-CM

## 2016-09-10 DIAGNOSIS — J90 Pleural effusion, not elsewhere classified: Secondary | ICD-10-CM

## 2016-09-10 LAB — CBC WITH DIFFERENTIAL/PLATELET
BASOS PCT: 0 %
Basophils Absolute: 0 10*3/uL (ref 0.0–0.1)
EOS ABS: 0.1 10*3/uL (ref 0.0–0.7)
Eosinophils Relative: 1 %
HEMATOCRIT: 29.7 % — AB (ref 39.0–52.0)
HEMOGLOBIN: 10.1 g/dL — AB (ref 13.0–17.0)
Lymphocytes Relative: 18 %
Lymphs Abs: 0.9 10*3/uL (ref 0.7–4.0)
MCH: 27.7 pg (ref 26.0–34.0)
MCHC: 34 g/dL (ref 30.0–36.0)
MCV: 81.4 fL (ref 78.0–100.0)
Monocytes Absolute: 0.9 10*3/uL (ref 0.1–1.0)
Monocytes Relative: 19 %
NEUTROS ABS: 3.1 10*3/uL (ref 1.7–7.7)
NEUTROS PCT: 62 %
Platelets: 342 10*3/uL (ref 150–400)
RBC: 3.65 MIL/uL — AB (ref 4.22–5.81)
RDW: 15.4 % (ref 11.5–15.5)
WBC: 5 10*3/uL (ref 4.0–10.5)

## 2016-09-10 LAB — ECHOCARDIOGRAM COMPLETE
FS: 13 % — AB (ref 28–44)
Height: 70 in
IV/PV OW: 1.1
LA ID, A-P, ES: 30 mm
LA diam end sys: 30 mm
LA diam index: 1.56 cm/m2
LA vol A4C: 41.6 ml
LA vol: 44.4 mL
LAVOLIN: 23.1 mL/m2
LDCA: 3.8 cm2
LV TDI E'LATERAL: 7.83
LV dias vol index: 51 mL/m2
LV dias vol: 98 mL (ref 62–150)
LV e' LATERAL: 7.83 cm/s
LVOTD: 22 mm
LVSYSVOL: 61 mL (ref 21–61)
LVSYSVOLIN: 32 mL/m2
PW: 9.83 mm — AB (ref 0.6–1.1)
RV LATERAL S' VELOCITY: 8.7 cm/s
RV sys press: 11 mmHg
Reg peak vel: 142 cm/s
Simpson's disk: 38
Stroke v: 37 ml
TAPSE: 15.5 mm
TR max vel: 142 cm/s
Weight: 2634.94 oz

## 2016-09-10 LAB — RENAL FUNCTION PANEL
ALBUMIN: 1.5 g/dL — AB (ref 3.5–5.0)
ANION GAP: 12 (ref 5–15)
BUN: 26 mg/dL — ABNORMAL HIGH (ref 6–20)
CALCIUM: 7.9 mg/dL — AB (ref 8.9–10.3)
CO2: 23 mmol/L (ref 22–32)
Chloride: 103 mmol/L (ref 101–111)
Creatinine, Ser: 1.46 mg/dL — ABNORMAL HIGH (ref 0.61–1.24)
GFR calc non Af Amer: 47 mL/min — ABNORMAL LOW (ref >60)
GFR, EST AFRICAN AMERICAN: 55 mL/min — AB (ref >60)
GLUCOSE: 154 mg/dL — AB (ref 65–99)
PHOSPHORUS: 3.7 mg/dL (ref 2.5–4.6)
POTASSIUM: 3.9 mmol/L (ref 3.5–5.1)
SODIUM: 138 mmol/L (ref 135–145)

## 2016-09-10 LAB — GLUCOSE, CAPILLARY
GLUCOSE-CAPILLARY: 145 mg/dL — AB (ref 65–99)
GLUCOSE-CAPILLARY: 175 mg/dL — AB (ref 65–99)
Glucose-Capillary: 145 mg/dL — ABNORMAL HIGH (ref 65–99)
Glucose-Capillary: 113 mg/dL — ABNORMAL HIGH (ref 65–99)

## 2016-09-10 LAB — MAGNESIUM: Magnesium: 1.5 mg/dL — ABNORMAL LOW (ref 1.7–2.4)

## 2016-09-10 MED ORDER — HEPARIN (PORCINE) IN NACL 100-0.45 UNIT/ML-% IJ SOLN
1100.0000 [IU]/h | INTRAMUSCULAR | Status: DC
  Administered 2016-09-10 – 2016-09-12 (×3): 1000 [IU]/h via INTRAVENOUS
  Filled 2016-09-10 (×4): qty 250

## 2016-09-10 MED ORDER — ALBUMIN HUMAN 25 % IV SOLN
50.0000 g | Freq: Once | INTRAVENOUS | Status: AC
Start: 2016-09-10 — End: 2016-09-10
  Administered 2016-09-10: 50 g via INTRAVENOUS
  Filled 2016-09-10: qty 50

## 2016-09-10 MED ORDER — CARVEDILOL 6.25 MG PO TABS
6.2500 mg | ORAL_TABLET | Freq: Two times a day (BID) | ORAL | Status: DC
  Administered 2016-09-10 – 2016-09-14 (×9): 6.25 mg via ORAL
  Filled 2016-09-10 (×9): qty 1

## 2016-09-10 MED ORDER — MAGNESIUM SULFATE 2 GM/50ML IV SOLN
2.0000 g | Freq: Once | INTRAVENOUS | Status: AC
  Administered 2016-09-10: 2 g via INTRAVENOUS
  Filled 2016-09-10: qty 50

## 2016-09-10 MED ORDER — SODIUM CHLORIDE 0.9% FLUSH
10.0000 mL | INTRAVENOUS | Status: DC | PRN
Start: 2016-09-10 — End: 2016-09-14
  Administered 2016-09-11 (×2): 10 mL
  Administered 2016-09-12: 20 mL
  Filled 2016-09-10 (×3): qty 40

## 2016-09-10 MED ORDER — AMLODIPINE BESYLATE 2.5 MG PO TABS: 2.5000 mg | ORAL_TABLET | Freq: Every day | ORAL | Status: DC

## 2016-09-10 MED ORDER — FUROSEMIDE 10 MG/ML IJ SOLN
20.0000 mg | Freq: Every day | INTRAMUSCULAR | Status: DC
  Administered 2016-09-10 – 2016-09-11 (×2): 20 mg via INTRAVENOUS
  Filled 2016-09-10 (×2): qty 2

## 2016-09-10 MED ORDER — HEPARIN BOLUS VIA INFUSION
2500.0000 [IU] | Freq: Once | INTRAVENOUS | Status: AC
  Administered 2016-09-10: 2500 [IU] via INTRAVENOUS
  Filled 2016-09-10: qty 2500

## 2016-09-10 NOTE — Progress Notes (Signed)
    Active Problems:   Demand myocardial infarction   DKA (diabetic ketoacidoses) (HCC)   AKI (acute kidney injury) (HCC)   Hypothermia   Severe dehydration   Elevated troponin   Acute encephalopathy   Abnormal EKG   Nonsustained ventricular tachycardia (HCC)   Rib pain   Diabetic ketoacidosis with coma associated with type 2 diabetes mellitus (HCC)   Chronic diastolic CHF (congestive heart failure) (HCC)   Essential hypertension   ETOH abuse  Echo pending   Seems to be relatively stable - c/o RUQ-rib pain.  No further CP or arrhythmias.  Net +++ & remains tachycardic.  Agree with diuresis. Converted Amlodipine to 2.5 mg in order to allow increasing Coreg to 6.25. Continue PRN IV BB.   Will see tomorrow with full note - hopefully with Echo data.  Bryan Lemma, MD

## 2016-09-10 NOTE — Progress Notes (Signed)
ANTICOAGULATION CONSULT NOTE - Initial Consult  Pharmacy Consult for Heparin Indication:  LV thrombus  No Known Allergies  Patient Measurements: Height: 6' (182.9 cm) Weight: 158 lb (71.7 kg) IBW/kg (Calculated) : 77.6   Vital Signs: Temp: 98 F (36.7 C) (10/12 1735) Temp Source: Oral (10/12 1735) BP: 127/64 (10/12 1735) Pulse Rate: 101 (10/12 1735)  Labs:  Recent Labs  09/07/16 2331  09/08/16 0425 09/09/16 0400 09/10/16 0345  HGB  --   < > 9.7* 9.7* 10.1*  HCT  --   --  28.5* 28.5* 29.7*  PLT  --   --  197 273 342  LABPROT  --   --  12.7  --   --   INR  --   --  0.95  --   --   HEPARINUNFRC  --   --  0.39  --   --   CREATININE  --   --  1.45* 1.35* 1.46*  TROPONINI 1.91*  --   --   --   --   < > = values in this interval not displayed.  Estimated Creatinine Clearance: 48.4 mL/min (by C-G formula based on SCr of 1.46 mg/dL (H)).   Medical History: Past Medical History:  Diagnosis Date  . Arthritis   . CAD (coronary artery disease)    LIMA to LAD 2002, DES to circumflex and RCA 2012  . Carotid artery stenosis    Moderate bilateral disease  . CHF (congestive heart failure) (HCC)   . Essential hypertension   . GERD (gastroesophageal reflux disease)   . History of substance abuse    Alcohol and cocaine  . Hyperlipidemia   . Peptic ulcer disease   . Seasonal allergies   . Stomach ulcer 1972   Antrectomy  . Type 2 diabetes mellitus [facility])     Assessment: 70 year old male beginning heparin for LV thrombus Sq heparin given at 1400 pm  Goal of Therapy:  Heparin level 0.3-0.7 units/ml Monitor platelets by anticoagulation protocol: Yes   Plan:  Heparin 2500 units iv bolus x 1 Heparin at 1000 units / hr AM labs  Thank you Okey Regal, PharmD [phone]  09/10/2016,7:29 PM

## 2016-09-10 NOTE — Progress Notes (Signed)
Contacted by Dr Jens Som tonight who was reading echos. 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 The pt told me he has never had ulcers though PUD shows up in his history. No endoscopy notes or GI notes in EPIC. His Hgb has been low but stable. A guaiac done in Dec 2016 was negative and the pt denies any melana. Will start Heparin and order an echo with contrast for tomorrow.   Corine Shelter PA-C 09/10/2016 6:58 PM

## 2016-09-10 NOTE — Care Management Important Message (Signed)
Important Message  Patient Details  Name: Aaron Santiago MRN: [id number] Date of Birth: October 03, 1946   Medicare Important Message Given:  Other (see comment)    Tanise Russman Abena 09/10/2016, 9:25 AM

## 2016-09-10 NOTE — NC FL2 (Signed)
MEDICAID FL2 LEVEL OF CARE SCREENING TOOL     IDENTIFICATION  Patient Name: Aaron CrumblyJerry P Gatt Birthdate: 1946-03-12 Sex: male Admission Date (Current Location): 09/03/2016  [facility] [place]   Facility and Address:  [facility], [address], [place], [place] [address]      Provider Number: [id number]  Attending Physician Name and Address:  Drema Dallasurtis J Woods, MD  Relative Name and Phone Number:       Current Level of Care: Hospital Recommended Level of Care: Skilled Nursing Facility Prior Approval Number:    Date Approved/Denied:   PASRR Number: [id number]  Discharge Plan: SNF    Current Diagnoses: Patient Active Problem List   Diagnosis Date Noted  . Demand myocardial infarction 09/09/2016  . Abnormal EKG   . Nonsustained ventricular tachycardia (HCC)   . DKA (diabetic ketoacidoses) (HCC) 09/03/2016  . AKI (acute kidney injury) (HCC)   . Hypothermia   . Severe dehydration   . Elevated troponin   . Acute encephalopathy   . Chest pain 12/02/2015  . Substance induced mood disorder (HCC) 11/25/2015  . Alcohol intoxication (HCC)   . PAD (peripheral artery disease) (HCC) 02/08/2012  . High cholesterol   . Hypertension   . 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 Type 2 diabetes mellitus with ketoacidosis and coma, with long-term current use of insulin (HCC) 11/05/2011  . Coronary artery disease involving native coronary artery of native heart with unstable angina pectoris (HCC) 11/05/2011  . Tobacco abuse 11/05/2011  . Chest pain 11/05/2011  . Carotid artery stenosis 10/31/2011    Orientation RESPIRATION BLADDER Height & Weight     Self  Normal Continent Weight: 164 lb 10.9 oz (74.7 kg) Height:  5\' 10"  (177.8 cm)  BEHAVIORAL SYMPTOMS/MOOD NEUROLOGICAL BOWEL NUTRITION STATUS      Continent Diet (Heart Healthy, Thin Liquids)  AMBULATORY STATUS COMMUNICATION OF  NEEDS Skin   Limited Assist Verbally Normal                       Personal Care Assistance Level of Assistance  Bathing, Feeding, Dressing Bathing Assistance: Limited assistance Feeding assistance: Limited assistance Dressing Assistance: Limited assistance     Functional Limitations Info  Sight, Hearing, Speech Sight Info: Adequate Hearing Info: Adequate Speech Info: Adequate    SPECIAL CARE FACTORS FREQUENCY  PT (By licensed PT)     PT Frequency: 5              Contractures Contractures Info: Not present    Additional Factors Info  Code Status, Allergies, Psychotropic, Insulin Sliding Scale Code Status Info: Full Code Allergies Info: No known allergies Psychotropic Info: Medications Insulin Sliding Scale Info: 4x/day       Current Medications (09/10/2016):  This is the current hospital active medication list Current Facility-Administered Medications  Medication Dose Route Frequency Provider Last Rate Last Dose  . 0.9 %  sodium chloride infusion  250 mL Intravenous PRN Marykay Lexavid W Harding, MD      . amLODipine (NORVASC) tablet 5 mg  5 mg Oral Daily Roslynn AmbleJennings E Nestor, MD   5 mg at 09/10/16 0933  . aspirin chewable tablet 81 mg  81 mg Oral Daily Leslye Peerobert S Byrum, MD   81 mg at 09/10/16 0930  . carvedilol (COREG) tablet 3.125 mg  3.125 mg Oral BID WC Marykay Lexavid W Harding, MD   3.125 mg at 09/10/16 0830  .  escitalopram (LEXAPRO) tablet 20 mg  20 mg Oral Daily Roslynn Amble, MD   20 mg at 09/10/16 0932  . folic acid (FOLVITE) tablet 1 mg  1 mg Oral Daily Casey Burkitt, MD   1 mg at 09/10/16 0931  . gabapentin (NEURONTIN) capsule 300 mg  300 mg Oral TID Roslynn Amble, MD   300 mg at 09/10/16 0932  . heparin injection 5,000 Units  5,000 Units Subcutaneous Q8H Marykay Lex, MD   5,000 Units at 09/10/16 0533  . insulin aspart (novoLOG) injection 0-9 Units  0-9 Units Subcutaneous Q4H Roslynn Amble, MD   2 Units at 09/10/16 0800  . insulin detemir (LEVEMIR)  injection 8 Units  8 Units Subcutaneous Q24H Coralie Keens, MD   8 Units at 09/10/16 0533  . LORazepam (ATIVAN) tablet 1 mg  1 mg Oral Q4H PRN Coralie Keens, MD   1 mg at 09/09/16 2308  . magnesium oxide (MAG-OX) tablet 400 mg  400 mg Oral BID Coralie Keens, MD   400 mg at 09/10/16 0933  . metoprolol (LOPRESSOR) injection 2.5 mg  2.5 mg Intravenous Q6H PRN Little Ishikawa, NP   2.5 mg at 09/09/16 0934  . morphine 2 MG/ML injection 2 mg  2 mg Intravenous Q3H PRN Leanne Chang, NP   2 mg at 09/10/16 0342  . multivitamin with minerals tablet 1 tablet  1 tablet Oral Daily Casey Burkitt, MD   1 tablet at 09/10/16 0931  . ondansetron (ZOFRAN) injection 4 mg  4 mg Intravenous Q6H PRN Oretha Milch, MD   4 mg at 09/07/16 0348  . oxyCODONE-acetaminophen (PERCOCET/ROXICET) 5-325 MG per tablet 1 tablet  1 tablet Oral Q4H PRN Coralie Keens, MD   1 tablet at 09/10/16 0830  . pantoprazole (PROTONIX) EC tablet 40 mg  40 mg Oral Daily Roslynn Amble, MD   40 mg at 09/09/16 9381  . pravastatin (PRAVACHOL) tablet 20 mg  20 mg Oral q1800 Roslynn Amble, MD   20 mg at 09/09/16 1556  . sodium chloride flush (NS) 0.9 % injection 3 mL  3 mL Intravenous Q12H Marykay Lex, MD   3 mL at 09/10/16 1000  . sodium chloride flush (NS) 0.9 % injection 3 mL  3 mL Intravenous PRN Marykay Lex, MD      . sucralfate (CARAFATE) 1 GM/10ML suspension 1 g  1 g Oral TID AC Roslynn Amble, MD   1 g at 09/10/16 0830  . thiamine (VITAMIN B-1) tablet 100 mg  100 mg Oral Daily Casey Burkitt, MD   100 mg at 09/10/16 8299   Or  . thiamine (B-1) injection 100 mg  100 mg Intravenous Daily Casey Burkitt, MD   100 mg at 09/05/16 1055     Discharge Medications: Please see discharge summary for a list of discharge medications.  Relevant Imaging Results:  Relevant Lab Results:   Additional Information SSN:  [id number]  Dede Query, LCSW

## 2016-09-10 NOTE — Care Management Note (Addendum)
Case Management Note  Patient Details  Name: Aaron Santiago MRN: [id number] Date of Birth: 25-Oct-1946  Subjective/Objective:    Pt admitted with altered mental status                Action/Plan:  PTA from home with sister.  ETOH  - CSW consulted for substance abuse.  CM will continue to follow for discharge needs   Expected Discharge Date:                  Expected Discharge Plan:     In-House Referral:  Clinical Social Work  Discharge planning Services     Post Acute Care Choice:    Choice offered to:     DME Arranged:    DME Agency:     HH Arranged:    HH Agency:     Status of Service:  In process, will continue to follow  If discussed at Long Length of Stay Meetings, dates discussed:    Additional Comments: 09/10/2016  Attending contacted CM; pt now has new pleural effusions and is 5 liters above baseline - interventions not yet determined.  Pt will not discharge today and SD order may be placed on hold.  CSW informed  CM texted paged MD to determine discharge to SNF timeline.  CSW informed possible pending discharge  09/09/16 PT has recommended SNF - CSW consulted.  CM contacted attending early am to request possible transfer modification from stepdown to tele or med surg  09/08/16 Cath lab today, pending PT/OT eval.  CM will continue to follow for discharge needs Cherylann Parr, RN 09/10/2016, 9:27 AM

## 2016-09-10 NOTE — Progress Notes (Signed)
PROGRESS NOTE    Aaron Santiago  ZOX:[id number] DOB: [birth year] DOA: 09/03/2016 PCP: Josue HectorNYLAND,LEONARD ROBERT, MD   Brief Narrative:   [age] y.o. WM PMHx DM type 2 uncontrolled with complication, HLD, CAD native artery, Chronic Diastolic CHF, S/P CABG 2003, S/P PCI to the proximal RCA (Resolute DES 3.0 mm x 15 mm) and mid Circumflex (Promus DES 2.5 mm x 28 mm) in 2012, HTN, Substance abuse, EtOH abuse,  Has been non-compliant with meds (reportedly stopped taking 1 month ago).  [facility] due to AMS.  He apparently lives with his sister who was the one who noticed his decreased / worsening level of consciousness.  He apparently has heavy ETOH hx but stopped drinking 3 days prior to presentation.  In ED, labs were suggestive of DKA vs HONK.  He was felt to be too unstable; therefore, was [facility] for further evaluation and management. On admission UDS negative, EtOH level negative   Subjective: 10/12 A/O 4, only complaint is right sided lateral rib cage pain. States does not recall having a fall or any other trauma.    Assessment & Plan:   Active Problems:   DKA (diabetic ketoacidoses) (HCC)   AKI (acute kidney injury) (HCC)   Hypothermia   Severe dehydration   Elevated troponin   Acute encephalopathy   Abnormal EKG   Nonsustained ventricular tachycardia (HCC)   Demand myocardial infarction   Rib pain   Diabetic ketoacidosis with coma associated with type 2 diabetes mellitus (HCC)   Chronic diastolic CHF (congestive heart failure) (HCC)   Essential hypertension   ETOH abuse   Pleural effusion   Hyperchylomicronemia   Acute renal failure (HCC)   DKA.  -Resolved    CAD Native artery -Patient known to have cad s/p CABG -Aspirin 81 mg daily -Not on Plavix at admission  DM type II uncontrolled with complication  -10/5 Hemoglobin A1c = 13.1  -Levemir 8 units daily -Sensitive SSI  NSTEMI -Cardiology has evaluated and no occlusion  responsible. Believes secondary to demand ischemia. See cardiology note  HTN. -DC Amlodipine 2.5 mg daily -Coreg 3.125 mg  BID .   Chronic Diastolic heart failure.  -S/P left heart cath: See results below -Echocardiogram pending  . -Strict in and out since admission +5.0L -Daily weight Filed Weights   09/08/16 0000 09/09/16 0400 09/10/16 1735  Weight: 72.6 kg (160 lb 0.9 oz) 74.7 kg (164 lb 10.9 oz) 71.7 kg (158 lb)  -Albumin 50 g 1 -Lasix IV 20 mg daily  Hypomagnesemia -Magnesium goal> 2 -Continue magnesium oxide 400 mg BID -Magnesium IV 2 g  Bilateral pleural effusion  -See heart failure -If no improvement with gentle diuresis obtained chest CT: Loculated effusion?  HLD -Pravastatin 20 mg daily  Acute renal failure (baseline Cr 0.9) Lab Results  Component Value Date   CREATININE 1.46 (H) 09/10/2016   CREATININE 1.35 (H) 09/09/2016   CREATININE 1.45 (H) 09/08/2016  -New baseline given patient's NSTEMI and contrast load?  ETOH Abuse. - No signs of withdrawal  -most likely, will continue thiamine, multivitamins. Will add lorazepam as needed. Continue citalopram.   Rib cage pain -Obtain PCXR: Pleural effusion with old rib fractures see results below      DVT prophylaxis: Heparin subcutaneous Code Status: Full Family Communication: None Disposition Plan: SNF   [facility]   Procedures/Significant Events:  10/10 left heart cath:-Prox LAD lesion, 80 %stenosed. Ost 1st Diag lesion, 80 %  stenosed. -- Prior to LIMA insertion. This is stable from 2012 -Mid LAD-1 lesion, 100 %stenosed -LIMA-LAD and is large and anatomically normal. Distal to LIMA, Mid LAD-2 lesion, 45 %stenosed 10/11 PCXR:-Bilateral pleural effusions with left lower lobe collapse/consolidation. -Old bilateral rib fractures.    Cultures MRSA PCR 10/5:  Negative Blood Ctx x2 10/5 >> Urine Ctx 10/5 >>  Antimicrobials: None   Devices    LINES / TUBES:  R  Femoral CVL 10/5 >> Foley 10/6 >>    Continuous Infusions: . heparin 1,000 Units/hr (09/10/16 2114)     Objective: Vitals:   09/10/16 1700 09/10/16 1706 09/10/16 1735 09/10/16 2054  BP: 126/62 126/62 127/64 116/60  Pulse: 97 (!) 109 (!) 101 91  Resp: 11  16 17   Temp:   98 F (36.7 C) 97.7 F (36.5 C)  TempSrc:   Oral Oral  SpO2: 95%  99% 98%  Weight:   71.7 kg (158 lb)   Height:   6' (1.829 m)     Intake/Output Summary (Last 24 hours) at 09/10/16 2115 Last data filed at 09/10/16 1824  Gross per 24 hour  Intake              470 ml  Output             1525 ml  Net            -1055 ml   Filed Weights   09/08/16 0000 09/09/16 0400 09/10/16 1735  Weight: 72.6 kg (160 lb 0.9 oz) 74.7 kg (164 lb 10.9 oz) 71.7 kg (158 lb)    Examination:  General: A/O 4, sleepy but arousable, No acute respiratory distress Eyes: negative scleral hemorrhage, negative anisocoria, negative icterus ENT: Negative Runny nose, negative gingival bleeding, Neck:  Negative scars, masses, torticollis, lymphadenopathy, JVD Lungs: Clear to auscultation bilaterally without wheezes or crackles Cardiovascular: Regular rate and rhythm without murmur gallop or rub normal S1 and S2, right lateral rib cage pain to palpation Abdomen: negative abdominal pain, nondistended, positive soft, bowel sounds, no rebound, no ascites, no appreciable mass Extremities: No significant cyanosis, clubbing, or edema bilateral lower extremities Skin: Negative rashes, lesions, ulcers Psychiatric:  Negative depression, negative anxiety, negative fatigue, negative mania  Central nervous system:  Cranial nerves II through XII intact, tongue/uvula midline, all extremities muscle strength 5/5, sensation intact throughout,, negative dysarthria, negative expressive aphasia, negative receptive aphasia.  .     Data Reviewed: Care during the described time interval was provided by me .  I have reviewed this patient's available data,  including medical history, events of note, physical examination, and all test results as part of my evaluation. I have personally reviewed and interpreted all radiology studies.  CBC:  Recent Labs Lab 09/05/16 0500 09/06/16 0427 09/08/16 0425 09/09/16 0400 09/10/16 0345  WBC 9.2 7.0 4.2 3.9* 5.0  NEUTROABS  --   --  2.9 2.2 3.1  HGB 11.5* 10.3* 9.7* 9.7* 10.1*  HCT 32.8* 29.8* 28.5* 28.5* 29.7*  MCV 78.1 78.8 81.2 80.5 81.4  PLT 191 154 197 273 342   Basic Metabolic Panel:  Recent Labs Lab 09/05/16 0500 09/07/16 0400 09/07/16 1131 09/08/16 0425 09/09/16 0400 09/10/16 0345  NA 134* 139 139 139 138 138  K 3.2* 3.0* 3.3* 3.6 3.4* 3.9  CL 100* 99* 102 101 103 103  CO2 26 29 30 28 28 23   GLUCOSE 220* 185* 121* 168* 147* 154*  BUN 62* 30* 28* 24* 24* 26*  CREATININE 2.62* 1.65* 1.59*  1.45* 1.35* 1.46*  CALCIUM 7.8* 7.9* 8.0* 7.9* 7.9* 7.9*  MG 1.7  --  1.5* 1.9 1.8 1.5*  PHOS 1.7*  --  2.9 3.6 3.7 3.7   GFR: Estimated Creatinine Clearance: 48.4 mL/min (by C-G formula based on SCr of 1.46 mg/dL (H)). Liver Function Tests:  Recent Labs Lab 09/05/16 0500 09/08/16 0425 09/09/16 0400 09/10/16 0345  ALBUMIN 1.7* 1.6* 1.5* 1.5*   No results for input(s): LIPASE, AMYLASE in the last 168 hours. No results for input(s): AMMONIA in the last 168 hours. Coagulation Profile:  Recent Labs Lab 09/08/16 0425  INR 0.95   Cardiac Enzymes:  Recent Labs Lab 09/04/16 0930 09/04/16 1638 09/07/16 1131 09/07/16 1828 09/07/16 2331  TROPONINI 0.99* 0.71* 2.82* 2.55* 1.91*   BNP (last 3 results) No results for input(s): PROBNP in the last 8760 hours. HbA1C: No results for input(s): HGBA1C in the last 72 hours. CBG:  Recent Labs Lab 09/09/16 1955 09/09/16 2335 09/10/16 0351 09/10/16 0759 09/10/16 1630  GLUCAP 94 113* 145* 175* 145*   Lipid Profile: No results for input(s): CHOL, HDL, LDLCALC, TRIG, CHOLHDL, LDLDIRECT in the last 72 hours. Thyroid Function  Tests: No results for input(s): TSH, T4TOTAL, FREET4, T3FREE, THYROIDAB in the last 72 hours. Anemia Panel: No results for input(s): VITAMINB12, FOLATE, FERRITIN, TIBC, IRON, RETICCTPCT in the last 72 hours. Urine analysis:    Component Value Date/Time   COLORURINE YELLOW 09/03/2016 0853   APPEARANCEUR HAZY (A) 09/03/2016 0853   LABSPEC 1.015 09/03/2016 0853   PHURINE 5.0 09/03/2016 0853   GLUCOSEU >1000 (A) 09/03/2016 0853   HGBUR SMALL (A) 09/03/2016 0853   BILIRUBINUR SMALL (A) 09/03/2016 0853   KETONESUR TRACE (A) 09/03/2016 0853   PROTEINUR NEGATIVE 09/03/2016 0853   UROBILINOGEN 0.2 11/05/2011 0806   NITRITE NEGATIVE 09/03/2016 0853   LEUKOCYTESUR NEGATIVE 09/03/2016 0853   Sepsis Labs: @LABRCNTIP (procalcitonin:4,lacticidven:4)  ) Recent Results (from the past 240 hour(s))  Blood culture (routine x 2)     Status: None   Collection Time: 09/03/16 10:54 AM  Result Value Ref Range Status   Specimen Description BLOOD LEFT FOREARM DRAWN BY RN  Final   Special Requests   Final    BOTTLES DRAWN AEROBIC AND ANAEROBIC AEB=6CC ANA=4CC   Culture NO GROWTH 5 DAYS  Final   Report Status 09/08/2016 FINAL  Final  Blood culture (routine x 2)     Status: None   Collection Time: 09/03/16 10:54 AM  Result Value Ref Range Status   Specimen Description BLOOD LEFT ANTECUBITAL DRAWN BY RN  Final   Special Requests BOTTLES DRAWN AEROBIC ONLY 5CC  Final   Culture NO GROWTH 5 DAYS  Final   Report Status 09/08/2016 FINAL  Final  MRSA PCR Screening     Status: None   Collection Time: 09/03/16  1:31 PM  Result Value Ref Range Status   MRSA by PCR NEGATIVE NEGATIVE Final    Comment:        The GeneXpert MRSA Assay (FDA approved for NASAL specimens only), is one component of a comprehensive MRSA colonization surveillance program. It is not intended to diagnose MRSA infection nor to guide or monitor treatment for MRSA infections.   Urine culture     Status: None   Collection Time:  09/03/16  2:48 PM  Result Value Ref Range Status   Specimen Description URINE, CATHETERIZED  Final   Special Requests NONE  Final   Culture NO GROWTH  Final   Report Status 09/04/2016 FINAL  Final         Radiology Studies: Dg Chest Port 1 View  Result Date: 09/09/2016 CLINICAL DATA:  Right rib pain for months.  No injury. EXAM: PORTABLE CHEST 1 VIEW COMPARISON:  09/03/2016. FINDINGS: Trachea is midline. Heart size stable. Thoracic aorta is calcified. There is left lower lobe collapse/ consolidation with bilateral pleural effusions. Old bilateral rib fractures. IMPRESSION: Bilateral pleural effusions with left lower lobe collapse/ consolidation. Electronically Signed   By: Leanna Battles M.D.   On: 09/09/2016 17:29        Scheduled Meds: . aspirin  81 mg Oral Daily  . carvedilol  6.25 mg Oral BID WC  . escitalopram  20 mg Oral Daily  . folic acid  1 mg Oral Daily  . furosemide  20 mg Intravenous Daily  . gabapentin  300 mg Oral TID  . insulin aspart  0-9 Units Subcutaneous Q4H  . insulin detemir  8 Units Subcutaneous Q24H  . magnesium oxide  400 mg Oral BID  . magnesium sulfate 1 - 4 g bolus IVPB  2 g Intravenous Once  . multivitamin with minerals  1 tablet Oral Daily  . pantoprazole  40 mg Oral Daily  . pravastatin  20 mg Oral q1800  . sodium chloride flush  3 mL Intravenous Q12H  . sucralfate  1 g Oral TID AC  . thiamine  100 mg Oral Daily   Or  . thiamine  100 mg Intravenous Daily   Continuous Infusions: . heparin 1,000 Units/hr (09/10/16 2114)     LOS: 7 days    Time spent: 40 minutes    WOODS, Roselind Messier, MD [facility] Pager [phone]   If 7PM-7AM, please contact night-coverage [email or web address] Password [email or web address] 09/10/2016, 9:15 PM

## 2016-09-10 NOTE — Progress Notes (Signed)
  Echocardiogram 2D Echocardiogram has been performed.  Delcie Roch 09/10/2016, 4:26 PM

## 2016-09-11 DIAGNOSIS — I420 Dilated cardiomyopathy: Secondary | ICD-10-CM

## 2016-09-11 DIAGNOSIS — I236 Thrombosis of atrium, auricular appendage, and ventricle as current complications following acute myocardial infarction: Secondary | ICD-10-CM | POA: Clinically undetermined

## 2016-09-11 DIAGNOSIS — N170 Acute kidney failure with tubular necrosis: Secondary | ICD-10-CM

## 2016-09-11 LAB — CBC WITH DIFFERENTIAL/PLATELET
BASOS ABS: 0 10*3/uL (ref 0.0–0.1)
Basophils Relative: 0 %
EOS PCT: 1 %
Eosinophils Absolute: 0.1 10*3/uL (ref 0.0–0.7)
HEMATOCRIT: 28.1 % — AB (ref 39.0–52.0)
HEMOGLOBIN: 9.4 g/dL — AB (ref 13.0–17.0)
LYMPHS ABS: 1.1 10*3/uL (ref 0.7–4.0)
LYMPHS PCT: 16 %
MCH: 27.2 pg (ref 26.0–34.0)
MCHC: 33.5 g/dL (ref 30.0–36.0)
MCV: 81.4 fL (ref 78.0–100.0)
Monocytes Absolute: 1 10*3/uL (ref 0.1–1.0)
Monocytes Relative: 15 %
NEUTROS ABS: 4.5 10*3/uL (ref 1.7–7.7)
NEUTROS PCT: 68 %
PLATELETS: 388 10*3/uL (ref 150–400)
RBC: 3.45 MIL/uL — AB (ref 4.22–5.81)
RDW: 15.4 % (ref 11.5–15.5)
WBC: 6.7 10*3/uL (ref 4.0–10.5)

## 2016-09-11 LAB — GLUCOSE, CAPILLARY
GLUCOSE-CAPILLARY: 113 mg/dL — AB (ref 65–99)
GLUCOSE-CAPILLARY: 137 mg/dL — AB (ref 65–99)
GLUCOSE-CAPILLARY: 182 mg/dL — AB (ref 65–99)
GLUCOSE-CAPILLARY: 213 mg/dL — AB (ref 65–99)
Glucose-Capillary: 212 mg/dL — ABNORMAL HIGH (ref 65–99)
Glucose-Capillary: 204 mg/dL — ABNORMAL HIGH (ref 65–99)
Glucose-Capillary: 229 mg/dL — ABNORMAL HIGH (ref 65–99)

## 2016-09-11 LAB — RENAL FUNCTION PANEL
Albumin: 2.4 g/dL — ABNORMAL LOW (ref 3.5–5.0)
Anion gap: 11 (ref 5–15)
BUN: 23 mg/dL — ABNORMAL HIGH (ref 6–20)
CHLORIDE: 102 mmol/L (ref 101–111)
CO2: 26 mmol/L (ref 22–32)
CREATININE: 1.39 mg/dL — AB (ref 0.61–1.24)
Calcium: 8.3 mg/dL — ABNORMAL LOW (ref 8.9–10.3)
GFR, EST AFRICAN AMERICAN: 58 mL/min — AB (ref >60)
GFR, EST NON AFRICAN AMERICAN: 50 mL/min — AB (ref >60)
Glucose, Bld: 171 mg/dL — ABNORMAL HIGH (ref 65–99)
POTASSIUM: 3.9 mmol/L (ref 3.5–5.1)
Phosphorus: 3.1 mg/dL (ref 2.5–4.6)
Sodium: 139 mmol/L (ref 135–145)

## 2016-09-11 LAB — HEPARIN LEVEL (UNFRACTIONATED)
Heparin Unfractionated: 0.43 IU/mL (ref 0.30–0.70)
Heparin Unfractionated: 0.53 IU/mL (ref 0.30–0.70)

## 2016-09-11 LAB — MAGNESIUM: MAGNESIUM: 1.9 mg/dL (ref 1.7–2.4)

## 2016-09-11 MED ORDER — INSULIN ASPART 100 UNIT/ML ~~LOC~~ SOLN
0.0000 [IU] | Freq: Three times a day (TID) | SUBCUTANEOUS | Status: DC
  Administered 2016-09-11: 3 [IU] via SUBCUTANEOUS
  Administered 2016-09-12: 5 [IU] via SUBCUTANEOUS
  Administered 2016-09-12 – 2016-09-13 (×2): 1 [IU] via SUBCUTANEOUS
  Administered 2016-09-13: 5 [IU] via SUBCUTANEOUS
  Administered 2016-09-13: 2 [IU] via SUBCUTANEOUS
  Administered 2016-09-14 (×2): 3 [IU] via SUBCUTANEOUS
  Administered 2016-09-14: 1 [IU] via SUBCUTANEOUS

## 2016-09-11 MED ORDER — SODIUM CHLORIDE 0.9 % IV SOLN
INTRAVENOUS | Status: DC
  Administered 2016-09-11: 250 mL via INTRAVENOUS

## 2016-09-11 MED ORDER — INSULIN DETEMIR 100 UNIT/ML ~~LOC~~ SOLN
10.0000 [IU] | SUBCUTANEOUS | Status: DC
  Administered 2016-09-12 – 2016-09-14 (×3): 10 [IU] via SUBCUTANEOUS
  Filled 2016-09-11 (×4): qty 0.1

## 2016-09-11 MED ORDER — ACETAMINOPHEN 500 MG PO TABS: 500.0000 mg | ORAL_TABLET | Freq: Four times a day (QID) | ORAL | Status: DC | PRN

## 2016-09-11 MED ORDER — OXYCODONE HCL 5 MG PO TABS
5.0000 mg | ORAL_TABLET | ORAL | Status: DC | PRN
  Administered 2016-09-11 – 2016-09-13 (×7): 5 mg via ORAL
  Filled 2016-09-11 (×8): qty 1

## 2016-09-11 MED ORDER — TRAMADOL HCL 50 MG PO TABS
50.0000 mg | ORAL_TABLET | Freq: Four times a day (QID) | ORAL | Status: DC | PRN
  Administered 2016-09-11 – 2016-09-14 (×5): 50 mg via ORAL
  Filled 2016-09-11 (×5): qty 1

## 2016-09-11 NOTE — Progress Notes (Signed)
[place] TEAM 1 - Stepdown/ICU TEAM  Lanae CrumblyJerry P Daigle  VHQ:[id number] DOB: [birth year] DOA: 09/03/2016 PCP: Josue HectorNYLAND,LEONARD ROBERT, MD    Brief Narrative:  70 y.o.M Hx DM2, HLD, CAD S/P CABG 2003 & S/P PCI to the proximal RCA (Resolute DES 3.0 mm x 15 mm) and mid Circumflex (Promus DES 2.5 mm x 28 mm) in 2012, Chronic Diastolic CHF, HTN, Substance abuse, and EtOH abuse who had been non-compliant with meds (reportedly stopped taking 1 month ago). [facility] due to AMS. He has a heavy ETOH hx but stopped drinking 3 days prior to presentation.  In ED, labs were suggestive of DKA vs HONK. He was felt to be unstable and [facility] for further evaluation and management. On admission UDS negative, EtOH level negative  Subjective: The pt c/o severe uncontrolled R sided chest wall pain, but he appeared comfortably and was carrying on a jovial conversation w/ a guest prior to my entry into the room.  He denies SSCP, n/v, abdom pain, or sob.    Assessment & Plan:  DKA in uncontrolled DM2 DKA resolved - 10/5 A1c 13.1 - CBG variable - follow without change in treatment plan today  Newly appreciated LV thrombus  IV heparin initiated - Cardiology following - f/u echo w/ contrast pending   CAD - NSTEMI s/p CABG - Cardiology has evaluated and no occlusion noted - believe secondary to demand ischemia  HTN Reasonably controlled at present - follow  Chronic Diastolic heart failure No evidence of significant volume overload on exam Filed Weights   09/08/16 0000 09/09/16 0400 09/10/16 1735  Weight: 72.6 kg (160 lb 0.9 oz) 74.7 kg (164 lb 10.9 oz) 71.7 kg (158 lb)    Hypomagnesemia Replaced  Bilateral pleural effusion  if no improvement with gentle diuresis will obtain chest CT to rule out loculations  HLD Pravastatin 20 mg daily  Acute renal failure (baseline Cr 0.9) crt appears to be stabel at ~1.4 - follow trend   Recent Labs Lab  09/07/16 1131 09/08/16 0425 09/09/16 0400 09/10/16 0345 09/11/16 0552  CREATININE 1.59* 1.45* 1.35* 1.46* 1.39*    ETOH Abuse No signs of withdrawal at present - of visiting friend confides with me today that the patient drinks heavily on a daily basis consuming "gallons of vodka" at a time as well as "wild [place] wine"  Rib cage pain CXR noted pleural effusion with old rib fractures   DVT prophylaxis: IV heparin  Code Status: FULL CODE Family Communication: no family present at time of exam  Disposition Plan: tele   Consultants:  Cardiology PCCM  Procedures: 10/10 left heart cath - Prox LAD lesion, 80% stenosed. Ost 1st Diag lesion, 80% stenosed. - Prior to LIMA insertion. This is stable from 2012 - Mid LAD-1 lesion, 100 %stenosed - LIMA-LAD and is large and anatomically normal. Distal to LIMA, Mid LAD-2 lesion, 45 %stenosed  Antimicrobials:  none  Objective: Blood pressure 138/67, pulse (!) 101, temperature 98.2 F (36.8 C), temperature source Oral, resp. rate 18, height 6' (1.829 m), weight 71.7 kg (158 lb), SpO2 98 %.  Intake/Output Summary (Last 24 hours) at 09/11/16 1320 Last data filed at 09/11/16 0914  Gross per 24 hour  Intake           337.67 ml  Output             2000 ml  Net         -  1662.33 ml   Filed Weights   09/08/16 0000 09/09/16 0400 09/10/16 1735  Weight: 72.6 kg (160 lb 0.9 oz) 74.7 kg (164 lb 10.9 oz) 71.7 kg (158 lb)    Examination: General: No acute respiratory distress Lungs: Clear to auscultation bilaterally without wheezes or crackles Cardiovascular: Regular rate and rhythm without murmur gallop or rub normal S1 and S2 Abdomen: Nontender, nondistended, soft, bowel sounds positive, no rebound, no ascites, no appreciable mass Extremities: No significant cyanosis, clubbing, or edema bilateral lower extremities  CBC:  Recent Labs Lab 09/06/16 0427 09/08/16 0425 09/09/16 0400 09/10/16 0345 09/11/16 0552  WBC 7.0 4.2 3.9* 5.0 6.7   NEUTROABS  --  2.9 2.2 3.1 4.5  HGB 10.3* 9.7* 9.7* 10.1* 9.4*  HCT 29.8* 28.5* 28.5* 29.7* 28.1*  MCV 78.8 81.2 80.5 81.4 81.4  PLT 154 197 273 342 388   Basic Metabolic Panel:  Recent Labs Lab 09/07/16 1131 09/08/16 0425 09/09/16 0400 09/10/16 0345 09/11/16 0552  NA 139 139 138 138 139  K 3.3* 3.6 3.4* 3.9 3.9  CL 102 101 103 103 102  CO2 30 28 28 23 26   GLUCOSE 121* 168* 147* 154* 171*  BUN 28* 24* 24* 26* 23*  CREATININE 1.59* 1.45* 1.35* 1.46* 1.39*  CALCIUM 8.0* 7.9* 7.9* 7.9* 8.3*  MG 1.5* 1.9 1.8 1.5* 1.9  PHOS 2.9 3.6 3.7 3.7 3.1   GFR: Estimated Creatinine Clearance: 50.9 mL/min (by C-G formula based on SCr of 1.39 mg/dL (H)).  Liver Function Tests:  Recent Labs Lab 09/05/16 0500 09/08/16 0425 09/09/16 0400 09/10/16 0345 09/11/16 0552  ALBUMIN 1.7* 1.6* 1.5* 1.5* 2.4*    Coagulation Profile:  Recent Labs Lab 09/08/16 0425  INR 0.95    Cardiac Enzymes:  Recent Labs Lab 09/04/16 1638 09/07/16 1131 09/07/16 1828 09/07/16 2331  TROPONINI 0.71* 2.82* 2.55* 1.91*    HbA1C: Hgb A1c MFr Bld  Date/Time Value Ref Range Status  09/03/2016 02:45 PM 13.1 (H) 4.8 - 5.6 % Final    Comment:    (NOTE)         Pre-diabetes: 5.7 - 6.4         Diabetes: >6.4         Glycemic control for adults with diabetes: <7.0   12/02/2015 12:07 PM 7.7 (H) 4.8 - 5.6 % Final    Comment:    (NOTE)         Pre-diabetes: 5.7 - 6.4         Diabetes: >6.4         Glycemic control for adults with diabetes: <7.0     CBG:  Recent Labs Lab 09/10/16 2055 09/11/16 0003 09/11/16 0453 09/11/16 0752 09/11/16 1209  GLUCAP 113* 113* 182* 137* 204*    Recent Results (from the past 240 hour(s))  Blood culture (routine x 2)     Status: None   Collection Time: 09/03/16 10:54 AM  Result Value Ref Range Status   Specimen Description BLOOD LEFT FOREARM DRAWN BY RN  Final   Special Requests   Final    BOTTLES DRAWN AEROBIC AND ANAEROBIC AEB=6CC ANA=4CC   Culture NO  GROWTH 5 DAYS  Final   Report Status 09/08/2016 FINAL  Final  Blood culture (routine x 2)     Status: None   Collection Time: 09/03/16 10:54 AM  Result Value Ref Range Status   Specimen Description BLOOD LEFT ANTECUBITAL DRAWN BY RN  Final   Special Requests BOTTLES DRAWN AEROBIC ONLY 5CC  Final   Culture NO GROWTH 5 DAYS  Final   Report Status 09/08/2016 FINAL  Final  MRSA PCR Screening     Status: None   Collection Time: 09/03/16  1:31 PM  Result Value Ref Range Status   MRSA by PCR NEGATIVE NEGATIVE Final    Comment:        The GeneXpert MRSA Assay (FDA approved for NASAL specimens only), is one component of a comprehensive MRSA colonization surveillance program. It is not intended to diagnose MRSA infection nor to guide or monitor treatment for MRSA infections.   Urine culture     Status: None   Collection Time: 09/03/16  2:48 PM  Result Value Ref Range Status   Specimen Description URINE, CATHETERIZED  Final   Special Requests NONE  Final   Culture NO GROWTH  Final   Report Status 09/04/2016 FINAL  Final     Scheduled Meds: . aspirin  81 mg Oral Daily  . carvedilol  6.25 mg Oral BID WC  . escitalopram  20 mg Oral Daily  . folic acid  1 mg Oral Daily  . furosemide  20 mg Intravenous Daily  . gabapentin  300 mg Oral TID  . insulin aspart  0-9 Units Subcutaneous Q4H  . insulin detemir  8 Units Subcutaneous Q24H  . magnesium oxide  400 mg Oral BID  . multivitamin with minerals  1 tablet Oral Daily  . pantoprazole  40 mg Oral Daily  . pravastatin  20 mg Oral q1800  . sodium chloride flush  3 mL Intravenous Q12H  . sucralfate  1 g Oral TID AC  . thiamine  100 mg Oral Daily   Or  . thiamine  100 mg Intravenous Daily   Continuous Infusions: . heparin 1,000 Units/hr (09/10/16 2114)     LOS: 8 days   Lonia Blood, MD [facility] Office  [phone] Pager - Text Page per Loretha Stapler as per below:  On-Call/Text Page:      [email or web address]      password  [email or web address]  If 7PM-7AM, please contact night-coverage [email or web address] Password [email or web address] 09/11/2016, 1:20 PM

## 2016-09-11 NOTE — Progress Notes (Signed)
Patient Name: Aaron Santiago Date of Encounter: 09/11/2016  Primary Cardiologist: Dr. Emi Holes Problem List     Active Problems:   Demand myocardial infarction   DKA (diabetic ketoacidoses) (HCC)   AKI (acute kidney injury) (HCC)   Hypothermia   Severe dehydration   Elevated troponin   Acute encephalopathy   Abnormal EKG   Nonsustained ventricular tachycardia (HCC)   Rib pain   Diabetic ketoacidosis with coma associated with type 2 diabetes mellitus (HCC)   Chronic diastolic CHF (congestive heart failure) (HCC)   Essential hypertension   ETOH abuse   Pleural effusion   Hyperchylomicronemia   Acute renal failure (HCC)    Subjective   Cardiac cath yesterday showed stable coronary disease. Suspect demand ischemia with stable proximal LAD-small diagonal disease with likely demand ischemia in the small diagonal to secretion that is not PCI target.  Now he clearly is indicating epigastric and right-sided right upper quadrant pain. No further anginal type pain. No point of dyspnea.  Inpatient Medications    Scheduled Meds: . aspirin  81 mg Oral Daily  . carvedilol  6.25 mg Oral BID WC  . escitalopram  20 mg Oral Daily  . folic acid  1 mg Oral Daily  . gabapentin  300 mg Oral TID  . insulin aspart  0-9 Units Subcutaneous TID WC  . [START ON 09/12/2016] insulin detemir  10 Units Subcutaneous Q24H  . magnesium oxide  400 mg Oral BID  . multivitamin with minerals  1 tablet Oral Daily  . pantoprazole  40 mg Oral Daily  . pravastatin  20 mg Oral q1800  . sucralfate  1 g Oral TID AC  . thiamine  100 mg Oral Daily   Or  . thiamine  100 mg Intravenous Daily   Continuous Infusions: . sodium chloride    . heparin 1,000 Units/hr (09/10/16 2114)   PRN Meds:.acetaminophen, LORazepam, metoprolol, ondansetron (ZOFRAN) IV, oxyCODONE, sodium chloride flush, traMADol   Vital Signs    Vitals:   09/10/16 1735 09/10/16 2054 09/11/16 0520 09/11/16 1435  BP: 127/64 116/60  138/67 129/66  Pulse: (!) 101 91 (!) 101 85  Resp: 16 17 18 18   Temp: 98 F (36.7 C) 97.7 F (36.5 C) 98.2 F (36.8 C) 98 F (36.7 C)  TempSrc: Oral Oral Oral Oral  SpO2: 99% 98% 98% 99%  Weight: 71.7 kg (158 lb)     Height: 6' (1.829 m)       Intake/Output Summary (Last 24 hours) at 09/11/16 1509 Last data filed at 09/11/16 0914  Gross per 24 hour  Intake           337.67 ml  Output             2000 ml  Net         -1662.33 ml   Filed Weights   09/08/16 0000 09/09/16 0400 09/10/16 1735  Weight: 72.6 kg (160 lb 0.9 oz) 74.7 kg (164 lb 10.9 oz) 71.7 kg (158 lb)    Physical Exam    GEN: ill appearing male in no acute distress. Still grouchy HEENT: Grossly normal, poor dentition Neck: Supple, no JVD, carotid bruits, or masses. Cardiac: Tachycardic but regular rhythm with some ectopy, no murmurs, rubs, or gallops. No clubbing, cyanosis, edema.  Radials/DP/PT 2+ and equal bilaterally.  Respiratory:  Respirations regular and unlabored, clear to auscultation bilaterally. GI: Soft, nontender, epigastric and right upper quadrant tenderness, BS + x 4. MS: no deformity or  atrophy. Skin: warm and dry, no rash. Neuro:  Strength and sensation are intact. Psych: AAOx3.  Lethargic  Labs    CBC  Recent Labs  09/10/16 0345 09/11/16 0552  WBC 5.0 6.7  NEUTROABS 3.1 4.5  HGB 10.1* 9.4*  HCT 29.7* 28.1*  MCV 81.4 81.4  PLT 342 388   Basic Metabolic Panel  Recent Labs  09/10/16 0345 09/11/16 0552  NA 138 139  K 3.9 3.9  CL 103 102  CO2 23 26  GLUCOSE 154* 171*  BUN 26* 23*  CREATININE 1.46* 1.39*  CALCIUM 7.9* 8.3*  MG 1.5* 1.9  PHOS 3.7 3.1   Liver Function Tests  Recent Labs  09/10/16 0345 09/11/16 0552  ALBUMIN 1.5* 2.4*   Cardiac Enzymes No results for input(s): CKTOTAL, CKMB, CKMBINDEX, TROPONINI in the last 72 hours.   Telemetry    NSR, had run of NSVT- Personally Reviewed  ECG    NSR, deep t wave inversion in anterolateral leads, new when  compared to old. - Personally Reviewed  Cardiology     CARDIAC CATH 09/08/2016  Prox LAD lesion, 80 %stenosed. Ost 1st Diag lesion, 80 %stenosed. -- Prior to LIMA insertion. This is stable from 2012  Mid LAD-1 lesion, 100 %stenosed.  LIMA-LAD and is large and anatomically normal. Distal to LIMA, Mid LAD-2 lesion, 45 %stenosed.  Prox RCA DES stent - 0 %stenosed.  Prox Cx to Mid Cx DES stent, 0 %stenosed.  LV end diastolic pressure is mildly elevated.  There is no aortic valve stenosis.   No angiographic evidence of a new lesion to explain non-ST elevation MI. I suspect that the elevation in troponin is related to demand ischemia with sinus tachycardia and ongoing illness.  Diagnostic Diagram     2-D Echocardiogram 09/10/2016:  EF 25-30% with anteroseptal, anterior and apical akinesis and grade 2 diastolic dysfunction. Cannot exclude apical thrombus. -- Distribution would suggest LAD ischemia/infarct.  Patient Profile     Aaron Santiago is a 70 year old male who presented with DKA and AMS. He has a long history of ETOH abuse and tobacco abuse. Cardiology was initially consulted for elevated troponin of 1.14 on 09/03/16 in the setting of DKA. Now with worsening EKG and NSVT.   Assessment & Plan    1. Abnormal EKG and chest pain = Demand Infarction: With positive troponin, and brief run of NSVT, there was concern for possible true ACS/type I MI/non-STEMI. As result he was taken to the cardiac catheterization lab but no new lesion found.  Despite no new lesions noted on catheterization, echocardiogram now shows a significant anterior wall motion abnormality with severely reduced EF.   Troponin elevation is then likely related to Demand Infarction with no culprit lesion.  Continue treatment with beta blocker. Unable to titrate up today as his second blood pressure reading this morning was in the 90s. -- Keep when necessary metoprolol for rapid heart rate -- I suspect the rapid heart  rate related to his existing LAD diagonal disease potentially is the culprit for his demand infarction.  Renal function is stable  Amlodipine discontinued and carvedilol increased to 625 twice a day.  New diagnosis of dilated cardiomyopathy with anterior wall motion abnormality and possible apical thrombus. -- He did have EKG changes and no sustained VT prior to catheterization with positive troponins, but the extent of wall motion abnormality is way out of proportion with the troponin elevation. I suspect that this may be a potential stress-induced cardiomyopathy related to his  ongoing illness as did not see any culprit lesion on catheterization done just prior to this echo.   He is on carvedilol which we have titrated up. At this time, nondominant blood pressure to consider ARB, but as the amlodipine wears off a suspect that we can potentially start ARB  Sees relatively euvolemic, however probably need to be on at least spironolactone for discharge.  We will check a relook 2-D echocardiogram with Definity contrast to see if there is indeed a LV thrombus. Currently on IV heparin. Concern for me is that with his significant drinking I would very much want to be sure that he hasn't thrombus prior to starting on oral anticoagulation   2. History of CAD s/p CABG: Patent stents and patent LIMA-LAD. Moderate disease in the LAD and distal RCA.  On aspirin, statin and beta blocker  3. DKA: Management per primary team  Signed,  Bryan Lemmaavid Harding, MD, MS Interventional Cardiologist   Pager # [phone] Phone # [phone] [address] [place], [place] [address]  09/09/2016 12:40 PM

## 2016-09-11 NOTE — Clinical Social Work Note (Signed)
Clinical Social Work Assessment  Patient Details  Name: Aaron Santiago MRN: [id number] Date of Birth: 14-Jul-1946  Date of referral:  09/11/16               Reason for consult:  Facility Placement                Permission sought to share information with:  Facility Medical sales representative, Family Supports Permission granted to share information::  No (Patient is disoriented; completed assessment with sister (left message for son))  Name::     Aaron Santiago  Agency::  SNFs  Relationship::  Sister  Contact Information:  [phone]  Housing/Transportation Living arrangements for the past 2 months:  Mobile Home Source of Information:  Other (Comment Required) (Sister) Patient Interpreter Needed:  None Criminal Activity/Legal Involvement Pertinent to Current Situation/Hospitalization:  No - Comment as needed Significant Relationships:  Siblings, Adult Children Lives with:  Self Do you feel safe going back to the place where you live?  No Need for family participation in patient care:  Yes (Comment)  Care giving concerns:  CSW received consult for possible SNF placement at time of discharge. Patient is disoriented. CSW left message for patient's son. CSW spoke with patient's sister regarding PT recommendation of SNF placement at time of discharge. Per patient's sister, patient lives alone next door to her. She reports that patient is currently unable to care for himself given patient's current physical state and altered mental status. Patient's sister expressed understanding of PT recommendation and is agreeable to SNF placement at time of discharge. CSW to continue to follow and assist with discharge planning needs.   Social Worker assessment / plan:  CSW spoke with patient's sister concerning possibility of rehab at [facility] before returning home.  Employment status:  Retired Health and safety inspector:  Armed forces operational officer, Medicaid In [employer] PT Recommendations:  Skilled Nursing Facility Information / Referral to  community resources:  Skilled Nursing Facility  Patient/Family's Response to care:  Patient's sister recognizes need for rehab before returning home and is agreeable to a SNF in [place]. Patient's sister reported preference for [facility].  Patient/Family's Understanding of and Emotional Response to Diagnosis, Current Treatment, and Prognosis:  Patient/family is realistic regarding therapy needs and expressed being hopeful for SNF placement. Patient's sister expressed understanding of CSW role and discharge process. No questions/concerns about plan or treatment.    Emotional Assessment Appearance:  Appears stated age Attitude/Demeanor/Rapport:  Unable to Assess Affect (typically observed):  Unable to Assess Orientation:  Oriented to Self, Oriented to Place Alcohol / Substance use:  Alcohol Use Psych involvement (Current and /or in the community):  No (Comment)  Discharge Needs  Concerns to be addressed:  Care Coordination Readmission within the last 30 days:  No Current discharge risk:  Lives alone Barriers to Discharge:  Continued Medical Work up   [employer], LCSWA 09/11/2016, 3:41 PM

## 2016-09-11 NOTE — Progress Notes (Signed)
ANTICOAGULATION CONSULT NOTE Pharmacy Consult for Heparin Indication:  LV thrombus  No Known Allergies  Patient Measurements: Height: 6' (182.9 cm) Weight: 158 lb (71.7 kg) IBW/kg (Calculated) : 77.6   Vital Signs: Temp: 98 F (36.7 C) (10/13 1435) Temp Source: Oral (10/13 1435) BP: 129/66 (10/13 1435) Pulse Rate: 85 (10/13 1435)  Labs:  Recent Labs  09/09/16 0400 09/10/16 0345 09/11/16 0552 09/11/16 1722  HGB 9.7* 10.1* 9.4*  --   HCT 28.5* 29.7* 28.1*  --   PLT 273 342 388  --   HEPARINUNFRC  --   --  0.43 0.53  CREATININE 1.35* 1.46* 1.39*  --     Estimated Creatinine Clearance: 50.9 mL/min (by C-G formula based on SCr of 1.39 mg/dL (H)).  Assessment: 70 y.o. male with LV thrombus for heparin  Goal of Therapy:  Heparin level 0.3-0.7 units/ml Monitor platelets by anticoagulation protocol: Yes   Plan:  Continue Heparin at current rate  F/U plan for anticoagulation  Thank you Okey Regal, PharmD [phone]  09/11/2016,6:09 PM

## 2016-09-11 NOTE — Progress Notes (Signed)
ANTICOAGULATION CONSULT NOTE Pharmacy Consult for Heparin Indication:  LV thrombus  No Known Allergies  Patient Measurements: Height: 6' (182.9 cm) Weight: 158 lb (71.7 kg) IBW/kg (Calculated) : 77.6   Vital Signs: Temp: 98.2 F (36.8 C) (10/13 0520) Temp Source: Oral (10/13 0520) BP: 138/67 (10/13 0520) Pulse Rate: 101 (10/13 0520)  Labs:  Recent Labs  09/09/16 0400 09/10/16 0345 09/11/16 0552  HGB 9.7* 10.1* 9.4*  HCT 28.5* 29.7* 28.1*  PLT 273 342 388  HEPARINUNFRC  --   --  0.43  CREATININE 1.35* 1.46* 1.39*    Estimated Creatinine Clearance: 50.9 mL/min (by C-G formula based on SCr of 1.39 mg/dL (H)).  Assessment: 70 y.o. male with LV thrombus for heparin  Goal of Therapy:  Heparin level 0.3-0.7 units/ml Monitor platelets by anticoagulation protocol: Yes   Plan:  Continue Heparin at current rate  F/U plan for anticoagulation  Geannie Risen, PharmD, BCPS  09/11/2016,6:47 AM

## 2016-09-11 NOTE — Progress Notes (Signed)
Physical Therapy Treatment Patient Details Name: Aaron CrumblyJerry P Partch MRN: [id number] DOB: 01/16/1946 Today's Date: 09/11/2016    History of Present Illness Pt adm with DKA. Had some chest pain and underwent cardiac cath on 10/10. PMH -DM, CAD, ETOH,, chf    PT Comments    Patient is making progress toward mobility goals however continues to demonstrate decreased strength, mobility, and activity tolerance. Pt also continues to have some confusion. Current plan remains appropriate.   Follow Up Recommendations  SNF     Equipment Recommendations  Other (comment) (To be assessed)    Recommendations for Other Services OT consult     Precautions / Restrictions Precautions Precautions: Fall Restrictions Weight Bearing Restrictions: No    Mobility  Bed Mobility Overal bed mobility: Needs Assistance Bed Mobility: Supine to Sit     Supine to sit: Min assist;HOB elevated     General bed mobility comments: assist to elevate trunk into sitting  Transfers Overall transfer level: Needs assistance Equipment used: Rolling walker (2 wheeled) Transfers: Sit to/from Stand Sit to Stand: Min assist         General transfer comment: assist to power up into standing; cues for posture upon standing; pt able to improve posture but maintained slight trunk flexion  Ambulation/Gait Ambulation/Gait assistance: Min assist;+2 safety/equipment (chair follow) Ambulation Distance (Feet): 75 Feet Assistive device: Rolling walker (2 wheeled) Gait Pattern/deviations: Step-to pattern;Decreased step length - left;Decreased stance time - right;Decreased weight shift to right;Decreased dorsiflexion - right;Decreased dorsiflexion - left Gait velocity: decreased   General Gait Details: pt with shuffling gait initially and able to improve step length/height with increased distance however steps remained short; cues for posture/forward gaze and increased bilat step length/heel strike; pt began demonstrating R  LE weakness and increased pain and sat in recliner   Stairs            Wheelchair Mobility    Modified Rankin (Stroke Patients Only)       Balance     Sitting balance-Leahy Scale: Fair       Standing balance-Leahy Scale: Poor                      Cognition Arousal/Alertness: Awake/alert Behavior During Therapy: WFL for tasks assessed/performed Overall Cognitive Status: Impaired/Different from baseline Area of Impairment: Problem solving;Following commands     Memory: Decreased short-term memory Following Commands: Follows one step commands with increased time     Problem Solving: Slow processing;Requires verbal cues;Decreased initiation General Comments: pt could not remember if he had breakfast and wondered if it was breakfast time; NT reported pt had breakfast this morning; pt reassured that he had breakast and lunch will be soon but appeared to not believe therapist    Exercises      General Comments        Pertinent Vitals/Pain Pain Assessment: Faces Faces Pain Scale: Hurts little more Pain Location: R groin/anterior thigh area Pain Descriptors / Indicators: Aching;Grimacing;Guarding;Sore Pain Intervention(s): Limited activity within patient's tolerance;Monitored during session;Premedicated before session;Repositioned    Home Living                      Prior Function            PT Goals (current goals can now be found in the care plan section) Acute Rehab PT Goals Patient Stated Goal: go home Progress towards PT goals: Progressing toward goals    Frequency    Min 3X/week  PT Plan Current plan remains appropriate    Co-evaluation             End of Session Equipment Utilized During Treatment: Gait belt Activity Tolerance: Patient tolerated treatment well Patient left: in chair;with call bell/phone within reach;with chair alarm set     Time: 5009-3818 PT Time Calculation (min) (ACUTE ONLY): 27  min  Charges:  $Gait Training: 8-22 mins $Therapeutic Activity: 8-22 mins                    G Codes:      Derek Mound, PTA Pager: [phone]   09/11/2016, 1:54 PM

## 2016-09-12 ENCOUNTER — Other Ambulatory Visit (HOSPITAL_COMMUNITY): Payer: Self-pay

## 2016-09-12 ENCOUNTER — Inpatient Hospital Stay (HOSPITAL_COMMUNITY): Payer: Medicare Other

## 2016-09-12 DIAGNOSIS — I513 Intracardiac thrombosis, not elsewhere classified: Secondary | ICD-10-CM

## 2016-09-12 DIAGNOSIS — I509 Heart failure, unspecified: Secondary | ICD-10-CM

## 2016-09-12 DIAGNOSIS — I5021 Acute systolic (congestive) heart failure: Secondary | ICD-10-CM

## 2016-09-12 LAB — CBC
HCT: 26.6 % — ABNORMAL LOW (ref 39.0–52.0)
Hemoglobin: 8.9 g/dL — ABNORMAL LOW (ref 13.0–17.0)
MCH: 27.3 pg (ref 26.0–34.0)
MCHC: 33.5 g/dL (ref 30.0–36.0)
MCV: 81.6 fL (ref 78.0–100.0)
PLATELETS: 464 10*3/uL — AB (ref 150–400)
RBC: 3.26 MIL/uL — AB (ref 4.22–5.81)
RDW: 15.3 % (ref 11.5–15.5)
WBC: 7.4 10*3/uL (ref 4.0–10.5)

## 2016-09-12 LAB — ECHOCARDIOGRAM LIMITED
HEIGHTINCHES: 72 in
WEIGHTICAEL: 2528 [oz_av]

## 2016-09-12 LAB — COMPREHENSIVE METABOLIC PANEL
ALBUMIN: 1.9 g/dL — AB (ref 3.5–5.0)
ALT: 27 U/L (ref 17–63)
ANION GAP: 9 (ref 5–15)
AST: 29 U/L (ref 15–41)
Alkaline Phosphatase: 122 U/L (ref 38–126)
BUN: 16 mg/dL (ref 6–20)
CO2: 30 mmol/L (ref 22–32)
Calcium: 8 mg/dL — ABNORMAL LOW (ref 8.9–10.3)
Chloride: 100 mmol/L — ABNORMAL LOW (ref 101–111)
Creatinine, Ser: 1.27 mg/dL — ABNORMAL HIGH (ref 0.61–1.24)
GFR calc Af Amer: >60 mL/min (ref >60)
GFR calc non Af Amer: 56 mL/min — ABNORMAL LOW (ref >60)
GLUCOSE: 248 mg/dL — AB (ref 65–99)
POTASSIUM: 3.3 mmol/L — AB (ref 3.5–5.1)
SODIUM: 139 mmol/L (ref 135–145)
Total Bilirubin: 0.5 mg/dL (ref 0.3–1.2)
Total Protein: 4.5 g/dL — ABNORMAL LOW (ref 6.5–8.1)

## 2016-09-12 LAB — GLUCOSE, CAPILLARY
GLUCOSE-CAPILLARY: 64 mg/dL — AB (ref 65–99)
GLUCOSE-CAPILLARY: 203 mg/dL — AB (ref 65–99)
Glucose-Capillary: 282 mg/dL — ABNORMAL HIGH (ref 65–99)
Glucose-Capillary: 257 mg/dL — ABNORMAL HIGH (ref 65–99)
Glucose-Capillary: 147 mg/dL — ABNORMAL HIGH (ref 65–99)

## 2016-09-12 LAB — HEPARIN LEVEL (UNFRACTIONATED): HEPARIN UNFRACTIONATED: 0.4 [IU]/mL (ref 0.30–0.70)

## 2016-09-12 LAB — MAGNESIUM: Magnesium: 1.5 mg/dL — ABNORMAL LOW (ref 1.7–2.4)

## 2016-09-12 MED ORDER — LISINOPRIL 5 MG PO TABS
2.5000 mg | ORAL_TABLET | Freq: Every day | ORAL | Status: DC
  Administered 2016-09-12 – 2016-09-14 (×3): 2.5 mg via ORAL
  Filled 2016-09-12 (×3): qty 1

## 2016-09-12 MED ORDER — POTASSIUM CHLORIDE CRYS ER 20 MEQ PO TBCR
40.0000 meq | EXTENDED_RELEASE_TABLET | Freq: Once | ORAL | Status: AC
  Administered 2016-09-12: 40 meq via ORAL
  Filled 2016-09-12: qty 2

## 2016-09-12 MED ORDER — POLYETHYLENE GLYCOL 3350 17 G PO PACK
17.0000 g | PACK | Freq: Two times a day (BID) | ORAL | Status: DC
Start: 2016-09-12 — End: 2016-09-13
  Administered 2016-09-12 – 2016-09-13 (×2): 17 g via ORAL
  Filled 2016-09-12 (×2): qty 1

## 2016-09-12 MED ORDER — SENNOSIDES-DOCUSATE SODIUM 8.6-50 MG PO TABS
2.0000 | ORAL_TABLET | Freq: Two times a day (BID) | ORAL | Status: DC
  Administered 2016-09-12 – 2016-09-13 (×2): 2 via ORAL
  Filled 2016-09-12 (×2): qty 2

## 2016-09-12 MED ORDER — SPIRONOLACTONE 25 MG PO TABS
12.5000 mg | ORAL_TABLET | Freq: Every day | ORAL | Status: DC
  Administered 2016-09-12 – 2016-09-14 (×3): 12.5 mg via ORAL
  Filled 2016-09-12 (×3): qty 1

## 2016-09-12 MED ORDER — MAGNESIUM SULFATE 2 GM/50ML IV SOLN
2.0000 g | Freq: Once | INTRAVENOUS | Status: AC
  Administered 2016-09-12: 2 g via INTRAVENOUS
  Filled 2016-09-12: qty 50

## 2016-09-12 MED ORDER — PERFLUTREN LIPID MICROSPHERE
1.0000 mL | INTRAVENOUS | Status: AC | PRN
  Administered 2016-09-12: 2 mL via INTRAVENOUS
  Filled 2016-09-12: qty 10

## 2016-09-12 NOTE — Progress Notes (Signed)
ANTICOAGULATION CONSULT NOTE Pharmacy Consult for Heparin Indication:  LV thrombus  No Known Allergies  Patient Measurements: Height: 6' (182.9 cm) Weight: 158 lb (71.7 kg) IBW/kg (Calculated) : 77.6   Vital Signs: Temp: 98.4 F (36.9 C) (10/14 0500) Temp Source: Oral (10/14 0500) BP: 104/76 (10/14 0500) Pulse Rate: 94 (10/14 0500)  Labs:  Recent Labs  09/10/16 0345 09/11/16 0552 09/11/16 1722 09/12/16 0819  HGB 10.1* 9.4*  --  8.9*  HCT 29.7* 28.1*  --  26.6*  PLT 342 388  --  464*  HEPARINUNFRC  --  0.43 0.53 0.40  CREATININE 1.46* 1.39*  --  1.27*    Estimated Creatinine Clearance: 55.7 mL/min (by C-G formula based on SCr of 1.27 mg/dL (H)).  Assessment: 70 y.o. male with LV thrombus for heparin. PLTC 342>>464 today, Hgb 10.1>>8.9 today No bleeding noted.   Heparin level therapeutic x3, most recently 0.4  Goal of Therapy:  Heparin level 0.3-0.7 units/ml Monitor platelets by anticoagulation protocol: Yes   Plan:  Continue Heparin at current rate of 1000 F/U plan for anticoagulation Heparin level, CBC daily Monitor for s/sx bleeding  Allena Katz, Pharm.D. PGY1 Pharmacy Resident 10/14/201711:18 AM Pager [phone]  09/12/2016,11:06 AM

## 2016-09-12 NOTE — Progress Notes (Signed)
[facility] Progress Note  Patient: Aaron Santiago:[id number]   PCP: Josue Hector, MD DOB: March 23, 1946   DOA: 09/03/2016   DOS: 09/12/2016   Date of Service: the patient was seen and examined on 09/12/2016  Brief hospital course: Pt. with PMH of DM2, HLD, CAD S/P CABG 2003 & S/P PCI to the proximal RCA (Resolute DES 3.0 mm x 15 mm) and mid Circumflex (Promus DES 2.5 mm x 28 mm) in 2012, Chronic Diastolic CHF, HTN, Substance abuse, and EtOH abuse who had been non-compliant with meds (reportedly stopped taking 1 month ago). He presented to [facility] ED on 10/5 via EMS due to AMS. He has a heavy ETOH hx but stopped drinking 3 days prior to presentation.  In ED, labs were suggestive of DKA vs HONK. He was felt to be unstable and was transferred to [facility] ICU for further evaluation and management. On admission UDSnegative, EtOH level negative Currently further plan is Continue treatment for constipation and pain management.  Assessment and Plan: 1. DKA, uncontrolled type 2 diabetes mellitus. Currently resolved. Anion gap is closed. Continue current insulin. CBG actually on a lower side.  2. Coronary artery disease, non-STEMI. Chronic combined CHF systolic and diastolic. Newly appreciated at left ventricle thrombus. On IV heparin, cardiology following. S/P cardiac catheterization without any occlusion. Blood pressure well controlled. Continue to monitor. No evidence of volume overload as well.  3. Abdominal pain in the right groin. Patient complains about) abdominal pain. X-ray shows constipation. We'll start the patient on stool softener.  4. Bilateral pleural effusion. Repeat chest x-ray today shows small pleural effusion otherwise clear lungs. We will use incentive spirometry. Significant improvement over earlier study.  5. Acute kidney injury. Improving. Monitor.  6. Alcohol abuse. No evidence of withdrawal at present. As per report from the stepdown unit, the  patient drinks heavily on daily basis. Monitor closely.  Pain management: Currently on OxyIR 5-10 mg every 4 hours. We will down titrate starting tomorrow Activity: SNF physical therapy Bowel regimen: last BM 09/10/2016 Diet: Cardiac diet DVT Prophylaxis: subcutaneous Heparin  Advance goals of care discussion: Full code  Family Communication: family was present at bedside, at the time of interview. The pt provided permission to discuss medical plan with the family. Opportunity was given to ask question and all questions were answered satisfactorily.   Disposition:  Discharge to SNF. Expected discharge date: 09/14/2016  Consultants: Cardiology Procedures: Echocardiogram  Antibiotics: Anti-infectives    Start     Dose/Rate Route Frequency Ordered Stop   09/03/16 1045  vancomycin (VANCOCIN) IVPB 1000 mg/200 mL premix     1,000 mg 200 mL/hr over 60 Minutes Intravenous  Once 09/03/16 1040 09/03/16 1247   09/03/16 1045  piperacillin-tazobactam (ZOSYN) IVPB 3.375 g     3.375 g 100 mL/hr over 30 Minutes Intravenous  Once 09/03/16 1040 09/03/16 1158        Subjective: Patient complains about right groin pain. No complains about chest pain or abdominal pain as well. No nausea no vomiting. No diarrhea no constipation.  Objective: Physical Exam: Vitals:   09/11/16 1435 09/11/16 2146 09/12/16 0500 09/12/16 1410  BP: 129/66 132/70 104/76 (!) 105/55  Pulse: 85 92 94 84  Resp: 18 18 18 17   Temp: 98 F (36.7 C) 98.1 F (36.7 C) 98.4 F (36.9 C) 98.4 F (36.9 C)  TempSrc: Oral Oral Oral Oral  SpO2: 99% 100% 94% 97%  Weight:      Height:  Intake/Output Summary (Last 24 hours) at 09/12/16 1937 Last data filed at 09/12/16 0930  Gross per 24 hour  Intake              350 ml  Output              550 ml  Net             -200 ml   Filed Weights   09/08/16 0000 09/09/16 0400 09/10/16 1735  Weight: 72.6 kg (160 lb 0.9 oz) 74.7 kg (164 lb 10.9 oz) 71.7 kg (158 lb)     General: Alert, Awake and Oriented to Time, Place and Person. Appear in mild distress, affect appropriate Eyes: PERRL, Conjunctiva normal ENT: Oral Mucosa clear moist. Neck: no JVD, no Abnormal Mass Or lumps Cardiovascular: S1 and S2 Present, no Murmur, Respiratory: Bilateral Air entry equal and Decreased, jno use of accessory muscle, Clear to Auscultation, no Crackles, no wheezes Abdomen: Bowel Sound present, Soft and no tenderness Skin: no redness, no Rash, no induration Extremities: no Pedal edema, no calf tenderness Neurologic: Grossly no focal neuro deficit. Bilaterally Equal motor strength  Data Reviewed: CBC:  Recent Labs Lab 09/08/16 0425 09/09/16 0400 09/10/16 0345 09/11/16 0552 09/12/16 0819  WBC 4.2 3.9* 5.0 6.7 7.4  NEUTROABS 2.9 2.2 3.1 4.5  --   HGB 9.7* 9.7* 10.1* 9.4* 8.9*  HCT 28.5* 28.5* 29.7* 28.1* 26.6*  MCV 81.2 80.5 81.4 81.4 81.6  PLT 197 273 342 388 464*   Basic Metabolic Panel:  Recent Labs Lab 09/07/16 1131 09/08/16 0425 09/09/16 0400 09/10/16 0345 09/11/16 0552 09/12/16 0819  NA 139 139 138 138 139 139  K 3.3* 3.6 3.4* 3.9 3.9 3.3*  CL 102 101 103 103 102 100*  CO2 30 28 28 23 26 30   GLUCOSE 121* 168* 147* 154* 171* 248*  BUN 28* 24* 24* 26* 23* 16  CREATININE 1.59* 1.45* 1.35* 1.46* 1.39* 1.27*  CALCIUM 8.0* 7.9* 7.9* 7.9* 8.3* 8.0*  MG 1.5* 1.9 1.8 1.5* 1.9 1.5*  PHOS 2.9 3.6 3.7 3.7 3.1  --     Liver Function Tests:  Recent Labs Lab 09/08/16 0425 09/09/16 0400 09/10/16 0345 09/11/16 0552 09/12/16 0819  AST  --   --   --   --  29  ALT  --   --   --   --  27  ALKPHOS  --   --   --   --  122  BILITOT  --   --   --   --  0.5  PROT  --   --   --   --  4.5*  ALBUMIN 1.6* 1.5* 1.5* 2.4* 1.9*   No results for input(s): LIPASE, AMYLASE in the last 168 hours. No results for input(s): AMMONIA in the last 168 hours. Coagulation Profile:  Recent Labs Lab 09/08/16 0425  INR 0.95   Cardiac Enzymes:  Recent Labs Lab  09/07/16 1131 09/07/16 1828 09/07/16 2331  TROPONINI 2.82* 2.55* 1.91*   BNP (last 3 results) No results for input(s): PROBNP in the last 8760 hours.  CBG:  Recent Labs Lab 09/11/16 2226 09/12/16 0520 09/12/16 0748 09/12/16 1137 09/12/16 1804  GLUCAP 213* 282* 257* 147* 64*    Studies: Dg Abd Acute W/chest  Result Date: 09/12/2016 CLINICAL DATA:  CP and SOB x2-3 months. RLQ pain. Lung collapse. Med Hx: Diabetes Hypertension Peptic Ulcer disease Hyperlipidemia GERD CAD CHF Carotid Artery Stenosis Surg Hx: Cardiac Cath 2017 Coronary Artery Bypass  Graft (CABG) 2002 Stent placement 2012 EXAM: DG ABDOMEN ACUTE W/ 1V CHEST COMPARISON:  Chest x-rays dated 09/09/2016 and 09/03/2016. Comparison also made to a CT abdomen dated 09/27/2015. FINDINGS: Single view of the chest: Study is hypoinspiratory with crowding of the perihilar and bibasilar bronchovascular markings. Suspect mild atelectasis and/or small pleural effusions at each lung base. Given the low lung volumes, lungs appear otherwise clear. Heart size and mediastinal contours are normal. Median sternotomy wires are stable in alignment, related to previous CABG. Supine and upright views of the abdomen: Bowel gas pattern is nonobstructive. Fairly large amount of stool noted in the right colon. No evidence of soft tissue mass or abnormal fluid collection seen. No evidence of free intraperitoneal air. Surgical sutures noted within the upper abdomen. Mild degenerative change noted within the slightly scoliotic thoracolumbar spine. No acute or suspicious osseous finding. IMPRESSION: 1. Low lung volumes. Probable mild bibasilar atelectasis and/or small pleural effusions. Lungs otherwise clear. Heart size is normal. Surgical changes of previous CABG. 2. Nonobstructive bowel gas pattern. Fairly large amount of stool in the right colon (constipation? ). Electronically Signed   By: Bary Richard M.D.   On: 09/12/2016 15:47     Scheduled Meds: .  aspirin  81 mg Oral Daily  . carvedilol  6.25 mg Oral BID WC  . escitalopram  20 mg Oral Daily  . folic acid  1 mg Oral Daily  . gabapentin  300 mg Oral TID  . insulin aspart  0-9 Units Subcutaneous TID WC  . insulin detemir  10 Units Subcutaneous Q24H  . lisinopril  2.5 mg Oral Daily  . multivitamin with minerals  1 tablet Oral Daily  . pantoprazole  40 mg Oral Daily  . polyethylene glycol  17 g Oral BID  . pravastatin  20 mg Oral q1800  . senna-docusate  2 tablet Oral BID  . spironolactone  12.5 mg Oral Daily  . sucralfate  1 g Oral TID AC  . thiamine  100 mg Oral Daily   Or  . thiamine  100 mg Intravenous Daily   Continuous Infusions: . sodium chloride 250 mL (09/11/16 2215)  . heparin 1,000 Units/hr (09/11/16 1932)   PRN Meds: acetaminophen, LORazepam, ondansetron (ZOFRAN) IV, oxyCODONE, sodium chloride flush, traMADol  Time spent: 30 minutes  Author: Lynden Oxford, MD [facility] Pager: [phone] 09/12/2016 7:37 PM  If 7PM-7AM, please contact night-coverage at [email or web address], password [email or web address]

## 2016-09-12 NOTE — Progress Notes (Signed)
Patient ID: Aaron CrumblyJerry P Santiago, male   DOB: 07-08-1946, 70 y.o.   MRN: [id number]   SUBJECTIVE: Complains of right flank pain, wants pain medicine.  No dyspnea or chest pain.   Scheduled Meds: . aspirin  81 mg Oral Daily  . carvedilol  6.25 mg Oral BID WC  . escitalopram  20 mg Oral Daily  . folic acid  1 mg Oral Daily  . gabapentin  300 mg Oral TID  . insulin aspart  0-9 Units Subcutaneous TID WC  . insulin detemir  10 Units Subcutaneous Q24H  . lisinopril  2.5 mg Oral Daily  . multivitamin with minerals  1 tablet Oral Daily  . pantoprazole  40 mg Oral Daily  . pravastatin  20 mg Oral q1800  . spironolactone  12.5 mg Oral Daily  . sucralfate  1 g Oral TID AC  . thiamine  100 mg Oral Daily   Or  . thiamine  100 mg Intravenous Daily   Continuous Infusions: . sodium chloride 250 mL (09/11/16 2215)  . heparin 1,000 Units/hr (09/11/16 1932)   PRN Meds:.acetaminophen, LORazepam, ondansetron (ZOFRAN) IV, oxyCODONE, sodium chloride flush, traMADol    Vitals:   09/11/16 0520 09/11/16 1435 09/11/16 2146 09/12/16 0500  BP: 138/67 129/66 132/70 104/76  Pulse: (!) 101 85 92 94  Resp: 18 18 18 18   Temp: 98.2 F (36.8 C) 98 F (36.7 C) 98.1 F (36.7 C) 98.4 F (36.9 C)  TempSrc: Oral Oral Oral Oral  SpO2: 98% 99% 100% 94%  Weight:      Height:        Intake/Output Summary (Last 24 hours) at 09/12/16 1354 Last data filed at 09/12/16 0930  Gross per 24 hour  Intake            757.5 ml  Output              550 ml  Net            207.5 ml    LABS: Basic Metabolic Panel:  Recent Labs  62/13/809/11/15 0345 09/11/16 0552 09/12/16 0819  NA 138 139 139  K 3.9 3.9 3.3*  CL 103 102 100*  CO2 23 26 30   GLUCOSE 154* 171* 248*  BUN 26* 23* 16  CREATININE 1.46* 1.39* 1.27*  CALCIUM 7.9* 8.3* 8.0*  MG 1.5* 1.9 1.5*  PHOS 3.7 3.1  --    Liver Function Tests:  Recent Labs  09/11/16 0552 09/12/16 0819  AST  --  29  ALT  --  27  ALKPHOS  --  122  BILITOT  --  0.5  PROT  --  4.5*   ALBUMIN 2.4* 1.9*   No results for input(s): LIPASE, AMYLASE in the last 72 hours. CBC:  Recent Labs  09/10/16 0345 09/11/16 0552 09/12/16 0819  WBC 5.0 6.7 7.4  NEUTROABS 3.1 4.5  --   HGB 10.1* 9.4* 8.9*  HCT 29.7* 28.1* 26.6*  MCV 81.4 81.4 81.6  PLT 342 388 464*   Cardiac Enzymes: No results for input(s): CKTOTAL, CKMB, CKMBINDEX, TROPONINI in the last 72 hours. BNP: Invalid input(s): POCBNP D-Dimer: No results for input(s): DDIMER in the last 72 hours. Hemoglobin A1C: No results for input(s): HGBA1C in the last 72 hours. Fasting Lipid Panel: No results for input(s): CHOL, HDL, LDLCALC, TRIG, CHOLHDL, LDLDIRECT in the last 72 hours. Thyroid Function Tests: No results for input(s): TSH, T4TOTAL, T3FREE, THYROIDAB in the last 72 hours.  Invalid input(s): FREET3 Anemia Panel: No  results for input(s): VITAMINB12, FOLATE, FERRITIN, TIBC, IRON, RETICCTPCT in the last 72 hours.  RADIOLOGY: Ct Head Wo Contrast  Result Date: 08/14/2016 CLINICAL DATA:  Altered mental status EXAM: CT HEAD WITHOUT CONTRAST TECHNIQUE: Contiguous axial images were obtained from the base of the skull through the vertex without intravenous contrast. COMPARISON:  None. FINDINGS: Brain: There is atrophy and chronic small vessel disease changes. No acute intracranial abnormality. Specifically, no hemorrhage, hydrocephalus, mass lesion, acute infarction, or significant intracranial injury. Vascular: No hyperdense vessel or unexpected calcification. Skull: No acute calvarial abnormality. Sinuses/Orbits: Mucosal thickening throughout the paranasal sinuses. No 12 levels. Orbital soft tissues unremarkable. Other: None IMPRESSION: No acute intracranial abnormality. Mild chronic sinusitis. Electronically Signed   By: Charlett Nose M.D.   On: 08/14/2016 17:09   Dg Chest Port 1 View  Result Date: 09/09/2016 CLINICAL DATA:  Right rib pain for months.  No injury. EXAM: PORTABLE CHEST 1 VIEW COMPARISON:  09/03/2016.  FINDINGS: Trachea is midline. Heart size stable. Thoracic aorta is calcified. There is left lower lobe collapse/ consolidation with bilateral pleural effusions. Old bilateral rib fractures. IMPRESSION: Bilateral pleural effusions with left lower lobe collapse/ consolidation. Electronically Signed   By: Leanna Battles M.D.   On: 09/09/2016 17:29   Dg Chest Portable 1 View  Result Date: 09/03/2016 CLINICAL DATA:  AMS/cad/chf/htn/smoker/hx heart cath/by pass surgery EXAM: PORTABLE CHEST 1 VIEW COMPARISON:  12/02/2015 FINDINGS: Shallow lung inflation. Heart size is probably normal. No focal consolidations or pleural effusions. Remote rib fractures. IMPRESSION: No evidence for acute  abnormality. Electronically Signed   By: Norva Pavlov M.D.   On: 09/03/2016 11:26    PHYSICAL EXAM General: NAD Neck: No JVD, no thyromegaly or thyroid nodule.  Lungs: Clear to auscultation bilaterally with normal respiratory effort. CV: Nondisplaced PMI.  Heart regular S1/S2, no S3/S4, no murmur.  No peripheral edema.  No carotid bruit.  Normal pedal pulses.  Abdomen: Soft, nontender, no hepatosplenomegaly, no distention.  Neurologic: Alert and oriented x 3.  Psych: Normal affect. Extremities: No clubbing or cyanosis.   TELEMETRY: Reviewed telemetry pt in NSR  ASSESSMENT AND PLAN:  70 yo with CAD s/p CABG, ongoing ETOH abuse, cardiomyopathy, possible LV thrombus presented with DKA, altered mental status.  Echo was done, EF down to 25-30%.  LHC was done showing no culprit. 1. CAD: Elevated troponin in setting of DKA.  Cardiac cath showed no new lesions to explain NSTEMI or fall in EF.  Anatomy similar to the past.  Suspect demand ischemia.  - Continue ASA, statin.  Will check lipids in am.  2. Acute systolic CHF: EF 96-22% on echo, newly fallen.  Coronary angiography did not show any changes to explain this fall.  Suspect mixed ischemic/nonischemic cardiomyopathy.  May be a component of ETOH cardiomyopathy with  heavy drinking. Also, cannot rule out stress cardiomyopathy in setting of medical illness (DKA).  He is not volume overloaded on exam.  - Continue Coreg.  - Add spironolactone 12.5 daily and lisinopril 2.5 daily.   - Does not need Lasix right now.  3. Possible LV thrombus: Need to confirm with limited echo with Definity.  For the time being, he is on heparin gtt.  If has thrombus, needs warfarin.   [name] 09/12/2016 1:59 PM

## 2016-09-12 NOTE — Progress Notes (Signed)
  Echocardiogram 2D Echocardiogram limited with Definity has been performed.  Aaron Santiago 09/12/2016, 4:29 PM

## 2016-09-13 DIAGNOSIS — I236 Thrombosis of atrium, auricular appendage, and ventricle as current complications following acute myocardial infarction: Secondary | ICD-10-CM

## 2016-09-13 DIAGNOSIS — I420 Dilated cardiomyopathy: Secondary | ICD-10-CM

## 2016-09-13 DIAGNOSIS — I2129 ST elevation (STEMI) myocardial infarction involving other sites: Secondary | ICD-10-CM

## 2016-09-13 LAB — CBC
HCT: 26.9 % — ABNORMAL LOW (ref 39.0–52.0)
Hemoglobin: 9.1 g/dL — ABNORMAL LOW (ref 13.0–17.0)
MCH: 27.4 pg (ref 26.0–34.0)
MCHC: 33.8 g/dL (ref 30.0–36.0)
MCV: 81 fL (ref 78.0–100.0)
PLATELETS: 437 10*3/uL — AB (ref 150–400)
RBC: 3.32 MIL/uL — AB (ref 4.22–5.81)
RDW: 15.3 % (ref 11.5–15.5)
WBC: 7 10*3/uL (ref 4.0–10.5)

## 2016-09-13 LAB — BASIC METABOLIC PANEL
ANION GAP: 10 (ref 5–15)
BUN: 14 mg/dL (ref 6–20)
CALCIUM: 8.1 mg/dL — AB (ref 8.9–10.3)
CO2: 24 mmol/L (ref 22–32)
CREATININE: 1.34 mg/dL — AB (ref 0.61–1.24)
Chloride: 101 mmol/L (ref 101–111)
GFR, EST NON AFRICAN AMERICAN: 52 mL/min — AB (ref >60)
GLUCOSE: 329 mg/dL — AB (ref 65–99)
Potassium: 3.9 mmol/L (ref 3.5–5.1)
Sodium: 135 mmol/L (ref 135–145)

## 2016-09-13 LAB — GLUCOSE, CAPILLARY
GLUCOSE-CAPILLARY: 378 mg/dL — AB (ref 65–99)
Glucose-Capillary: 257 mg/dL — ABNORMAL HIGH (ref 65–99)
Glucose-Capillary: 198 mg/dL — ABNORMAL HIGH (ref 65–99)
Glucose-Capillary: 121 mg/dL — ABNORMAL HIGH (ref 65–99)

## 2016-09-13 LAB — HEPARIN LEVEL (UNFRACTIONATED)
HEPARIN UNFRACTIONATED: 0.29 [IU]/mL — AB (ref 0.30–0.70)
HEPARIN UNFRACTIONATED: 0.35 [IU]/mL (ref 0.30–0.70)

## 2016-09-13 LAB — HEPATIC FUNCTION PANEL
ALK PHOS: 125 U/L (ref 38–126)
ALT: 31 U/L (ref 17–63)
AST: 32 U/L (ref 15–41)
Albumin: 2 g/dL — ABNORMAL LOW (ref 3.5–5.0)
Bilirubin, Direct: 0.1 mg/dL (ref 0.1–0.5)
Indirect Bilirubin: 0.5 mg/dL (ref 0.3–0.9)
TOTAL PROTEIN: 4.7 g/dL — AB (ref 6.5–8.1)
Total Bilirubin: 0.6 mg/dL (ref 0.3–1.2)

## 2016-09-13 LAB — LIPASE, BLOOD: Lipase: 12 U/L (ref 11–51)

## 2016-09-13 LAB — LIPID PANEL
Cholesterol: 87 mg/dL (ref 0–200)
HDL: 31 mg/dL — ABNORMAL LOW (ref >40)
LDL CALC: 44 mg/dL (ref 0–99)
TRIGLYCERIDES: 62 mg/dL (ref <150)
Total CHOL/HDL Ratio: 2.8 RATIO
VLDL: 12 mg/dL (ref 0–40)

## 2016-09-13 MED ORDER — WARFARIN - PHARMACIST DOSING INPATIENT
Freq: Every day | Status: DC
  Administered 2016-09-13: 18:00:00

## 2016-09-13 MED ORDER — OXYCODONE-ACETAMINOPHEN 5-325 MG PO TABS
1.0000 | ORAL_TABLET | ORAL | Status: DC | PRN
Start: 2016-09-13 — End: 2016-09-14
  Administered 2016-09-13 – 2016-09-14 (×4): 1 via ORAL
  Filled 2016-09-13 (×4): qty 1

## 2016-09-13 MED ORDER — GABAPENTIN 400 MG PO CAPS
400.0000 mg | ORAL_CAPSULE | Freq: Two times a day (BID) | ORAL | Status: DC
  Administered 2016-09-13 – 2016-09-14 (×3): 400 mg via ORAL
  Filled 2016-09-13 (×3): qty 1

## 2016-09-13 MED ORDER — POLYETHYLENE GLYCOL 3350 17 G PO PACK
17.0000 g | PACK | Freq: Every day | ORAL | Status: DC
  Administered 2016-09-14: 17 g via ORAL
  Filled 2016-09-13: qty 1

## 2016-09-13 MED ORDER — WARFARIN SODIUM 4 MG PO TABS
4.0000 mg | ORAL_TABLET | Freq: Once | ORAL | Status: AC
  Administered 2016-09-13: 4 mg via ORAL
  Filled 2016-09-13: qty 1

## 2016-09-13 MED ORDER — ENSURE ENLIVE PO LIQD
237.0000 mL | Freq: Two times a day (BID) | ORAL | Status: DC
  Administered 2016-09-13 – 2016-09-14 (×3): 237 mL via ORAL

## 2016-09-13 MED ORDER — GABAPENTIN 400 MG PO CAPS
800.0000 mg | ORAL_CAPSULE | Freq: Every day | ORAL | Status: DC
  Administered 2016-09-13: 800 mg via ORAL
  Filled 2016-09-13: qty 2

## 2016-09-13 MED ORDER — SENNOSIDES-DOCUSATE SODIUM 8.6-50 MG PO TABS
1.0000 | ORAL_TABLET | Freq: Two times a day (BID) | ORAL | Status: DC
  Administered 2016-09-13 – 2016-09-14 (×2): 1 via ORAL
  Filled 2016-09-13 (×2): qty 1

## 2016-09-13 MED ORDER — METHOCARBAMOL 500 MG PO TABS
500.0000 mg | ORAL_TABLET | Freq: Three times a day (TID) | ORAL | Status: DC
  Administered 2016-09-13 – 2016-09-14 (×4): 500 mg via ORAL
  Filled 2016-09-13 (×4): qty 1

## 2016-09-13 MED ORDER — ASPIRIN EC 81 MG PO TBEC
81.0000 mg | DELAYED_RELEASE_TABLET | Freq: Every day | ORAL | Status: DC
  Administered 2016-09-14: 81 mg via ORAL
  Filled 2016-09-13: qty 1

## 2016-09-13 NOTE — Progress Notes (Signed)
ANTICOAGULATION CONSULT NOTE - Follow Up Consult  Pharmacy Consult for Heparin Indication: LV thrombus  No Known Allergies  Patient Measurements: Height: 6' (182.9 cm) Weight: 158 lb (71.7 kg) IBW/kg (Calculated) : 77.6 Heparin Dosing Weight:   Vital Signs: Temp: 97.8 F (36.6 C) (10/15 1442) Temp Source: Oral (10/15 1442) BP: 131/62 (10/15 1442) Pulse Rate: 87 (10/15 1442)  Labs:  Recent Labs  09/11/16 0552  09/12/16 0819 09/13/16 0656 09/13/16 1521  HGB 9.4*  --  8.9* 9.1*  --   HCT 28.1*  --  26.6* 26.9*  --   PLT 388  --  464* 437*  --   HEPARINUNFRC 0.43  < > 0.40 0.29* 0.35  CREATININE 1.39*  --  1.27* 1.34*  --   < > = values in this interval not displayed.  Estimated Creatinine Clearance: 52.8 mL/min (by C-G formula based on SCr of 1.34 mg/dL (H)).   Assessment:  Now on heparin for likely LV thrombus, starting warfarin  Heparin level subtherapeutic, 0.29, after being therapeutic x several days on 1000 u/hr. HL now 0.35 ok.  Goal of Therapy:  Heparin level 0.3-0.7 units/ml Monitor platelets by anticoagulation protocol: Yes   Plan:  Continue IV heparin at 1100 units/hr HL and CBC in AM  Merilynn Finland, [employer] 09/13/2016,5:24 PM

## 2016-09-13 NOTE — Progress Notes (Addendum)
[facility] Progress Note  Patient: Aaron CrumblyJerry P Ellerby ZOX:[id number]   PCP: Josue HectorNYLAND,LEONARD ROBERT, MD DOB: Nov 25, 1946   DOA: 09/03/2016   DOS: 09/13/2016   Date of Service: the patient was seen and examined on 09/13/2016  Brief hospital course: Pt. with PMH of DM2, HLD, CAD S/P CABG 2003 & S/P PCI to the proximal RCA (Resolute DES 3.0 mm x 15 mm) and mid Circumflex (Promus DES 2.5 mm x 28 mm) in 2012, Chronic Diastolic CHF, HTN, Substance abuse, and EtOH abuse who had been non-compliant with meds (reportedly stopped taking 1 month ago). [facility] via EMS due to AMS. He has a heavy ETOH hx but stopped drinking 3 days prior to presentation.  In ED, labs were suggestive of DKA vs HONK. He was felt to be unstable and was transferred [facility] ICU for further evaluation and management. On admission UDSnegative, EtOH level negative Currently further plan is Continue treatment for constipation and pain management.  Assessment and Plan: 1. DKA, uncontrolled type 2 diabetes mellitus. Currently resolved. Anion gap is closed. Continue current insulin. CBG actually on a lower side.  2. Coronary artery disease, non-STEMI. Chronic combined CHF systolic and diastolic. Newly appreciated at left ventricle thrombus. On IV heparin, cardiology following., I will initiate warfarin. If cardiology is okay I will transition the patient to Lovenox starting 09/14/2016. S/P cardiac catheterization without any occlusion. Blood pressure well controlled. Continue to monitor. No evidence of volume overload as well. LDL 44, continuing only pravastatin because of that.  3. Abdominal pain in the right groin. Patient complains about) abdominal pain. X-ray shows constipation. We'll start the patient on stool softener. Patient did have 1 large BM today, changing MiraLAX from twice a day to daily, Senokot from 2 tablets twice a day to one tablet twice a day. Patient's pain appears to be  musculoskeletal and therefore I will change is OxyIR to Percocet 5 mg to reduce the dose. Also add Robaxin scheduled. Continue tramadol. Resuming home gabapentin dose.  4. Bilateral pleural effusion. Repeat chest x-ray shows small pleural effusion otherwise clear lungs. Significant improvement over earlier study.  5. Acute kidney injury. Improving. Monitor.  6. Alcohol abuse. No evidence of withdrawal at present. As per report from the stepdown unit, the patient drinks heavily on daily basis. Monitor closely.  Pain management: On OxyIR 5-10 mg every 4 hours, I would reduce to Percocet 5 mg every 4 hours. Add Robaxin. Continue when necessary tramadol every 6 hours. Activity: SNF physical therapy Bowel regimen: last BM 09/10/2016 Diet: Cardiac diet DVT Prophylaxis: subcutaneous Heparin  Advance goals of care discussion: Full code  Family Communication: no family was present at bedside, at the time of interview.   Disposition:  Discharge to SNF. Expected discharge date: 09/14/2016  Consultants: Cardiology Procedures: Echocardiogram  Antibiotics: Anti-infectives    Start     Dose/Rate Route Frequency Ordered Stop   09/03/16 1045  vancomycin (VANCOCIN) IVPB 1000 mg/200 mL premix     1,000 mg 200 mL/hr over 60 Minutes Intravenous  Once 09/03/16 1040 09/03/16 1247   09/03/16 1045  piperacillin-tazobactam (ZOSYN) IVPB 3.375 g     3.375 g 100 mL/hr over 30 Minutes Intravenous  Once 09/03/16 1040 09/03/16 1158      Subjective: Continues to have right groin pain, no nausea no vomiting, cc fatigue. No Chest pain.  Objective: Physical Exam: Vitals:   09/12/16 1410 09/12/16 2203 09/13/16 0323 09/13/16 1036  BP: (!) 105/55 129/61 (!) 147/87 Marland Kitchen(!)  118/49  Pulse: 84 87 92 87  Resp: 17 20 20 18   Temp: 98.4 F (36.9 C) 99 F (37.2 C) 98.3 F (36.8 C) 97.6 F (36.4 C)  TempSrc: Oral Oral Oral Oral  SpO2: 97% 97% 96% 96%  Weight:      Height:        Intake/Output Summary  (Last 24 hours) at 09/13/16 1424 Last data filed at 09/13/16 0456  Gross per 24 hour  Intake           349.33 ml  Output              400 ml  Net           -50.67 ml   Filed Weights   09/08/16 0000 09/09/16 0400 09/10/16 1735  Weight: 72.6 kg (160 lb 0.9 oz) 74.7 kg (164 lb 10.9 oz) 71.7 kg (158 lb)    General: Alert, Awake and Oriented to Time, Place and Person. Appear in mild distress, affect appropriate Eyes: PERRL, Conjunctiva normal ENT: Oral Mucosa clear moist. Neck: no JVD, no Abnormal Mass Or lumps Cardiovascular: S1 and S2 Present, no Murmur, Respiratory: Bilateral Air entry equal and Decreased, jno use of accessory muscle, Clear to Auscultation, no Crackles, no wheezes Abdomen: Bowel Sound present, Soft and no tenderness Skin: no redness, no Rash, no induration Extremities: no Pedal edema, no calf tenderness Neurologic: Grossly no focal neuro deficit. Bilaterally Equal motor strength  Data Reviewed: CBC:  Recent Labs Lab 09/08/16 0425 09/09/16 0400 09/10/16 0345 09/11/16 0552 09/12/16 0819 09/13/16 0656  WBC 4.2 3.9* 5.0 6.7 7.4 7.0  NEUTROABS 2.9 2.2 3.1 4.5  --   --   HGB 9.7* 9.7* 10.1* 9.4* 8.9* 9.1*  HCT 28.5* 28.5* 29.7* 28.1* 26.6* 26.9*  MCV 81.2 80.5 81.4 81.4 81.6 81.0  PLT 197 273 342 388 464* 437*   Basic Metabolic Panel:  Recent Labs Lab 09/07/16 1131 09/08/16 0425 09/09/16 0400 09/10/16 0345 09/11/16 0552 09/12/16 0819 09/13/16 0656  NA 139 139 138 138 139 139 135  K 3.3* 3.6 3.4* 3.9 3.9 3.3* 3.9  CL 102 101 103 103 102 100* 101  CO2 30 28 28 23 26 30 24   GLUCOSE 121* 168* 147* 154* 171* 248* 329*  BUN 28* 24* 24* 26* 23* 16 14  CREATININE 1.59* 1.45* 1.35* 1.46* 1.39* 1.27* 1.34*  CALCIUM 8.0* 7.9* 7.9* 7.9* 8.3* 8.0* 8.1*  MG 1.5* 1.9 1.8 1.5* 1.9 1.5*  --   PHOS 2.9 3.6 3.7 3.7 3.1  --   --     Liver Function Tests:  Recent Labs Lab 09/09/16 0400 09/10/16 0345 09/11/16 0552 09/12/16 0819 09/13/16 0656  AST  --   --    --  29 32  ALT  --   --   --  27 31  ALKPHOS  --   --   --  122 125  BILITOT  --   --   --  0.5 0.6  PROT  --   --   --  4.5* 4.7*  ALBUMIN 1.5* 1.5* 2.4* 1.9* 2.0*    Recent Labs Lab 09/13/16 0656  LIPASE 12   No results for input(s): AMMONIA in the last 168 hours. Coagulation Profile:  Recent Labs Lab 09/08/16 0425  INR 0.95   Cardiac Enzymes:  Recent Labs Lab 09/07/16 1131 09/07/16 1828 09/07/16 2331  TROPONINI 2.82* 2.55* 1.91*   BNP (last 3 results) No results for input(s): PROBNP in the last 8760 hours.  CBG:  Recent Labs Lab 09/12/16 1137 09/12/16 1804 09/12/16 2012 09/13/16 0839 09/13/16 1153  GLUCAP 147* 64* 203* 257* 198*    Studies: Dg Abd Acute W/chest  Result Date: 09/12/2016 CLINICAL DATA:  CP and SOB x2-3 months. RLQ pain. Lung collapse. Med Hx: Diabetes Hypertension Peptic Ulcer disease Hyperlipidemia GERD CAD CHF Carotid Artery Stenosis Surg Hx: Cardiac Cath 2017 Coronary Artery Bypass Graft (CABG) 2002 Stent placement 2012 EXAM: DG ABDOMEN ACUTE W/ 1V CHEST COMPARISON:  Chest x-rays dated 09/09/2016 and 09/03/2016. Comparison also made to a CT abdomen dated 09/27/2015. FINDINGS: Single view of the chest: Study is hypoinspiratory with crowding of the perihilar and bibasilar bronchovascular markings. Suspect mild atelectasis and/or small pleural effusions at each lung base. Given the low lung volumes, lungs appear otherwise clear. Heart size and mediastinal contours are normal. Median sternotomy wires are stable in alignment, related to previous CABG. Supine and upright views of the abdomen: Bowel gas pattern is nonobstructive. Fairly large amount of stool noted in the right colon. No evidence of soft tissue mass or abnormal fluid collection seen. No evidence of free intraperitoneal air. Surgical sutures noted within the upper abdomen. Mild degenerative change noted within the slightly scoliotic thoracolumbar spine. No acute or suspicious osseous  finding. IMPRESSION: 1. Low lung volumes. Probable mild bibasilar atelectasis and/or small pleural effusions. Lungs otherwise clear. Heart size is normal. Surgical changes of previous CABG. 2. Nonobstructive bowel gas pattern. Fairly large amount of stool in the right colon (constipation? ). Electronically Signed   By: Bary Richard M.D.   On: 09/12/2016 15:47     Scheduled Meds: . [START ON 09/14/2016] aspirin EC  81 mg Oral Daily  . carvedilol  6.25 mg Oral BID WC  . escitalopram  20 mg Oral Daily  . feeding supplement (ENSURE ENLIVE)  237 mL Oral BID BM  . folic acid  1 mg Oral Daily  . gabapentin  300 mg Oral TID  . insulin aspart  0-9 Units Subcutaneous TID WC  . insulin detemir  10 Units Subcutaneous Q24H  . lisinopril  2.5 mg Oral Daily  . methocarbamol  500 mg Oral TID  . multivitamin with minerals  1 tablet Oral Daily  . pantoprazole  40 mg Oral Daily  . polyethylene glycol  17 g Oral BID  . pravastatin  20 mg Oral q1800  . senna-docusate  2 tablet Oral BID  . spironolactone  12.5 mg Oral Daily  . sucralfate  1 g Oral TID AC  . thiamine  100 mg Oral Daily   Or  . thiamine  100 mg Intravenous Daily  . warfarin  4 mg Oral ONCE-1800  . Warfarin - Pharmacist Dosing Inpatient   Does not apply q1800   Continuous Infusions: . sodium chloride 250 mL (09/11/16 2215)  . heparin 1,100 Units/hr (09/13/16 0846)   PRN Meds: acetaminophen, LORazepam, ondansetron (ZOFRAN) IV, oxyCODONE-acetaminophen, sodium chloride flush, traMADol  Time spent: 30 minutes  Author: Lynden Oxford, MD [facility] Pager: [phone] 09/13/2016 2:24 PM  If 7PM-7AM, please contact night-coverage at [email or web address], password [email or web address]

## 2016-09-13 NOTE — Progress Notes (Signed)
Patient ID: Aaron Santiago, male   DOB: [birth year], 70 y.o.   MRN: [id number]   SUBJECTIVE: Complains of right flank pain.  No dyspnea or chest pain.   Scheduled Meds: . aspirin  81 mg Oral Daily  . carvedilol  6.25 mg Oral BID WC  . escitalopram  20 mg Oral Daily  . feeding supplement (ENSURE ENLIVE)  237 mL Oral BID BM  . folic acid  1 mg Oral Daily  . gabapentin  300 mg Oral TID  . insulin aspart  0-9 Units Subcutaneous TID WC  . insulin detemir  10 Units Subcutaneous Q24H  . lisinopril  2.5 mg Oral Daily  . multivitamin with minerals  1 tablet Oral Daily  . pantoprazole  40 mg Oral Daily  . polyethylene glycol  17 g Oral BID  . pravastatin  20 mg Oral q1800  . senna-docusate  2 tablet Oral BID  . spironolactone  12.5 mg Oral Daily  . sucralfate  1 g Oral TID AC  . thiamine  100 mg Oral Daily   Or  . thiamine  100 mg Intravenous Daily  . warfarin  4 mg Oral ONCE-1800  . Warfarin - Pharmacist Dosing Inpatient   Does not apply q1800   Continuous Infusions: . sodium chloride 250 mL (09/11/16 2215)  . heparin 1,100 Units/hr (09/13/16 0846)   PRN Meds:.acetaminophen, LORazepam, ondansetron (ZOFRAN) IV, oxyCODONE, sodium chloride flush, traMADol    Vitals:   09/12/16 0500 09/12/16 1410 09/12/16 2203 09/13/16 0323  BP: 104/76 (!) 105/55 129/61 (!) 147/87  Pulse: 94 84 87 92  Resp: 18 17 20 20   Temp: 98.4 F (36.9 C) 98.4 F (36.9 C) 99 F (37.2 C) 98.3 F (36.8 C)  TempSrc: Oral Oral Oral Oral  SpO2: 94% 97% 97% 96%  Weight:      Height:        Intake/Output Summary (Last 24 hours) at 09/13/16 1022 Last data filed at 09/13/16 0456  Gross per 24 hour  Intake           349.33 ml  Output              400 ml  Net           -50.67 ml    LABS: Basic Metabolic Panel:  Recent Labs  13/08/65 0552 09/12/16 0819 09/13/16 0656  NA 139 139 135  K 3.9 3.3* 3.9  CL 102 100* 101  CO2 26 30 24   GLUCOSE 171* 248* 329*  BUN 23* 16 14  CREATININE 1.39* 1.27* 1.34*    CALCIUM 8.3* 8.0* 8.1*  MG 1.9 1.5*  --   PHOS 3.1  --   --    Liver Function Tests:  Recent Labs  09/12/16 0819 09/13/16 0656  AST 29 32  ALT 27 31  ALKPHOS 122 125  BILITOT 0.5 0.6  PROT 4.5* 4.7*  ALBUMIN 1.9* 2.0*    Recent Labs  09/13/16 0656  LIPASE 12   CBC:  Recent Labs  09/11/16 0552 09/12/16 0819 09/13/16 0656  WBC 6.7 7.4 7.0  NEUTROABS 4.5  --   --   HGB 9.4* 8.9* 9.1*  HCT 28.1* 26.6* 26.9*  MCV 81.4 81.6 81.0  PLT 388 464* 437*   Cardiac Enzymes: No results for input(s): CKTOTAL, CKMB, CKMBINDEX, TROPONINI in the last 72 hours. BNP: Invalid input(s): POCBNP D-Dimer: No results for input(s): DDIMER in the last 72 hours. Hemoglobin A1C: No results for input(s): HGBA1C in the last  72 hours. Fasting Lipid Panel:  Recent Labs  09/13/16 0656  CHOL 87  HDL 31*  LDLCALC 44  TRIG 62  CHOLHDL 2.8   Thyroid Function Tests: No results for input(s): TSH, T4TOTAL, T3FREE, THYROIDAB in the last 72 hours.  Invalid input(s): FREET3 Anemia Panel: No results for input(s): VITAMINB12, FOLATE, FERRITIN, TIBC, IRON, RETICCTPCT in the last 72 hours.  RADIOLOGY: Ct Head Wo Contrast  Result Date: 08/14/2016 CLINICAL DATA:  Altered mental status EXAM: CT HEAD WITHOUT CONTRAST TECHNIQUE: Contiguous axial images were obtained from the base of the skull through the vertex without intravenous contrast. COMPARISON:  None. FINDINGS: Brain: There is atrophy and chronic small vessel disease changes. No acute intracranial abnormality. Specifically, no hemorrhage, hydrocephalus, mass lesion, acute infarction, or significant intracranial injury. Vascular: No hyperdense vessel or unexpected calcification. Skull: No acute calvarial abnormality. Sinuses/Orbits: Mucosal thickening throughout the paranasal sinuses. No 12 levels. Orbital soft tissues unremarkable. Other: None IMPRESSION: No acute intracranial abnormality. Mild chronic sinusitis. Electronically Signed   By:  Charlett NoseKevin  Dover M.D.   On: 08/14/2016 17:09   Dg Chest Port 1 View  Result Date: 09/09/2016 CLINICAL DATA:  Right rib pain for months.  No injury. EXAM: PORTABLE CHEST 1 VIEW COMPARISON:  09/03/2016. FINDINGS: Trachea is midline. Heart size stable. Thoracic aorta is calcified. There is left lower lobe collapse/ consolidation with bilateral pleural effusions. Old bilateral rib fractures. IMPRESSION: Bilateral pleural effusions with left lower lobe collapse/ consolidation. Electronically Signed   By: Leanna BattlesMelinda  Blietz M.D.   On: 09/09/2016 17:29   Dg Chest Portable 1 View  Result Date: 09/03/2016 CLINICAL DATA:  AMS/cad/chf/htn/smoker/hx heart cath/by pass surgery EXAM: PORTABLE CHEST 1 VIEW COMPARISON:  12/02/2015 FINDINGS: Shallow lung inflation. Heart size is probably normal. No focal consolidations or pleural effusions. Remote rib fractures. IMPRESSION: No evidence for acute  abnormality. Electronically Signed   By: Norva PavlovElizabeth  Brown M.D.   On: 09/03/2016 11:26   Dg Abd Acute W/chest  Result Date: 09/12/2016 CLINICAL DATA:  CP and SOB x2-3 months. RLQ pain. Lung collapse. Med Hx: Diabetes Hypertension Peptic Ulcer disease Hyperlipidemia GERD CAD CHF Carotid Artery Stenosis Surg Hx: Cardiac Cath 2017 Coronary Artery Bypass Graft (CABG) 2002 Stent placement 2012 EXAM: DG ABDOMEN ACUTE W/ 1V CHEST COMPARISON:  Chest x-rays dated 09/09/2016 and 09/03/2016. Comparison also made to a CT abdomen dated 09/27/2015. FINDINGS: Single view of the chest: Study is hypoinspiratory with crowding of the perihilar and bibasilar bronchovascular markings. Suspect mild atelectasis and/or small pleural effusions at each lung base. Given the low lung volumes, lungs appear otherwise clear. Heart size and mediastinal contours are normal. Median sternotomy wires are stable in alignment, related to previous CABG. Supine and upright views of the abdomen: Bowel gas pattern is nonobstructive. Fairly large amount of stool noted in the  right colon. No evidence of soft tissue mass or abnormal fluid collection seen. No evidence of free intraperitoneal air. Surgical sutures noted within the upper abdomen. Mild degenerative change noted within the slightly scoliotic thoracolumbar spine. No acute or suspicious osseous finding. IMPRESSION: 1. Low lung volumes. Probable mild bibasilar atelectasis and/or small pleural effusions. Lungs otherwise clear. Heart size is normal. Surgical changes of previous CABG. 2. Nonobstructive bowel gas pattern. Fairly large amount of stool in the right colon (constipation? ). Electronically Signed   By: Bary RichardStan  Maynard M.D.   On: 09/12/2016 15:47    PHYSICAL EXAM General: NAD Neck: No JVD, no thyromegaly or thyroid nodule.  Lungs: Clear to auscultation bilaterally  with normal respiratory effort. CV: Nondisplaced PMI.  Heart regular S1/S2, no S3/S4, no murmur.  No peripheral edema.  No carotid bruit.  Normal pedal pulses.  Abdomen: Soft, nontender, no hepatosplenomegaly, no distention.  Neurologic: Alert and oriented x 3.  Psych: Normal affect. Extremities: No clubbing or cyanosis.   TELEMETRY: Reviewed telemetry pt in NSR,occ PVC  ASSESSMENT AND PLAN:  70 yo with CAD s/p CABG, ongoing ETOH abuse, cardiomyopathy, possible LV thrombus presented with DKA, altered mental status.  Echo was done, EF down to 25-30%.  LHC was done showing no culprit. 1. CAD: Elevated troponin in setting of DKA.  Cardiac cath showed no new lesions to explain NSTEMI or fall in EF.  Anatomy similar to the past.  Suspect demand ischemia.  - Continue ASA, statin.  Lipids are under good control. 2. Acute systolic CHF: EF 63-33% on echo, newly fallen.  Coronary angiography did not show any changes to explain this fall.  Suspect mixed ischemic/nonischemic cardiomyopathy.  May be a component of ETOH cardiomyopathy with heavy drinking. Also, cannot rule out stress cardiomyopathy in setting of medical illness (DKA).  He is not volume  overloaded on exam.  - Continue Coreg.  - Add spironolactone 12.5 daily and lisinopril 2.5 daily.   - Does not need Lasix right now.  3.  LV thrombus: Limited echo with Definity suggests an apical mass.  He is on heparin gtt. Would recommend anticoagulation with coumadin.  Aldin Drees [place] 09/13/2016 10:22 AM

## 2016-09-13 NOTE — Progress Notes (Addendum)
ANTICOAGULATION CONSULT NOTE Pharmacy Consult for Heparin and warfarin Indication:  LV thrombus  No Known Allergies  Patient Measurements: Height: 6' (182.9 cm) Weight: 158 lb (71.7 kg) IBW/kg (Calculated) : 77.6   Vital Signs: Temp: 98.3 F (36.8 C) (10/15 0323) Temp Source: Oral (10/15 0323) BP: 147/87 (10/15 0323) Pulse Rate: 92 (10/15 0323)  Labs:  Recent Labs  09/11/16 0552 09/11/16 1722 09/12/16 0819 09/13/16 0656  HGB 9.4*  --  8.9* 9.1*  HCT 28.1*  --  26.6* 26.9*  PLT 388  --  464* 437*  HEPARINUNFRC 0.43 0.53 0.40 0.29*  CREATININE 1.39*  --  1.27* 1.34*    Estimated Creatinine Clearance: 52.8 mL/min (by C-G formula based on SCr of 1.34 mg/dL (H)).  Assessment: 70 y.o. male with LV thrombus for heparin.  Heparin level subtherapeutic today at 0.29 after several days of being therapeutic. PLTC and Hgb stable. No bleeding noted. No interruptions in infusion per nursing.  Goal of Therapy:  Heparin level 0.3-0.7 units/ml Monitor platelets by anticoagulation protocol: Yes   Plan:  Increase Heparin to rate of 1100 units/hr F/U plan for PO anticoagulation Confirmatory heparin level at 1500 Heparin level, CBC daily Monitor for s/sx bleeding  Allena Katz, Pharm.D. PGY1 Pharmacy Resident 10/15/20178:51 AM Pager [phone]  __________________________________ Addendum 09/13/2016 9:53 AM  Pharmacy consulted to dose warfarin.  AST/LFT and TBili wnl; however, Albumin 1.9>2.0 this admission +h/o EtOH abuse  Start warfarin at 4 mg PO x1 tonight INR daily Monitor for s/sx bleeding, DDI, PO intake  Allena Katz, Pharm.D. PGY1 Pharmacy Resident 10/15/20179:53 AM Pager [phone]

## 2016-09-13 NOTE — Discharge Instructions (Signed)
Information on my medicine - Coumadin   (Warfarin)  This medication education was reviewed with me or my healthcare representative as part of my discharge preparation.  The pharmacist that spoke with me during my hospital stay was:  Allena Katz, [facility]  Why was Coumadin prescribed for you? Coumadin was prescribed for you because you have a blood clot or a medical condition that can cause an increased risk of forming blood clots. Blood clots can cause serious health problems by blocking the flow of blood to the heart, lung, or brain. Coumadin can prevent harmful blood clots from forming. As a reminder your indication for Coumadin is:   Select from menu  What test will check on my response to Coumadin? While on Coumadin (warfarin) you will need to have an INR test regularly to ensure that your dose is keeping you in the desired range. The INR (international normalized ratio) number is calculated from the result of the laboratory test called prothrombin time (PT).  If an INR APPOINTMENT HAS NOT ALREADY BEEN MADE FOR YOU please schedule an appointment to have this lab work done by your health care provider within 7 days. Your INR goal is usually a number between:  2 to 3 or your provider may give you a more narrow range like 2-2.5.  Ask your health care provider during an office visit what your goal INR is.  What  do you need to  know  About  COUMADIN? Take Coumadin (warfarin) exactly as prescribed by your healthcare provider about the same time each day.  DO NOT stop taking without talking to the doctor who prescribed the medication.  Stopping without other blood clot prevention medication to take the place of Coumadin may increase your risk of developing a new clot or stroke.  Get refills before you run out.  What do you do if you miss a dose? If you miss a dose, take it as soon as you remember on the same day then continue your regularly scheduled regimen the next day.  Do not take two doses of  Coumadin at the same time.  Important Safety Information A possible side effect of Coumadin (Warfarin) is an increased risk of bleeding. You should call your healthcare provider right away if you experience any of the following: ? Bleeding from an injury or your nose that does not stop. ? Unusual colored urine (red or dark brown) or unusual colored stools (red or black). ? Unusual bruising for unknown reasons. ? A serious fall or if you hit your head (even if there is no bleeding).  Some foods or medicines interact with Coumadin (warfarin) and might alter your response to warfarin. To help avoid this: ? Eat a balanced diet, maintaining a consistent amount of Vitamin K. ? Notify your provider about major diet changes you plan to make. ? Avoid alcohol or limit your intake to 1 drink for women and 2 drinks for men per day. (1 drink is 5 oz. wine, 12 oz. beer, or 1.5 oz. liquor.)  Make sure that ANY health care provider who prescribes medication for you knows that you are taking Coumadin (warfarin).  Also make sure the healthcare provider who is monitoring your Coumadin knows when you have started a new medication including herbals and non-prescription products.  Coumadin (Warfarin)  Major Drug Interactions  Increased Warfarin Effect Decreased Warfarin Effect  Alcohol (large quantities) Antibiotics (esp. Septra/Bactrim, Flagyl, Cipro) Amiodarone (Cordarone) Aspirin (ASA) Cimetidine (Tagamet) Megestrol (Megace) NSAIDs (ibuprofen, naproxen, etc.) Piroxicam (  Feldene) °Propafenone (Rythmol SR) °Propranolol (Inderal) °Isoniazid (INH) °Posaconazole (Noxafil) Barbiturates (Phenobarbital) °Carbamazepine (Tegretol) °Chlordiazepoxide (Librium) °Cholestyramine (Questran) °Griseofulvin °Oral Contraceptives °Rifampin °Sucralfate (Carafate) °Vitamin K  ° °Coumadin® (Warfarin) Major Herbal Interactions  °Increased Warfarin Effect Decreased Warfarin Effect  °Garlic °Ginseng °Ginkgo biloba Coenzyme Q10 °Green  tea °St. John’s wort   ° °Coumadin® (Warfarin) FOOD Interactions  °Eat a consistent number of servings per week of foods HIGH in Vitamin K °(1 serving = ½ cup)  °Collards (cooked, or boiled & drained) °Kale (cooked, or boiled & drained) °Mustard greens (cooked, or boiled & drained) °Parsley *serving size only = ¼ cup °Spinach (cooked, or boiled & drained) °Swiss chard (cooked, or boiled & drained) °Turnip greens (cooked, or boiled & drained)  °Eat a consistent number of servings per week of foods MEDIUM-HIGH in Vitamin K °(1 serving = 1 cup)  °Asparagus (cooked, or boiled & drained) °Broccoli (cooked, boiled & drained, or raw & chopped) °Brussel sprouts (cooked, or boiled & drained) *serving size only = ½ cup °Lettuce, raw (green leaf, endive, romaine) °Spinach, raw °Turnip greens, raw & chopped  ° °These websites have more information on Coumadin (warfarin):  www.coumadin.com; °www.ahrq.gov/consumer/coumadin.htm; ° ° ° °

## 2016-09-14 DIAGNOSIS — I255 Ischemic cardiomyopathy: Secondary | ICD-10-CM

## 2016-09-14 LAB — CBC
HCT: 29.2 % — ABNORMAL LOW (ref 39.0–52.0)
HEMOGLOBIN: 9.7 g/dL — AB (ref 13.0–17.0)
MCH: 27.3 pg (ref 26.0–34.0)
MCHC: 33.2 g/dL (ref 30.0–36.0)
MCV: 82.3 fL (ref 78.0–100.0)
PLATELETS: 502 10*3/uL — AB (ref 150–400)
RBC: 3.55 MIL/uL — ABNORMAL LOW (ref 4.22–5.81)
RDW: 15.7 % — ABNORMAL HIGH (ref 11.5–15.5)
WBC: 6.5 10*3/uL (ref 4.0–10.5)

## 2016-09-14 LAB — BASIC METABOLIC PANEL
ANION GAP: 10 (ref 5–15)
BUN: 11 mg/dL (ref 6–20)
CALCIUM: 8.2 mg/dL — AB (ref 8.9–10.3)
CO2: 26 mmol/L (ref 22–32)
CREATININE: 1.27 mg/dL — AB (ref 0.61–1.24)
Chloride: 99 mmol/L — ABNORMAL LOW (ref 101–111)
GFR, EST NON AFRICAN AMERICAN: 56 mL/min — AB (ref >60)
GLUCOSE: 271 mg/dL — AB (ref 65–99)
Potassium: 3.4 mmol/L — ABNORMAL LOW (ref 3.5–5.1)
Sodium: 135 mmol/L (ref 135–145)

## 2016-09-14 LAB — HEPARIN LEVEL (UNFRACTIONATED): HEPARIN UNFRACTIONATED: 0.51 [IU]/mL (ref 0.30–0.70)

## 2016-09-14 LAB — GLUCOSE, CAPILLARY
GLUCOSE-CAPILLARY: 241 mg/dL — AB (ref 65–99)
GLUCOSE-CAPILLARY: 210 mg/dL — AB (ref 65–99)
GLUCOSE-CAPILLARY: 141 mg/dL — AB (ref 65–99)

## 2016-09-14 LAB — PROTIME-INR
INR: 1.55
PROTHROMBIN TIME: 18.7 s — AB (ref 11.4–15.2)

## 2016-09-14 MED ORDER — ENOXAPARIN SODIUM 80 MG/0.8ML ~~LOC~~ SOLN
1.0000 mg/kg | Freq: Two times a day (BID) | SUBCUTANEOUS | Status: DC
  Administered 2016-09-14: 70 mg via SUBCUTANEOUS
  Filled 2016-09-14: qty 0.8

## 2016-09-14 MED ORDER — SPIRONOLACTONE 25 MG PO TABS: 12.5000 mg | ORAL_TABLET | Freq: Every day | ORAL | 0 refills | Status: AC

## 2016-09-14 MED ORDER — THIAMINE HCL 100 MG PO TABS: 100.0000 mg | ORAL_TABLET | Freq: Every day | ORAL | 0 refills | Status: AC

## 2016-09-14 MED ORDER — LISINOPRIL 2.5 MG PO TABS: 2.5000 mg | ORAL_TABLET | Freq: Every day | ORAL | 0 refills | Status: AC

## 2016-09-14 MED ORDER — POLYETHYLENE GLYCOL 3350 17 G PO PACK: 17.0000 g | PACK | Freq: Every day | ORAL | 0 refills | Status: AC

## 2016-09-14 MED ORDER — FOLIC ACID 1 MG PO TABS: 1.0000 mg | ORAL_TABLET | Freq: Every day | ORAL | 0 refills | Status: AC

## 2016-09-14 MED ORDER — ENOXAPARIN SODIUM 150 MG/ML ~~LOC~~ SOLN
1.0000 mg/kg | Freq: Two times a day (BID) | SUBCUTANEOUS | 0 refills | Status: DC
Start: 2016-09-14 — End: 2016-09-14

## 2016-09-14 MED ORDER — ENOXAPARIN SODIUM 80 MG/0.8ML ~~LOC~~ SOLN: 80.0000 mg | Freq: Two times a day (BID) | SUBCUTANEOUS | 0 refills | Status: AC

## 2016-09-14 MED ORDER — ENSURE ENLIVE PO LIQD: 237.0000 mL | Freq: Two times a day (BID) | ORAL | 12 refills | Status: AC

## 2016-09-14 MED ORDER — OXYCODONE-ACETAMINOPHEN 5-325 MG PO TABS: 1.0000 | ORAL_TABLET | ORAL | 0 refills | Status: AC | PRN

## 2016-09-14 MED ORDER — WARFARIN SODIUM 4 MG PO TABS: 4.0000 mg | ORAL_TABLET | Freq: Every day | ORAL | 0 refills | Status: AC

## 2016-09-14 MED ORDER — SENNOSIDES-DOCUSATE SODIUM 8.6-50 MG PO TABS: 1.0000 | ORAL_TABLET | Freq: Every evening | ORAL | 0 refills | Status: AC | PRN

## 2016-09-14 MED ORDER — POTASSIUM CHLORIDE CRYS ER 20 MEQ PO TBCR
40.0000 meq | EXTENDED_RELEASE_TABLET | Freq: Once | ORAL | Status: AC
  Administered 2016-09-14: 40 meq via ORAL
  Filled 2016-09-14: qty 2

## 2016-09-14 MED ORDER — INSULIN DETEMIR 100 UNIT/ML ~~LOC~~ SOLN: 10.0000 [IU] | Freq: Every day | SUBCUTANEOUS | 0 refills | Status: AC

## 2016-09-14 MED ORDER — WARFARIN SODIUM 4 MG PO TABS
4.0000 mg | ORAL_TABLET | Freq: Once | ORAL | Status: AC
  Administered 2016-09-14: 4 mg via ORAL
  Filled 2016-09-14: qty 1

## 2016-09-14 MED ORDER — PANTOPRAZOLE SODIUM 40 MG PO TBEC: 40.0000 mg | DELAYED_RELEASE_TABLET | Freq: Every day | ORAL | 0 refills | Status: DC

## 2016-09-14 MED ORDER — METHOCARBAMOL 500 MG PO TABS: 500.0000 mg | ORAL_TABLET | Freq: Three times a day (TID) | ORAL | 0 refills | Status: AC | PRN

## 2016-09-14 NOTE — Discharge Summary (Addendum)
[facility] Discharge Summary   Patient: Aaron Santiago YNW:[id number]   PCP: Josue Hector, MD DOB: [birth year]   Date of admission: 09/03/2016   Date of discharge:  09/14/2016    Discharge Diagnoses:  Active Problems:   DKA (diabetic ketoacidoses) (HCC)   AKI (acute kidney injury) (HCC)   Hypothermia   Severe dehydration   Elevated troponin   Acute encephalopathy   Abnormal EKG   Nonsustained ventricular tachycardia (HCC)   Demand myocardial infarction   Rib pain   Diabetic ketoacidosis with coma associated with type 2 diabetes mellitus (HCC)   Chronic diastolic CHF (congestive heart failure) (HCC)   Essential hypertension   ETOH abuse   Pleural effusion   Hyperchylomicronemia   Acute renal failure (HCC)   Dilated cardiomyopathy (HCC) - this is in the setting of acute illness and significant alcohol use.   Left ventricular thrombus following MI [facility])   Admitted From: Home Disposition:  SNF  Recommendations for Outpatient Follow-up:  1. Follow-up with PCP in one week. 2. Follow-up with cardiology in one month. 3. Get INR checked and continue Lovenox until INR is therapeutic  Follow-up Information    Josue Hector, MD. Schedule an appointment as soon as possible for a visit in 1 week(s).   Specialty:  Family Medicine Contact information: [address] [place] [address] [phone]        [facility] [employer]. Schedule an appointment as soon as possible for a visit in 3 week(s).   Specialty:  Cardiology Contact information: [address], [address] [place] [address] [phone]         Diet recommendation: Cardiac diet  Activity: The patient is advised to gradually reintroduce usual activities.  Discharge Condition: good  Code Status: Full code  History of present illness: As per the H and P dictated on admission, "Pt is encephelopathic; therefore, this HPI is obtained from chart  review. Aaron Santiago is a 70 y.o. male with PMH as outlined below including but not limited to DM for which he has been non-compliant with meds (reportedly stopped taking 1 month ago).  He presented to [facility] ED on 10/5 via EMS due to AMS.  He apparently lives with his sister who was the one who noticed his decreased / worsening level of consciousness.  He apparently has heavy ETOH hx but stopped drinking 3 days prior to presentation.  In ED, labs were suggestive of DKA vs HONK.  He was felt to be too unstable; therefore, was transferred to [facility] ICU for further evaluation and management. "  Hospital Course:  Summary of his active problems in the hospital is as following. 1. DKA, uncontrolled type 2 diabetes mellitus. Currently resolved. Anion gap is closed. Continue current insulin. CBG actually on a lower side.  2. Coronary artery disease, non-STEMI. Chronic combined CHF systolic and diastolic. Newly appreciated at left ventricle thrombus. On IV heparin, cardiology following., I will initiate warfarin. Discuss with cardiology, recommended to switch the patient to Lovenox. Vision will continue to get Lovenox in the nursing facility until his INR is therapeutic.  S/P cardiac catheterization without any occlusion. Blood pressure well controlled. Continue to monitor. No evidence of volume overload as well. LDL 44, continuing only pravastatin because of that.  3. Abdominal pain in the right groin. Constipation Patient complains about) abdominal pain. X-ray shows constipation. We'll start the patient on stool softener. Patient did have 1 large BM today, changing MiraLAX from twice a day to  daily, Senokot from 2 tablets twice a day to one tablet twice a day. Patient's pain appears to be musculoskeletal and therefore I will change is OxyIR to Percocet 5 mg to reduce the dose. Also add Robaxin scheduled. Continue tramadol. Resuming home gabapentin dose.  4. Bilateral pleural  effusion. Repeat chest x-ray shows small pleural effusion otherwise clear lungs. Significant improvement over earlier study.  5. Acute kidney injury. Improving. Monitor.  6. Alcohol abuse. No evidence of withdrawal at present. As per report from family member and friends were discussed with the attending in the stepdown unit, the patient drinks heavily on daily basis. Patient hospitalized for last 11 days, well passed beyond any time limit of withdrawal.  All other chronic medical condition were stable during the hospitalization.  Patient was seen by physical therapy, who recommended SNF, which was arranged by Child psychotherapist and case Production designer, theatre/television/film. On the day of the discharge the patient's vitals were stable, and no other acute medical condition were reported by patient. the patient was felt safe to be discharge at SNF with therapy.  Procedures and Results:  Echocardiogram  Study Conclusions  - Left ventricle: Systolic function was severely reduced. The   estimated ejection fraction was in the range of 25% to 30%. - Regional wall motion abnormality: Akinesis of the mid anterior,   mid anteroseptal, and apical septal myocardium; severe   hypokinesis of the apical anterior, basal anteroseptal, apical   inferior, mid anterolateral, apical lateral, and apical   myocardium. - Right ventricle: Systolic function was reduced.  Impressions:  - The left ventricular apex is trabeculated. There is a small dense   mass at the apex. Thrombus cannot entirely be excluded. Patient  Cardiac catheterization No angiographic evidence of a new lesion to explain non-ST elevation MI. I suspect that the elevation in troponin is related to demand ischemia with sinus tachycardia and ongoing illness.   Recommend continued medical management and titrating beta blocker as tolerated.  Consultations:  Cardiology  DISCHARGE MEDICATION: Current Discharge Medication List    START taking these  medications   Details  enoxaparin (LOVENOX) 80 MG/0.8ML injection Inject 0.8 mLs (80 mg total) into the skin every 12 (twelve) hours. Qty: 24 Syringe, Refills: 0    feeding supplement, ENSURE ENLIVE, (ENSURE ENLIVE) LIQD Take 237 mLs by mouth 2 (two) times daily between meals. Qty: 237 mL, Refills: 12    folic acid (FOLVITE) 1 MG tablet Take 1 tablet (1 mg total) by mouth daily. Qty: 30 tablet, Refills: 0    lisinopril (PRINIVIL,ZESTRIL) 2.5 MG tablet Take 1 tablet (2.5 mg total) by mouth daily. Qty: 30 tablet, Refills: 0    methocarbamol (ROBAXIN) 500 MG tablet Take 1 tablet (500 mg total) by mouth every 8 (eight) hours as needed for muscle spasms. Qty: 30 tablet, Refills: 0    oxyCODONE-acetaminophen (PERCOCET/ROXICET) 5-325 MG tablet Take 1 tablet by mouth every 4 (four) hours as needed for severe pain. Qty: 30 tablet, Refills: 0    polyethylene glycol (MIRALAX / GLYCOLAX) packet Take 17 g by mouth daily. Qty: 14 each, Refills: 0    senna-docusate (SENOKOT-S) 8.6-50 MG tablet Take 1 tablet by mouth at bedtime as needed for mild constipation. Qty: 30 tablet, Refills: 0    spironolactone (ALDACTONE) 25 MG tablet Take 0.5 tablets (12.5 mg total) by mouth daily. Qty: 30 tablet, Refills: 0    thiamine 100 MG tablet Take 1 tablet (100 mg total) by mouth daily. Qty: 30 tablet, Refills: 0  warfarin (COUMADIN) 4 MG tablet Take 1 tablet (4 mg total) by mouth daily at 6 PM. Qty: 30 tablet, Refills: 0      CONTINUE these medications which have CHANGED   Details  insulin detemir (LEVEMIR) 100 UNIT/ML injection Inject 0.1 mLs (10 Units total) into the skin at bedtime. Before breakfast Qty: 10 mL, Refills: 0      CONTINUE these medications which have NOT CHANGED   Details  carvedilol (COREG) 12.5 MG tablet TAKE ONE TABLET BY MOUTH TWICE DAILY Qty: 180 tablet, Refills: 3    DEXILANT 60 MG capsule Take 1 capsule (60 mg total) by mouth every morning. Qty: 30 capsule     escitalopram (LEXAPRO) 20 MG tablet Take 20 mg by mouth daily.     fluticasone (FLONASE) 50 MCG/ACT nasal spray Place 2 sprays into both nostrils at bedtime.     metoCLOPramide (REGLAN) 5 MG tablet Take 5 mg by mouth 3 (three) times daily before meals.    ondansetron (ZOFRAN) 4 MG tablet Take 4 mg by mouth every 8 (eight) hours as needed for nausea.    pravastatin (PRAVACHOL) 20 MG tablet Take 20 mg by mouth every evening.    SitaGLIPtin-MetFORMIN HCl (JANUMET XR) 50-1000 MG TB24 Take 1 tablet by mouth 2 (two) times daily.    sucralfate (CARAFATE) 1 GM/10ML suspension Take 1 g by mouth 4 (four) times daily. ON AN EMPTY STOMACH    aspirin EC 81 MG tablet Take 1 tablet (81 mg total) by mouth daily.    gabapentin (NEURONTIN) 400 MG capsule Take 400-800 mg by mouth See admin instructions. 400 mg in the morning then 400 mg in the evening then 800 mg at bedtime    hydroxypropyl methylcellulose (ISOPTO TEARS) 2.5 % ophthalmic solution Place 1 drop into both eyes 4 (four) times daily as needed for dry eyes.     LORazepam (ATIVAN) 1 MG tablet Take 1 mg by mouth every 6 (six) hours as needed for anxiety.     Multiple Vitamin (MULTIVITAMIN WITH MINERALS) TABS tablet Take 1 tablet by mouth daily.    nitroGLYCERIN (NITROSTAT) 0.4 MG SL tablet Place 1 tablet (0.4 mg total) under the tongue every 5 (five) minutes as needed for chest pain. (up to 3 doses) Qty: 25 tablet, Refills: 6    Omega-3 Fatty Acids (FISH OIL) 1200 MG CAPS Take 1 capsule (1,200 mg total) by mouth daily.      STOP taking these medications     amLODipine (NORVASC) 5 MG tablet      losartan (COZAAR) 50 MG tablet      naproxen (NAPROSYN) 500 MG tablet        No Known Allergies Discharge Instructions    Diet - low sodium heart healthy    Complete by:  As directed    Discharge instructions    Complete by:  As directed    It is important that you read following instructions as well as go over your medication list with  RN to help you understand your care after this hospitalization.  Discharge Instructions: Please follow-up with PCP in one week  Please request your primary care physician to go over all Hospital Tests and Procedure/Radiological results at the follow up,  Please get all Hospital records sent to your PCP by signing hospital release before you go home.   Do not take more than prescribed Pain, Sleep and Anxiety Medications. You were cared for by a hospitalist during your hospital stay. If you have any  questions about your discharge medications or the care you received while you were in the hospital after you are discharged, you can call the unit and ask to speak with the hospitalist on call if the hospitalist that took care of you is not available.  Once you are discharged, your primary care physician will handle any further medical issues. Please note that NO REFILLS for any discharge medications will be authorized once you are discharged, as it is imperative that you return to your primary care physician (or establish a relationship with a primary care physician if you do not have one) for your aftercare needs so that they can reassess your need for medications and monitor your lab values. You Must read complete instructions/literature along with all the possible adverse reactions/side effects for all the Medicines you take and that have been prescribed to you. Take any new Medicines after you have completely understood and accept all the possible adverse reactions/side effects. Wear Seat belts while driving. If you have smoked or chewed Tobacco in the last 2 yrs please stop smoking and/or stop any Recreational drug use.   Increase activity slowly    Complete by:  As directed      Discharge Exam: Filed Weights   09/09/16 0400 09/10/16 1735 09/14/16 0553  Weight: 74.7 kg (164 lb 10.9 oz) 71.7 kg (158 lb) 72.2 kg (159 lb 3.2 oz)   Vitals:   09/13/16 2101 09/14/16 0553  BP: 132/68 (!) 145/76   Pulse: 82 82  Resp: 18 18  Temp: 98.1 F (36.7 C) 98 F (36.7 C)   General: Appear in no distress, no Rash; Oral Mucosa moist. Cardiovascular: S1 and S2 Present, no Murmur, no JVD Respiratory: Bilateral Air entry present and Clear to Auscultation, no Crackles, no wheezes Abdomen: Bowel Sound present, Soft and no tenderness Extremities: no Pedal edema, no calf tenderness Neurology: Grossly no focal neuro deficit.  The results of significant diagnostics from this hospitalization (including imaging, microbiology, ancillary and laboratory) are listed below for reference.    Significant Diagnostic Studies: Dg Chest Port 1 View  Result Date: 09/09/2016 CLINICAL DATA:  Right rib pain for months.  No injury. EXAM: PORTABLE CHEST 1 VIEW COMPARISON:  09/03/2016. FINDINGS: Trachea is midline. Heart size stable. Thoracic aorta is calcified. There is left lower lobe collapse/ consolidation with bilateral pleural effusions. Old bilateral rib fractures. IMPRESSION: Bilateral pleural effusions with left lower lobe collapse/ consolidation. Electronically Signed   By: Leanna Battles M.D.   On: 09/09/2016 17:29   Dg Chest Portable 1 View  Result Date: 09/03/2016 CLINICAL DATA:  AMS/cad/chf/htn/smoker/hx heart cath/by pass surgery EXAM: PORTABLE CHEST 1 VIEW COMPARISON:  12/02/2015 FINDINGS: Shallow lung inflation. Heart size is probably normal. No focal consolidations or pleural effusions. Remote rib fractures. IMPRESSION: No evidence for acute  abnormality. Electronically Signed   By: Norva Pavlov M.D.   On: 09/03/2016 11:26   Dg Abd Acute W/chest  Result Date: 09/12/2016 CLINICAL DATA:  CP and SOB x2-3 months. RLQ pain. Lung collapse. Med Hx: Diabetes Hypertension Peptic Ulcer disease Hyperlipidemia GERD CAD CHF Carotid Artery Stenosis Surg Hx: Cardiac Cath 2017 Coronary Artery Bypass Graft (CABG) 2002 Stent placement 2012 EXAM: DG ABDOMEN ACUTE W/ 1V CHEST COMPARISON:  Chest x-rays dated  09/09/2016 and 09/03/2016. Comparison also made to a CT abdomen dated 09/27/2015. FINDINGS: Single view of the chest: Study is hypoinspiratory with crowding of the perihilar and bibasilar bronchovascular markings. Suspect mild atelectasis and/or small pleural effusions at each lung base.  Given the low lung volumes, lungs appear otherwise clear. Heart size and mediastinal contours are normal. Median sternotomy wires are stable in alignment, related to previous CABG. Supine and upright views of the abdomen: Bowel gas pattern is nonobstructive. Fairly large amount of stool noted in the right colon. No evidence of soft tissue mass or abnormal fluid collection seen. No evidence of free intraperitoneal air. Surgical sutures noted within the upper abdomen. Mild degenerative change noted within the slightly scoliotic thoracolumbar spine. No acute or suspicious osseous finding. IMPRESSION: 1. Low lung volumes. Probable mild bibasilar atelectasis and/or small pleural effusions. Lungs otherwise clear. Heart size is normal. Surgical changes of previous CABG. 2. Nonobstructive bowel gas pattern. Fairly large amount of stool in the right colon (constipation? ). Electronically Signed   By: Bary RichardStan  Maynard M.D.   On: 09/12/2016 15:47    Microbiology: No results found for this or any previous visit (from the past 240 hour(s)).   Labs: CBC:  Recent Labs Lab 09/08/16 0425 09/09/16 0400 09/10/16 0345 09/11/16 0552 09/12/16 0819 09/13/16 0656  WBC 4.2 3.9* 5.0 6.7 7.4 7.0  NEUTROABS 2.9 2.2 3.1 4.5  --   --   HGB 9.7* 9.7* 10.1* 9.4* 8.9* 9.1*  HCT 28.5* 28.5* 29.7* 28.1* 26.6* 26.9*  MCV 81.2 80.5 81.4 81.4 81.6 81.0  PLT 197 273 342 388 464* 437*   Basic Metabolic Panel:  Recent Labs Lab 09/08/16 0425 09/09/16 0400 09/10/16 0345 09/11/16 0552 09/12/16 0819 09/13/16 0656 09/14/16 1149  NA 139 138 138 139 139 135 135  K 3.6 3.4* 3.9 3.9 3.3* 3.9 3.4*  CL 101 103 103 102 100* 101 99*  CO2 28 28 23 26  30 24 26   GLUCOSE 168* 147* 154* 171* 248* 329* 271*  BUN 24* 24* 26* 23* 16 14 11   CREATININE 1.45* 1.35* 1.46* 1.39* 1.27* 1.34* 1.27*  CALCIUM 7.9* 7.9* 7.9* 8.3* 8.0* 8.1* 8.2*  MG 1.9 1.8 1.5* 1.9 1.5*  --   --   PHOS 3.6 3.7 3.7 3.1  --   --   --    Liver Function Tests:  Recent Labs Lab 09/09/16 0400 09/10/16 0345 09/11/16 0552 09/12/16 0819 09/13/16 0656  AST  --   --   --  29 32  ALT  --   --   --  27 31  ALKPHOS  --   --   --  122 125  BILITOT  --   --   --  0.5 0.6  PROT  --   --   --  4.5* 4.7*  ALBUMIN 1.5* 1.5* 2.4* 1.9* 2.0*    Recent Labs Lab 09/13/16 0656  LIPASE 12   No results for input(s): AMMONIA in the last 168 hours. Cardiac Enzymes:  Recent Labs Lab 09/07/16 1828 09/07/16 2331  TROPONINI 2.55* 1.91*   BNP (last 3 results) No results for input(s): BNP in the last 8760 hours. CBG:  Recent Labs Lab 09/13/16 1153 09/13/16 1730 09/13/16 2058 09/14/16 0831 09/14/16 1239  GLUCAP 198* 121* 378* 241* 210*   Time spent: 30 minutes  Signed:  Whitlee Sluder  [facility]  09/14/2016  , 1:49 PM

## 2016-09-14 NOTE — Clinical Social Work Placement (Signed)
   CLINICAL SOCIAL WORK PLACEMENT  NOTE  Date:  09/14/2016  Patient Details  Name: Aaron Santiago MRN: [id number] Date of Birth: Aug 13, 1946  Clinical Social Work is seeking post-discharge placement for this patient at the Skilled  Nursing Facility level of care (*CSW will initial, date and re-position this form in  chart as items are completed):  Yes   Patient/family provided with [facility] Clinical Social Work Department's list of facilities offering this level of care within the geographic area requested by the patient (or if unable, by the patient's family).  Yes   Patient/family informed of their freedom to choose among providers that offer the needed level of care, that participate in Medicare, Medicaid or managed care program needed by the patient, have an available bed and are willing to accept the patient.  Yes   Patient/family informed of [facility]'s ownership interest in [facility] and [facility], as well as of the fact that they are under no obligation to receive care at these facilities.  PASRR submitted to EDS on 09/10/16     PASRR number received on 09/10/16     Existing PASRR number confirmed on       FL2 transmitted to all facilities in geographic area requested by pt/family on 09/11/16     FL2 transmitted to all facilities within larger geographic area on       Patient informed that his/her managed care company has contracts with or will negotiate with certain facilities, including the following:        Yes   Patient/family informed of bed offers received.  Patient chooses bed at [facility]     Physician recommends and patient chooses bed at [facility]      Patient to be transferred to [facility] on 09/14/16.  Patient to be transferred to facility by [facility]     Patient family notified on 09/14/16 of transfer.  Name of family member notified:  [name], sister     PHYSICIAN Please prepare prescriptions     Additional Comment:   Clinical Social Worker facilitated patient discharge including contacting patient family and facility to confirm patient discharge plans.  Clinical information faxed to facility and family agreeable with plan.  CSW arranged ambulance transport via [facility] to [facility] .  RN to call report prior to discharge.  Clinical Social Worker will sign off for now as social work intervention is no longer needed. Please consult [place] again if new need arises.   _______________________________________________ Jamey Reas, LCSWA [phone]

## 2016-09-14 NOTE — Progress Notes (Addendum)
Physical Therapy Treatment Patient Details Name: TRASK WNEK MRN: [id number] DOB: March 19, 1946 Today's Date: 2016-09-22    History of Present Illness Pt adm with DKA. Had some chest pain and underwent cardiac cath on 10/10. 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    PT Comments    Pt making good progress with all mobility and improving cognition. Continue to feel he needs ST-SNF prior to return home.  Follow Up Recommendations  SNF     Equipment Recommendations  Other (comment) (To be assessed)    Recommendations for Other Services       Precautions / Restrictions Precautions Precautions: Fall Restrictions Weight Bearing Restrictions: No    Mobility  Bed Mobility Overal bed mobility: Needs Assistance Bed Mobility: Supine to Sit;Sit to Supine     Supine to sit: HOB elevated;Supervision Sit to supine: Supervision;HOB elevated   General bed mobility comments: Incr time and effort  Transfers Overall transfer level: Needs assistance Equipment used: Rolling walker (2 wheeled) Transfers: Sit to/from Stand Sit to Stand: Min assist         General transfer comment: Assist to bring hips up and for balance  Ambulation/Gait Ambulation/Gait assistance: Min assist Ambulation Distance (Feet): 140 Feet Assistive device: Rolling walker (2 wheeled) Gait Pattern/deviations: Step-through pattern;Decreased stride length;Drifts right/left Gait velocity: decreased Gait velocity interpretation: Below normal speed for age/gender General Gait Details: Assist for balance and cues to stay inside walker with turns.  Pt using built up shoe on left due to leg length discrepancy.   Stairs            Wheelchair Mobility    Modified Rankin (Stroke Patients Only)       Balance Overall balance assessment: Needs assistance Sitting-balance support: No upper extremity supported Sitting balance-Leahy Scale: Good     Standing balance  support: No upper extremity supported;During functional activity Standing balance-Leahy Scale: Fair                      Cognition Arousal/Alertness: Awake/alert Behavior During Therapy: WFL for tasks assessed/performed Overall Cognitive Status: Impaired/Different from baseline Area of Impairment: Safety/judgement         Safety/Judgement: Decreased awareness of safety          Exercises      General Comments        Pertinent Vitals/Pain Pain Assessment: No/denies pain    Home Living                      Prior Function            PT Goals (current goals can now be found in the care plan section) Progress towards PT goals: Progressing toward goals    Frequency    Min 3X/week      PT Plan Current plan remains appropriate    Co-evaluation             End of Session Equipment Utilized During Treatment: Gait belt Activity Tolerance: Patient tolerated treatment well Patient left: with call bell/phone within reach;in bed;with bed alarm set     Time: 1552-0802 PT Time Calculation (min) (ACUTE ONLY): 22 min  Charges:  $Gait Training: 8-22 mins                    G Codes:      Alisea Matte 09/22/16, 3:54 PM [employer] PT [phone]

## 2016-09-14 NOTE — Progress Notes (Signed)
Nutrition Brief Note  Patient identified on the Malnutrition Screening Tool (MST) Report  Wt Readings from Last 15 Encounters:  09/14/16 159 lb 3.2 oz (72.2 kg)  08/14/16 165 lb (74.8 kg)  05/12/16 166 lb 12.8 oz (75.7 kg)  12/17/15 149 lb 12.8 oz (67.9 kg)  12/02/15 142 lb 14.4 oz (64.8 kg)  11/24/15 151 lb (68.5 kg)  04/30/15 151 lb (68.5 kg)  04/10/14 161 lb 3.2 oz (73.1 kg)  06/05/13 166 lb 8 oz (75.5 kg)  02/08/13 166 lb 1.9 oz (75.4 kg)  08/10/12 166 lb 1.9 oz (75.4 kg)  02/08/12 168 lb (76.2 kg)  01/14/12 170 lb (77.1 kg)  12/08/11 174 lb 8 oz (79.2 kg)  11/07/11 164 lb (74.4 kg)   70 yo with CAD s/p CABG, ongoing ETOH abuse, cardiomyopathy, possible LV thrombus presented with DKA, altered mental status.  Echo was done, EF down to 25-30%.  LHC was done showing no culprit.  Spoke with pt at bedside. He reports good appetite currently, stating he consumed all of his breakfast. PTA he shares poor oral intake for about a month, stating "I was just in a really big mess", however, pt did not elaborate when RD attempted to probe for more information.   Pt denies weight loss. Pt reports UBW is around 160#.   Nutrition-Focused physical exam completed. Findings are no fat depletion, mild muscle depletion, and no edema. Pt reveals deficits on left lower extremities, which may be rationale for muscle depletion.  Pt reports potential to d/c to rehab today. Discussed importance of continued good PO intake to promote healing and progression with mobility.  Body mass index is 21.59 kg/m. Patient meets criteria for normal weight tange based on current BMI.   Current diet order is Heart Healthy, patient is consuming approximately 25-100% of meals at this time. Labs and medications reviewed.   No nutrition interventions warranted at this time. If nutrition issues arise, please consult RD.   Netra Postlethwait A. Mayford Knife, RD, LDN, CDE Pager: [phone] After hours Pager: [phone]

## 2016-09-14 NOTE — Care Management Note (Signed)
Case Management Note  Patient Details  Name: Aaron Santiago MRN: [id number] Date of Birth: [birth year]  Subjective/Objective:                    Action/Plan:  DC to SNF today after labs resulted.   Expected Discharge Date:                  Expected Discharge Plan:  Skilled Nursing Facility  In-House Referral:  Clinical Social Work  Discharge planning Services  CM Consult  Post Acute Care Choice:  NA Choice offered to:  NA  DME Arranged:  N/A DME Agency:  NA  HH Arranged:  NA HH Agency:  NA  Status of Service:  Completed, signed off  If discussed at Long Length of Stay Meetings, dates discussed:    Additional Comments:  Lawerance Sabal, RN 09/14/2016, 12:15 PM

## 2016-09-14 NOTE — Progress Notes (Signed)
ANTICOAGULATION CONSULT NOTE Pharmacy Consult for Lovenox and warfarin Indication:  LV thrombus  No Known Allergies  Patient Measurements: Height: 6' (182.9 cm) Weight: 159 lb 3.2 oz (72.2 kg) IBW/kg (Calculated) : 77.6   Vital Signs: Temp: 98 F (36.7 C) (10/16 0553) Temp Source: Oral (10/16 0553) BP: 145/76 (10/16 0553) Pulse Rate: 82 (10/16 0553)  Labs:  Recent Labs  09/12/16 0819 09/13/16 0656 09/13/16 1521 09/14/16 0814 09/14/16 1149  HGB 8.9* 9.1*  --   --   --   HCT 26.6* 26.9*  --   --   --   PLT 464* 437*  --   --   --   HEPARINUNFRC 0.40 0.29* 0.35 0.51  --   CREATININE 1.27* 1.34*  --   --  1.27*    Assessment: 70 y.o. male with LV thrombus to transition from heparin infusion to lovenox while bridging to warfarin.  Warfarin started on 10/15 but have been unable to obtain an INR today. Previous CBC stable. Noted plans to transfer to SNF if stable.    Goal of Therapy:  Monitor platelets by anticoagulation protocol: Yes   Plan:  1. Begin Lovenox 70 mg subcutaneously every 12 hours  2. Repeat warfarin 4 mg x 1 tonight 3. If remains inpatient will obtain INR in am 4. CBC every 72 hours    Pollyann Samples, PharmD, BCPS 09/14/2016, 1:23 PM Pager: [phone]

## 2016-09-14 NOTE — Progress Notes (Addendum)
SUBJECTIVE: No chest pain or SOB. He has right sided abdominal pain.    BP (!) 145/76 (BP Location: Left Leg)   Pulse 82   Temp 98 F (36.7 C) (Oral)   Resp 18   Ht 6' (1.829 m)   Wt 159 lb 3.2 oz (72.2 kg)   SpO2 96%   BMI 21.59 kg/m   Intake/Output Summary (Last 24 hours) at 09/14/16 0849 Last data filed at 09/14/16 0653  Gross per 24 hour  Intake          1448.83 ml  Output              300 ml  Net          1148.83 ml    PHYSICAL EXAM General: Well developed, well nourished, in no acute distress. Alert and oriented x 3.  Psych:  Good affect, responds appropriately Neck: No JVD. No masses noted.  Lungs: Clear bilaterally with no wheezes or rhonci noted.  Heart: RRR with no murmurs noted. Abdomen: Bowel sounds are present. TTP right upper quadrant. Soft.   Extremities: No lower extremity edema.   LABS: Basic Metabolic Panel:  Recent Labs  41/32/44 0819 09/13/16 0656  NA 139 135  K 3.3* 3.9  CL 100* 101  CO2 30 24  GLUCOSE 248* 329*  BUN 16 14  CREATININE 1.27* 1.34*  CALCIUM 8.0* 8.1*  MG 1.5*  --    CBC:  Recent Labs  09/12/16 0819 09/13/16 0656  WBC 7.4 7.0  HGB 8.9* 9.1*  HCT 26.6* 26.9*  MCV 81.6 81.0  PLT 464* 437*   Fasting Lipid Panel:  Recent Labs  09/13/16 0656  CHOL 87  HDL 31*  LDLCALC 44  TRIG 62  CHOLHDL 2.8    Current Meds: . aspirin EC  81 mg Oral Daily  . carvedilol  6.25 mg Oral BID WC  . escitalopram  20 mg Oral Daily  . feeding supplement (ENSURE ENLIVE)  237 mL Oral BID BM  . folic acid  1 mg Oral Daily  . gabapentin  400 mg Oral BID  . gabapentin  800 mg Oral QHS  . insulin aspart  0-9 Units Subcutaneous TID WC  . insulin detemir  10 Units Subcutaneous Q24H  . lisinopril  2.5 mg Oral Daily  . methocarbamol  500 mg Oral TID  . multivitamin with minerals  1 tablet Oral Daily  . pantoprazole  40 mg Oral Daily  . polyethylene glycol  17 g Oral Daily  . pravastatin  20 mg Oral q1800  . senna-docusate  1  tablet Oral BID  . spironolactone  12.5 mg Oral Daily  . sucralfate  1 g Oral TID AC  . thiamine  100 mg Oral Daily   Or  . thiamine  100 mg Intravenous Daily  . Warfarin - Pharmacist Dosing Inpatient   Does not apply q1800     ASSESSMENT AND PLAN: 70 yo with CAD s/p CABG, ongoing ETOH abuse, cardiomyopathy, possible LV thrombus presented with DKA, altered mental status.  Echo was done, EF down to 25-30%.  LHC was done showing no culprit.  1. CAD: Elevated troponin in setting of DKA. Cardiac cath showed no new lesions to explain NSTEMI or fall in LVEF.  His coronary anatomy is similar to the past.  Suspect demand ischemia.  - Continue ASA, statin.  Lipids are under good control.  2. Ischemic and non-ischemic cardiomyopathy: LVEF=25-30% on echo. Coronary angiography did not show  any changes to explain change in LVEF. This may be a combination of mixed ischemic/nonischemic cardiomyopathy.  May be a component of ETOH cardiomyopathy with heavy drinking. Also, cannot rule out stress cardiomyopathy in setting of medical illness (DKA).  He is not volume overloaded on exam.  - Continue Coreg, Ace-inh, spironolactone.    - He does not need Lasix right now.   3.  LV thrombus: Limited echo with Definity suggests an apical mass. He is on heparin gtt. He has been started on warfarin. He will need a therapeutic INR before discharge.     Verne Carrowhristopher Jadis Pitter  10/16/20178:49 AM

## 2016-09-14 NOTE — Care Management Important Message (Signed)
Important Message  Patient Details  Name: Aaron Santiago MRN: [id number] Date of Birth: Apr 25, 1946   Medicare Important Message Given:  Yes    Friend Dorfman Abena 09/14/2016, 2:01 PM

## 2016-09-30 ENCOUNTER — Ambulatory Visit: Payer: Self-pay | Admitting: Physician Assistant

## 2016-09-30 DEATH — deceased

## 2016-10-17 IMAGING — DX DG CHEST 2V
2 series · 2 of 2 positions shown · non-contrast
Comparison: 06/05/2013

CLINICAL DATA: Chest pain.

EXAM:
CHEST  2 VIEW

[chest pa]
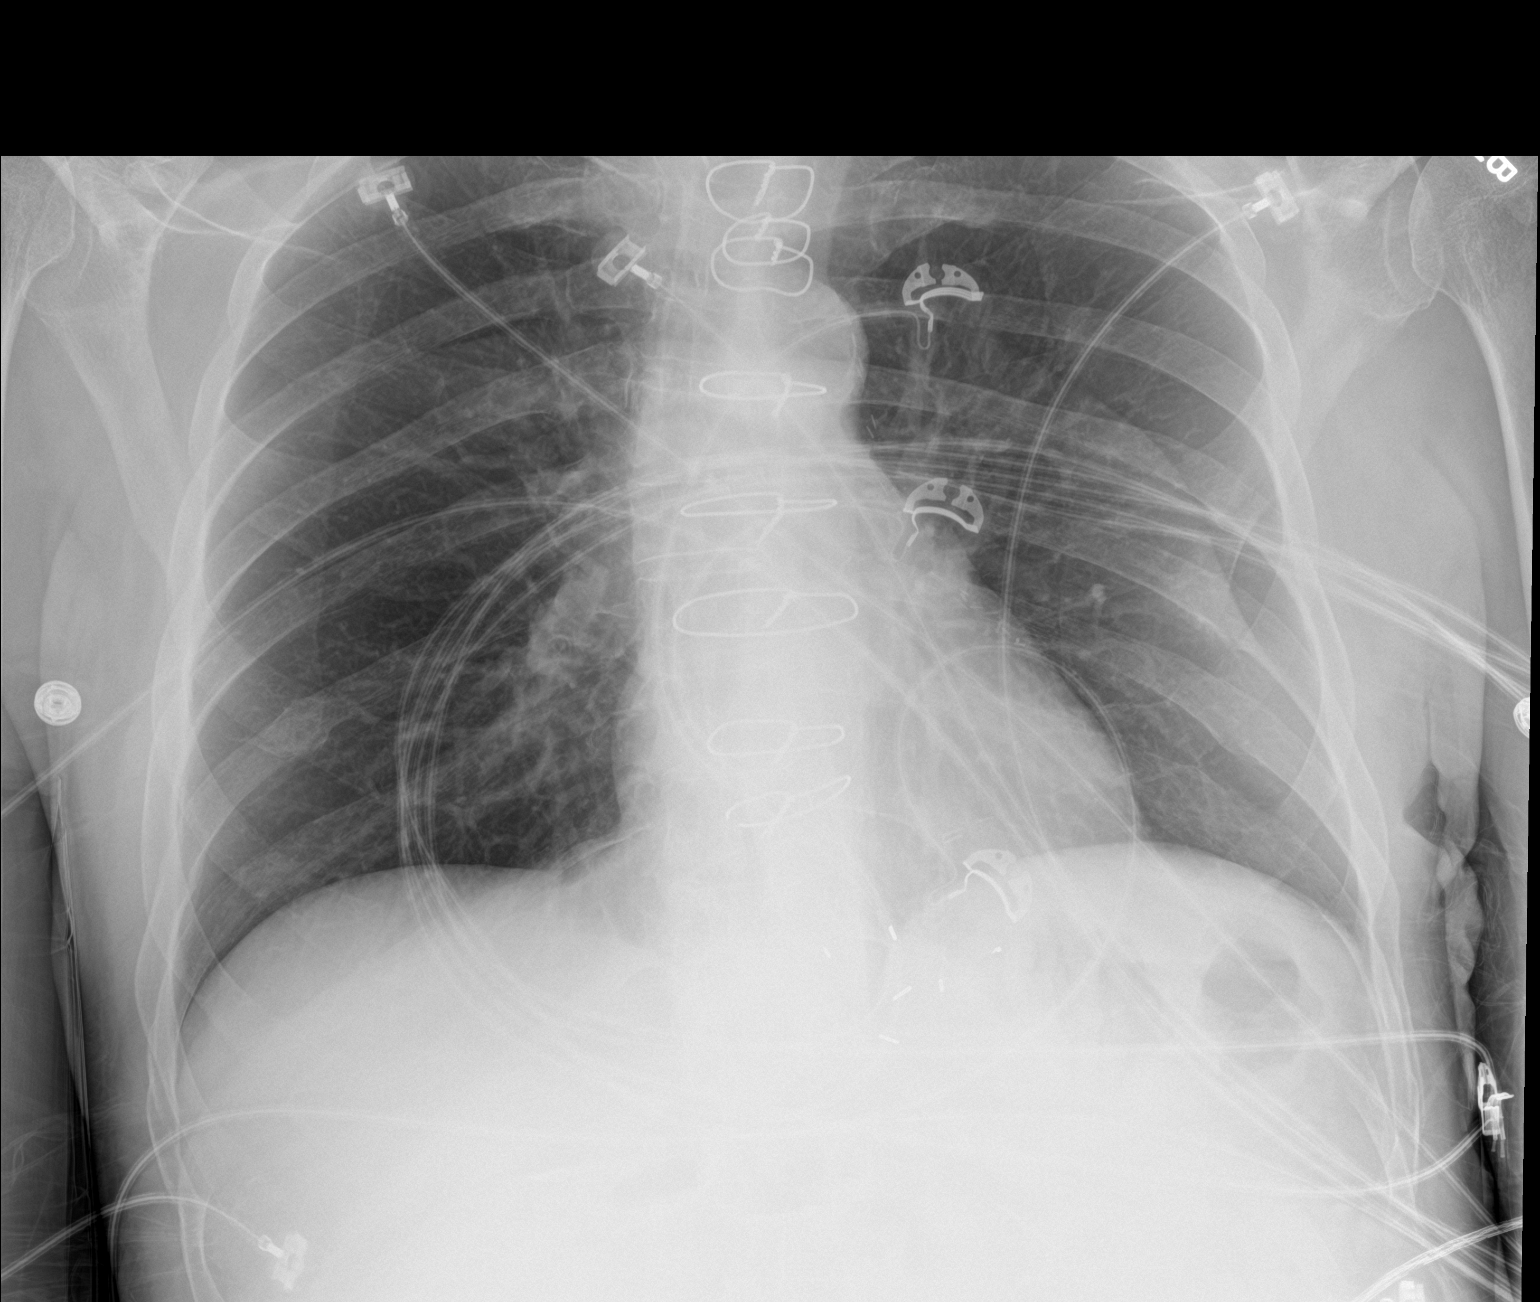

[chest lat]
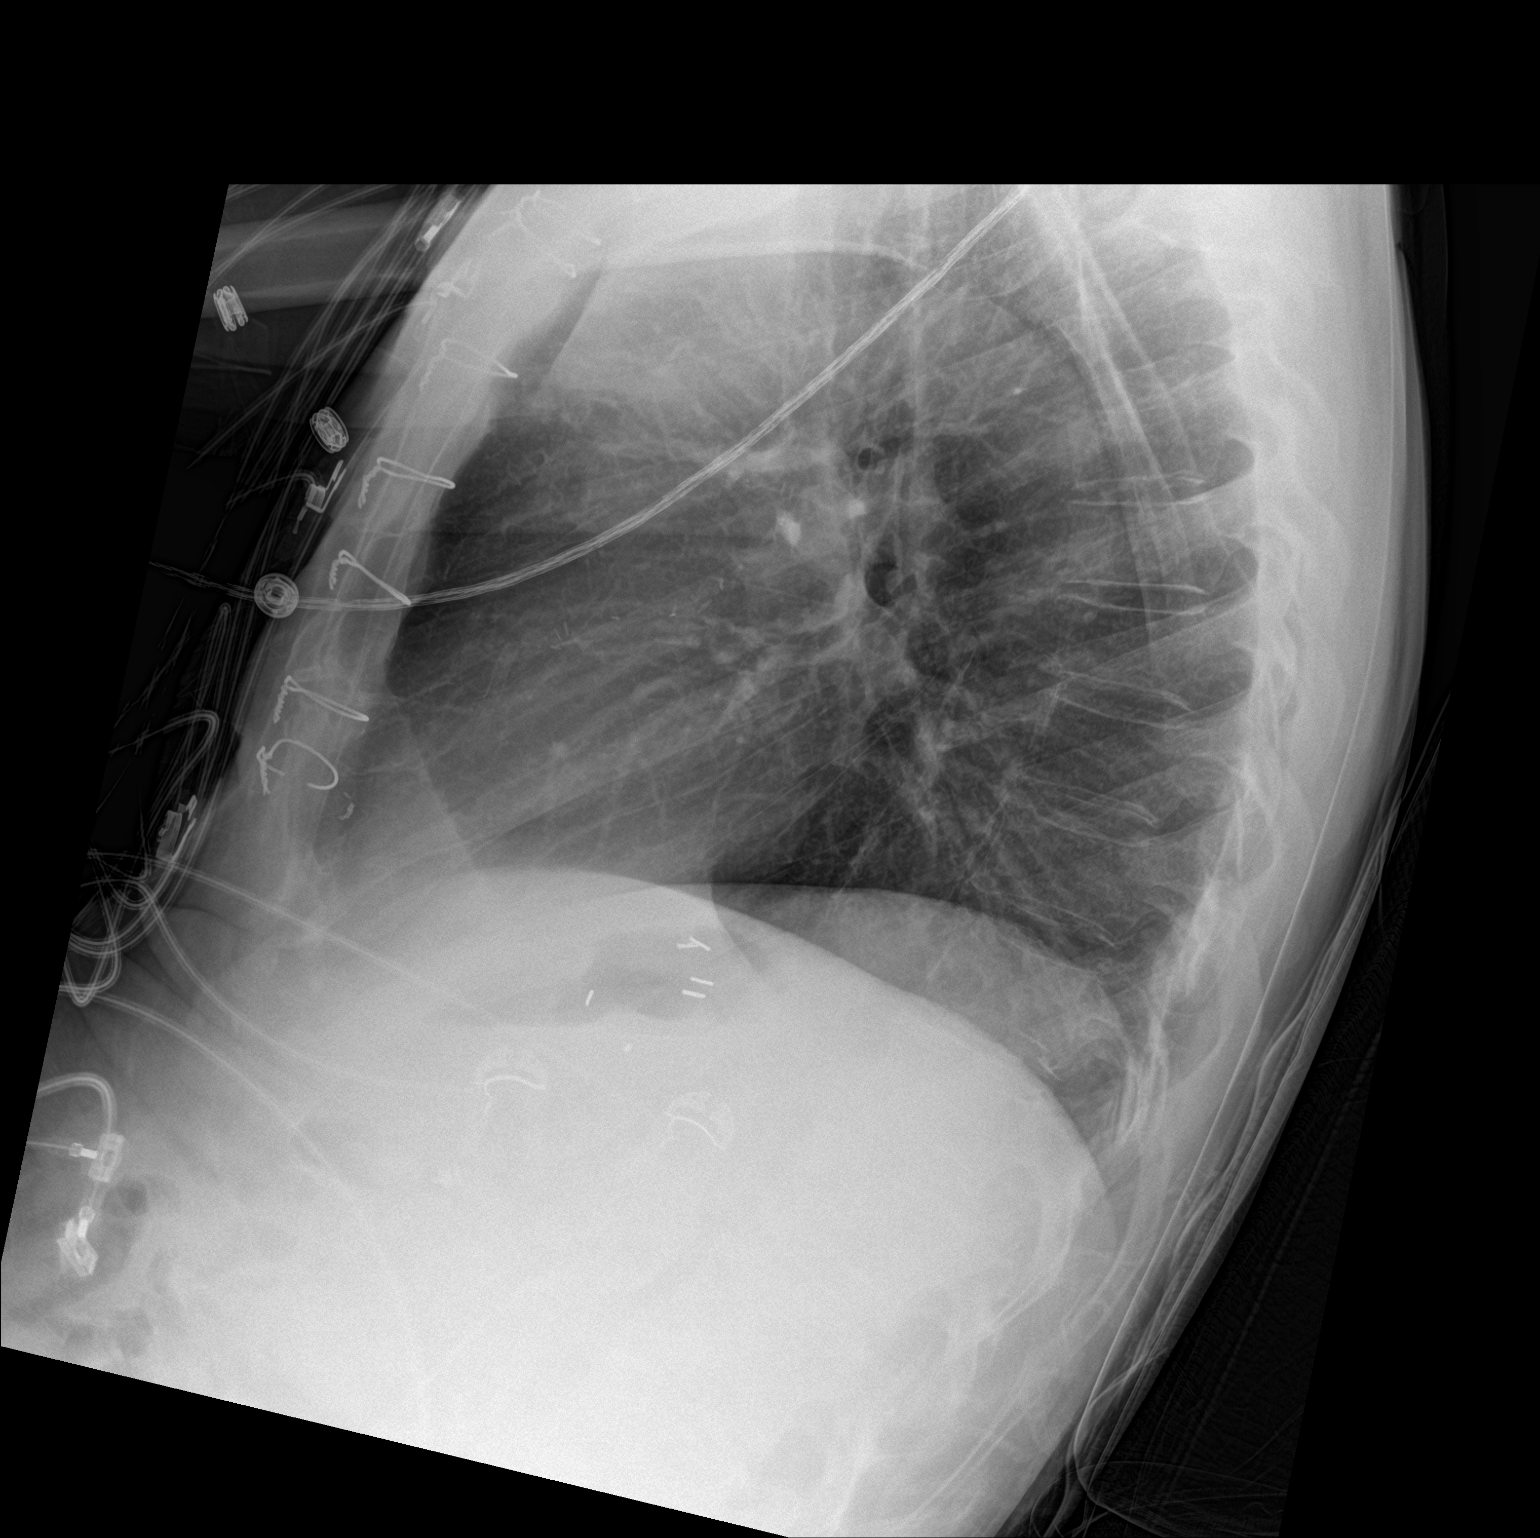

[2 of 2 positions shown; findings below may reference images not displayed]

FINDINGS: Heart size and pulmonary vascularity are normal and the lungs are
clear. No effusions. Previous median sternotomy. Surgical clips at
the GE junction, probably due to hiatal hernia repair.

Calcification in the thoracic aorta. No significant osseous
abnormality.
IMPRESSION: No acute disease.  Aortic atherosclerosis.

## 2017-07-29 IMAGING — DX DG ABDOMEN ACUTE W/ 1V CHEST
3 series · 3 of 3 positions shown · non-contrast
Comparison: Chest x-rays dated 09/09/2016 and 09/03/2016.
Comparison also made to a CT abdomen dated 09/27/2015.

CLINICAL DATA: CP and SOB x2-3 months. RLQ pain. Lung collapse. Med
Hx: Diabetes Hypertension Peptic Ulcer disease Hyperlipidemia GERD
CAD CHF Carotid Artery Stenosis Surg Hx: Cardiac Cath 4359 Coronary
Artery Bypass Graft (CABG) 7447 Stent placement 4074

EXAM:
DG ABDOMEN ACUTE W/ 1V CHEST

[chest pa]
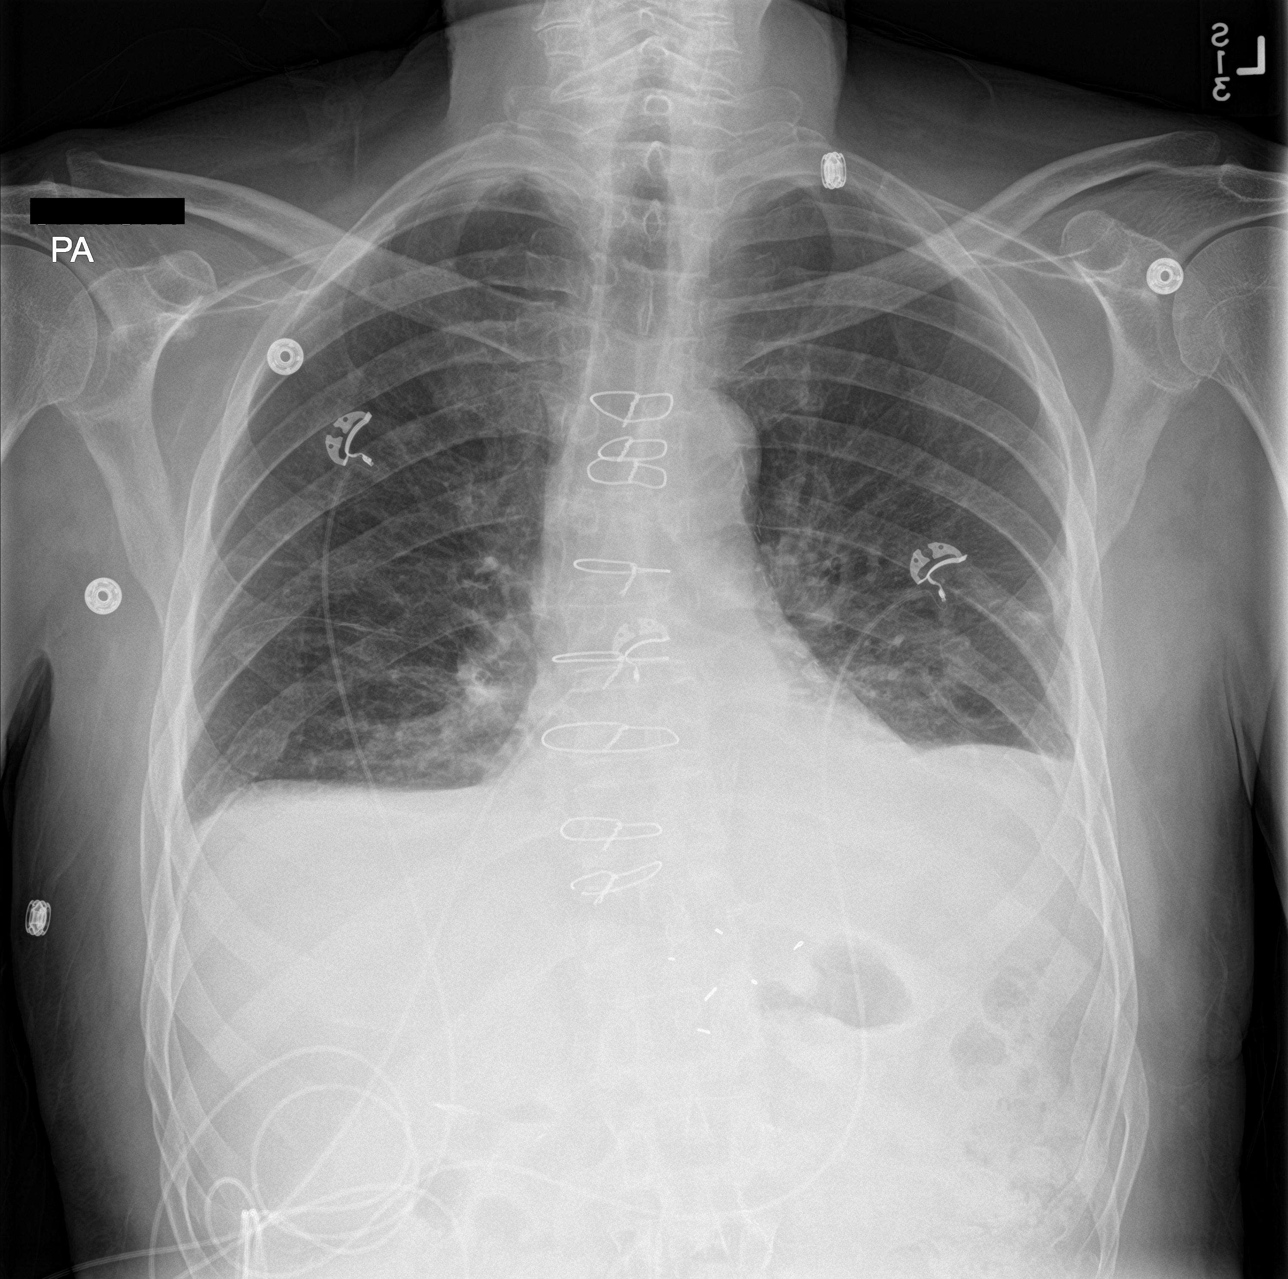

[abdomen erect]
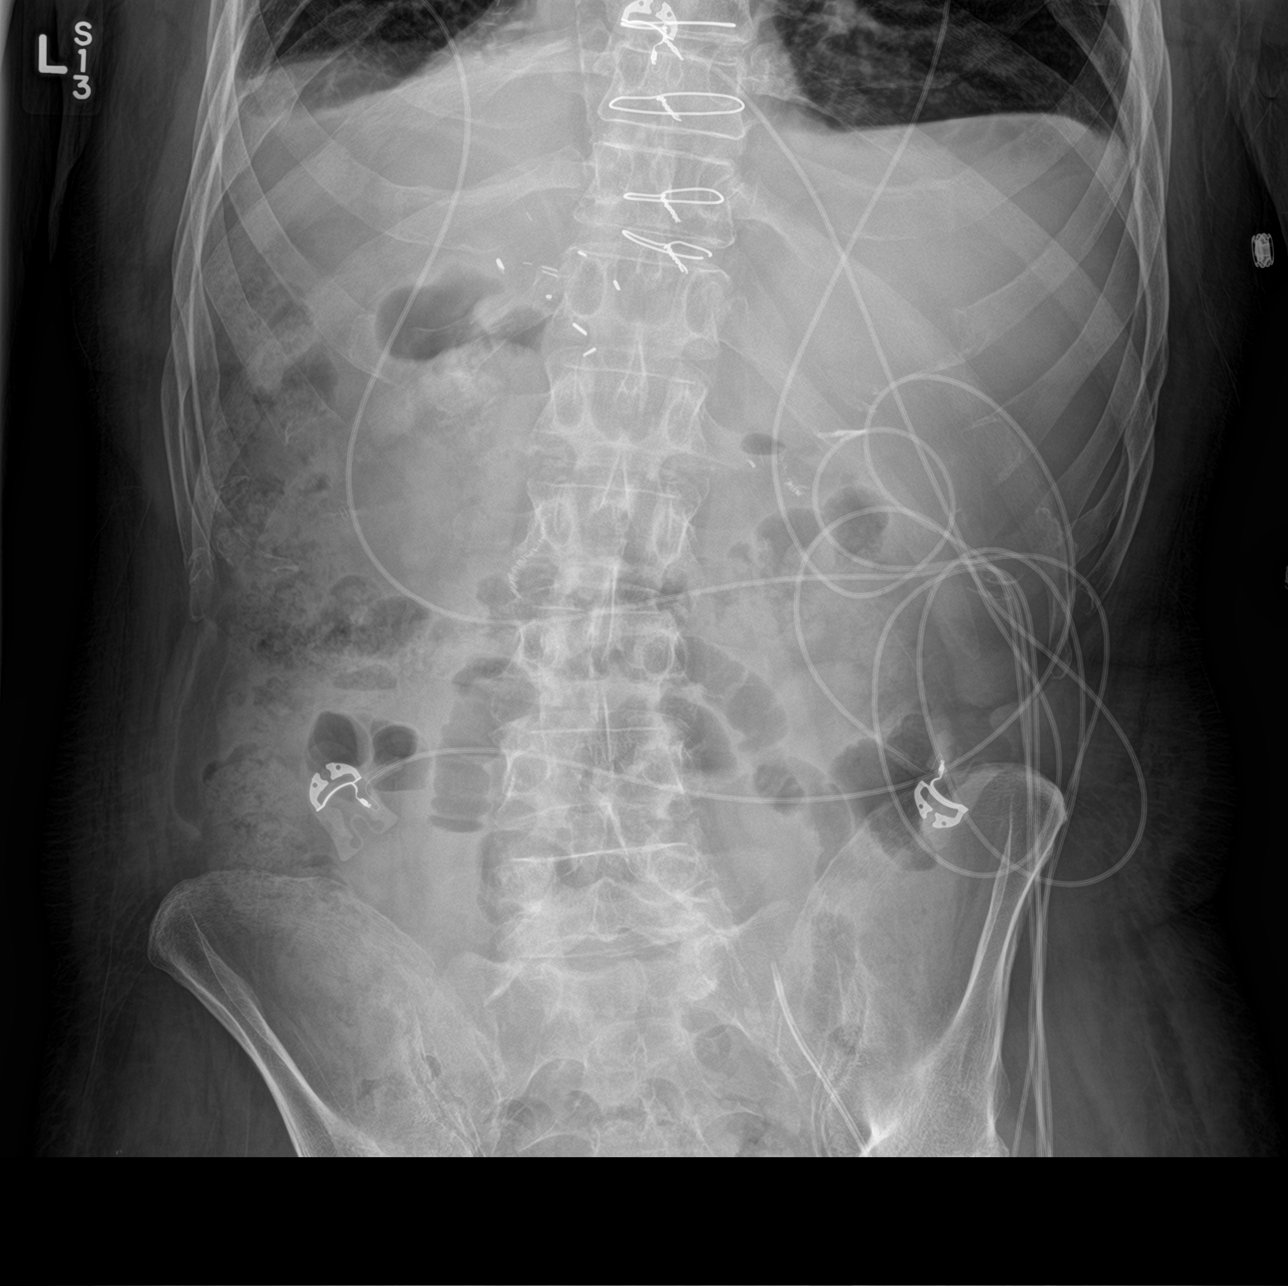

[abdomen supine]
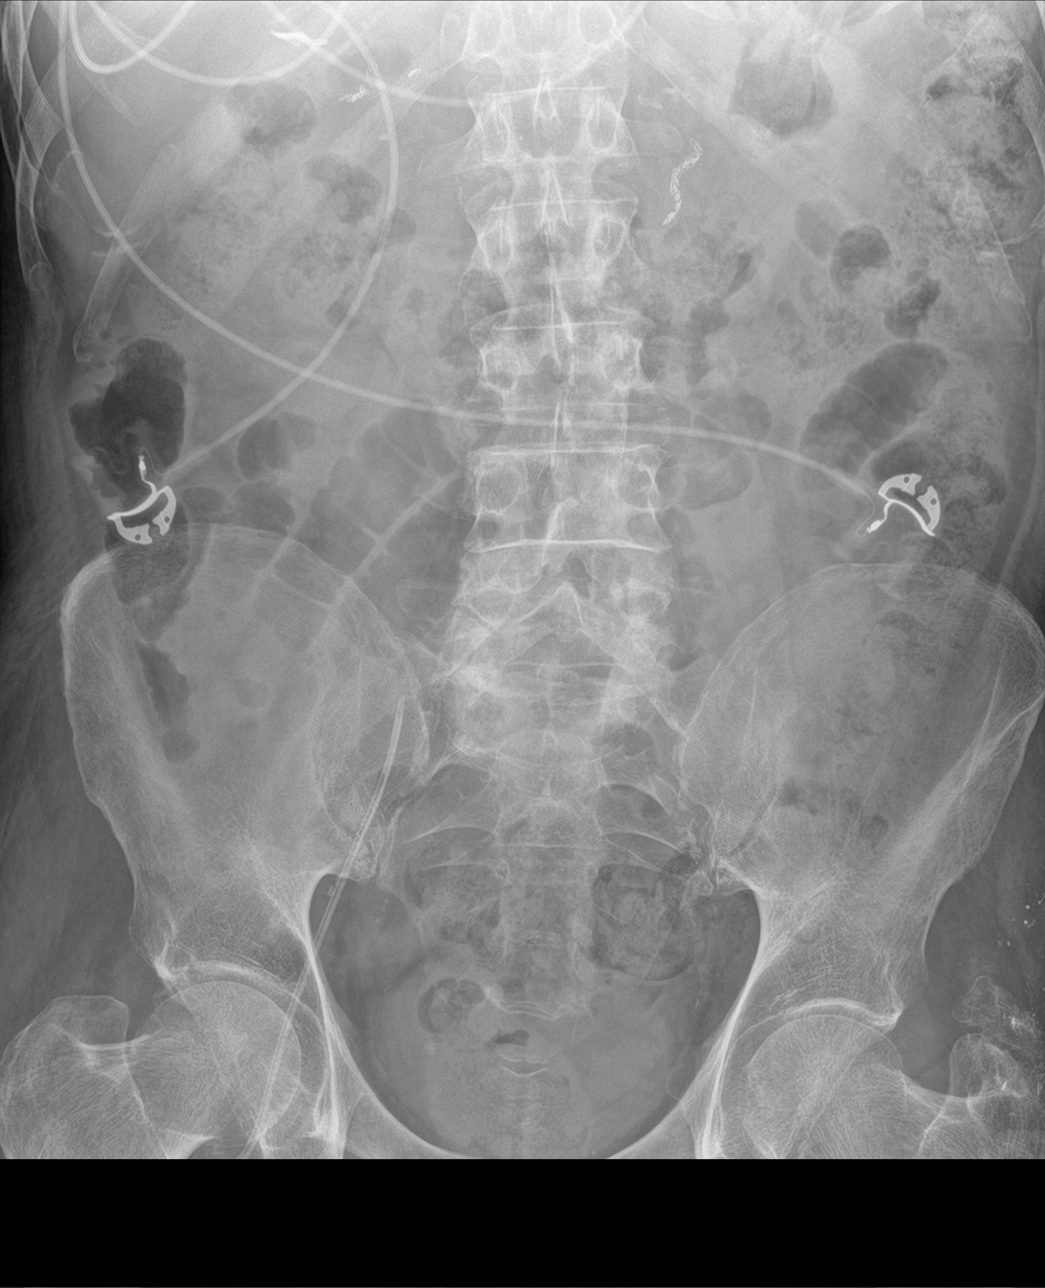

[3 of 3 positions shown; findings below may reference images not displayed]

FINDINGS: Single view of the chest: Study is hypoinspiratory with crowding of
the perihilar and bibasilar bronchovascular markings. Suspect mild
atelectasis and/or small pleural effusions at each lung base. Given
the low lung volumes, lungs appear otherwise clear. Heart size and
mediastinal contours are normal. Median sternotomy wires are stable
in alignment, related to previous CABG.

Supine and upright views of the abdomen: Bowel gas pattern is
nonobstructive. Fairly large amount of stool noted in the right
colon. No evidence of soft tissue mass or abnormal fluid collection
seen. No evidence of free intraperitoneal air. Surgical sutures
noted within the upper abdomen.

Mild degenerative change noted within the slightly scoliotic
thoracolumbar spine. No acute or suspicious osseous finding.
IMPRESSION: 1. Low lung volumes. Probable mild bibasilar atelectasis and/or
small pleural effusions. Lungs otherwise clear. Heart size is
normal. Surgical changes of previous CABG.
2. Nonobstructive bowel gas pattern. Fairly large amount of stool in
the right colon (constipation? ).
